# Patient Record
Sex: Male | Born: 1945
Health system: Southern US, Academic
[De-identification: ages and names within clinical notes are randomized; demographics above are authoritative.]

## PROBLEM LIST (undated history)

## (undated) DIAGNOSIS — R634 Abnormal weight loss: Secondary | ICD-10-CM

## (undated) DIAGNOSIS — R197 Diarrhea, unspecified: Secondary | ICD-10-CM

## (undated) DIAGNOSIS — I10 Essential (primary) hypertension: Secondary | ICD-10-CM

## (undated) DIAGNOSIS — E7800 Pure hypercholesterolemia, unspecified: Secondary | ICD-10-CM

## (undated) DIAGNOSIS — Z9889 Other specified postprocedural states: Secondary | ICD-10-CM

## (undated) DIAGNOSIS — K589 Irritable bowel syndrome without diarrhea: Secondary | ICD-10-CM

## (undated) DIAGNOSIS — R0989 Other specified symptoms and signs involving the circulatory and respiratory systems: Secondary | ICD-10-CM

## (undated) DIAGNOSIS — M25551 Pain in right hip: Secondary | ICD-10-CM

## (undated) DIAGNOSIS — M1990 Unspecified osteoarthritis, unspecified site: Secondary | ICD-10-CM

## (undated) DIAGNOSIS — M79672 Pain in left foot: Secondary | ICD-10-CM

## (undated) DIAGNOSIS — M542 Cervicalgia: Secondary | ICD-10-CM

## (undated) DIAGNOSIS — C801 Malignant (primary) neoplasm, unspecified: Secondary | ICD-10-CM

## (undated) DIAGNOSIS — I2510 Atherosclerotic heart disease of native coronary artery without angina pectoris: Secondary | ICD-10-CM

## (undated) DIAGNOSIS — R899 Unspecified abnormal finding in specimens from other organs, systems and tissues: Secondary | ICD-10-CM

## (undated) DIAGNOSIS — F419 Anxiety disorder, unspecified: Secondary | ICD-10-CM

## (undated) DIAGNOSIS — N400 Enlarged prostate without lower urinary tract symptoms: Secondary | ICD-10-CM

## (undated) DIAGNOSIS — R0789 Other chest pain: Secondary | ICD-10-CM

## (undated) DIAGNOSIS — R5383 Other fatigue: Secondary | ICD-10-CM

## (undated) DIAGNOSIS — F32A Depression, unspecified: Secondary | ICD-10-CM

## (undated) DIAGNOSIS — D72829 Elevated white blood cell count, unspecified: Secondary | ICD-10-CM

## (undated) DIAGNOSIS — I7090 Unspecified atherosclerosis: Secondary | ICD-10-CM

## (undated) DIAGNOSIS — K219 Gastro-esophageal reflux disease without esophagitis: Secondary | ICD-10-CM

## (undated) DIAGNOSIS — R202 Paresthesia of skin: Secondary | ICD-10-CM

## (undated) DIAGNOSIS — E041 Nontoxic single thyroid nodule: Secondary | ICD-10-CM

## (undated) DIAGNOSIS — G4733 Obstructive sleep apnea (adult) (pediatric): Secondary | ICD-10-CM

## (undated) DIAGNOSIS — M79659 Pain in unspecified thigh: Secondary | ICD-10-CM

## (undated) DIAGNOSIS — M5116 Intervertebral disc disorders with radiculopathy, lumbar region: Secondary | ICD-10-CM

## (undated) DIAGNOSIS — G47419 Narcolepsy without cataplexy: Secondary | ICD-10-CM

## (undated) DIAGNOSIS — R972 Elevated prostate specific antigen [PSA]: Secondary | ICD-10-CM

## (undated) HISTORY — DX: Benign prostatic hyperplasia without lower urinary tract symptoms: N40.0

## (undated) HISTORY — DX: Other chest pain: R07.89

## (undated) HISTORY — PX: HX COLONOSCOPY: 2100001147

## (undated) HISTORY — DX: Elevated white blood cell count, unspecified: D72.829

## (undated) HISTORY — DX: Gastro-esophageal reflux disease without esophagitis: K21.9

## (undated) HISTORY — PX: HX LAP CHOLECYSTECTOMY: SHX56

## (undated) HISTORY — DX: Other fatigue: R53.83

## (undated) HISTORY — DX: Pure hypercholesterolemia, unspecified: E78.00

## (undated) HISTORY — DX: Intervertebral disc disorders with radiculopathy, lumbar region: M51.16

## (undated) HISTORY — DX: Anxiety disorder, unspecified: F41.9

## (undated) HISTORY — DX: Pain in right hip: M25.551

## (undated) HISTORY — DX: Unspecified abnormal finding in specimens from other organs, systems and tissues: R89.9

## (undated) HISTORY — DX: Cervicalgia: M54.2

## (undated) HISTORY — PX: DENTAL SURGERY: SHX609

## (undated) HISTORY — DX: Obstructive sleep apnea (adult) (pediatric): G47.33

## (undated) HISTORY — DX: Paresthesia of skin: R20.2

## (undated) HISTORY — DX: Pain in unspecified thigh: M79.659

## (undated) HISTORY — PX: ORTHOPEDIC SURGERY: SHX850

## (undated) HISTORY — DX: Unspecified atherosclerosis: I70.90

## (undated) HISTORY — DX: Other specified symptoms and signs involving the circulatory and respiratory systems: R09.89

## (undated) HISTORY — DX: Pain in left foot: M79.672

## (undated) HISTORY — PX: HX HERNIA REPAIR: SHX51

## (undated) HISTORY — DX: Abnormal weight loss: R63.4

## (undated) HISTORY — DX: Elevated prostate specific antigen (PSA): R97.20

## (undated) HISTORY — PX: HX CORONARY ARTERY BYPASS GRAFT: SHX141

## (undated) HISTORY — DX: Unspecified osteoarthritis, unspecified site: M19.90

## (undated) HISTORY — DX: Atherosclerotic heart disease of native coronary artery without angina pectoris: I25.10

## (undated) HISTORY — DX: Depression, unspecified: F32.A

## (undated) HISTORY — DX: Diarrhea, unspecified: R19.7

## (undated) HISTORY — PX: HX BACK SURGERY: SHX140

## (undated) HISTORY — DX: Essential (primary) hypertension: I10

## (undated) HISTORY — DX: Narcolepsy without cataplexy: G47.419

## (undated) HISTORY — DX: Nontoxic single thyroid nodule: E04.1

## (undated) HISTORY — DX: Other specified postprocedural states: Z98.890

## (undated) HISTORY — DX: Irritable bowel syndrome, unspecified: K58.9

---

## 1993-01-21 ENCOUNTER — Inpatient Hospital Stay (HOSPITAL_COMMUNITY): Payer: Self-pay

## 2009-10-26 ENCOUNTER — Ambulatory Visit
Admission: RE | Admit: 2009-10-26 | Discharge: 2009-10-26 | Disposition: A | Discharge status: Home / Self Care | Payer: Self-pay | Attending: Dermatology | Admitting: Dermatology

## 2009-10-27 NOTE — Progress Notes (Signed)
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  He is being referred by Dr. Illene Bolus of [place], [place], for Mohs micrographic surgery.    NAME OF PROCEDURE:  Mohs micrographic surgery in order to excise a basal cell carcinoma from the left ear with possible reconstruction of the defect.    POSTOPERATIVE DIAGNOSIS:  Three layers of Mohs micrographic surgery in order to excise a basal cell carcinoma from the left ear and an incidental squamous cell carcinoma in situ followed by reconstruction of the defect with a full-thickness skin graft harvested from the left supraclavicular area.     OPERATIVE PROCEDURE:  Prior to surgery, the patient was advised of the risks, benefits, alternative methods of treatment and consequences of lack of treatment.  He said he understood the above and was willing to undergo the following procedure.  On Oct 26, 2009, at 7:56 a.m., a timeout was taken.  The patient identified himself.  I, with the patient's assistance and staff being present, identified the surgical site.  It was a 10 x 9-mm sclerotic area that had previously been biopsied and was diagnosed as a basal cell carcinoma.  He indicated he wished to undergo the Mohs procedure.  Therefore, following sterile prep and a total of 10 mL of 1% Xylocaine with 1:100,000 epinephrine, the following procedure was performed.  There was also preparation of anesthetic, which was included to the left supraclavicular area, which will be the future donor site for a full-thickness skin graft.  The area of gross tumor involvement was excised with a 1-3 mm margin of tissue as indicated on the map in the chart.  After the first layer of Mohs micrographic surgery, there was evidence of some scar tissue and basal cell carcinoma as indicated on the map in the chart.  These 2 areas were reexcised in 1 specimen in the second layer with a 1-3 mm margin of tissue.  After the second layer of Mohs micrographic surgery the basal cell carcinoma had resolved, but there was a focus of squamous cell carcinoma in situ as indicated on the map in the chart.  This area was reexcised in layer #3 with less than a 1-3 mm margin of tissue.  After the third layer of Mohs micrographic surgery, there were some mild actinic keratotic changes.  There was no evidence of residual squamous cell carcinoma or in situ or basal cell carcinoma.  I explained the significance of subclinical identification of actinic keratotic changes to the patient.  He agreed to be followed.  Therefore, hemostasis was obtained and the 22 x 18-mm defect was reconstructed with a  full-thickness skin graft.  A template of Telfa was made of the defect.  The template was placed over the left supraclavicular area.  This outlined area was incised and defatted and pie  crusted in 3 areas.  The graft was then trimmed to fit into place and sutured to the wound bed over the left ear defect with three 6-0 fast-absorbing gut basting sutures.  The periphery of the graft was sutured into place with running 6-0 nylon.  Following this, bacitracin ointment was placed over the graft followed by a bolster of Xeroform gauze, which was secured with 5-0 nylon in a crisscross pattern.  The donor site was approximated by wide undermining and approximation with multiple 4-0 buried Vicryl sutures.  The surface was closed with running 6-0 fast-absorbing gut sutures.  The procedure was performed without incident.  The patient is to restrain from any strenuous activity.  He will see Dr. Rebecca Eaton in approximately 5-7 days for his bolster removal.    I performed the procedure with the assistance of Dr. Laveda Norman.  I was present during the entire procedure and I dictated the note and I agree with the above.    SURGEON:  Evalee Mutton, MD.    ASSISTANTS:  Laveda Norman MD, Victoriano Lain RN and Amanda Cockayne HT (ASCP).      Evalee Mutton, MD  Professor  [facility] Department of Medicine    [id number]; D: 10/26/2009 12:15:03 T: 10/27/2009 08:16:07

## 2016-07-05 IMAGING — CR XRAY KNEE 3 VIEWS LT
1 series · 3 of 3 positions shown · non-contrast
Comparison: None.

Exam:   

Left knee 3V
INDICATION: Pain.

[Series 4: view not recorded · 0.17mm/px · 3 of 3 slices shown]
[im 1/3]
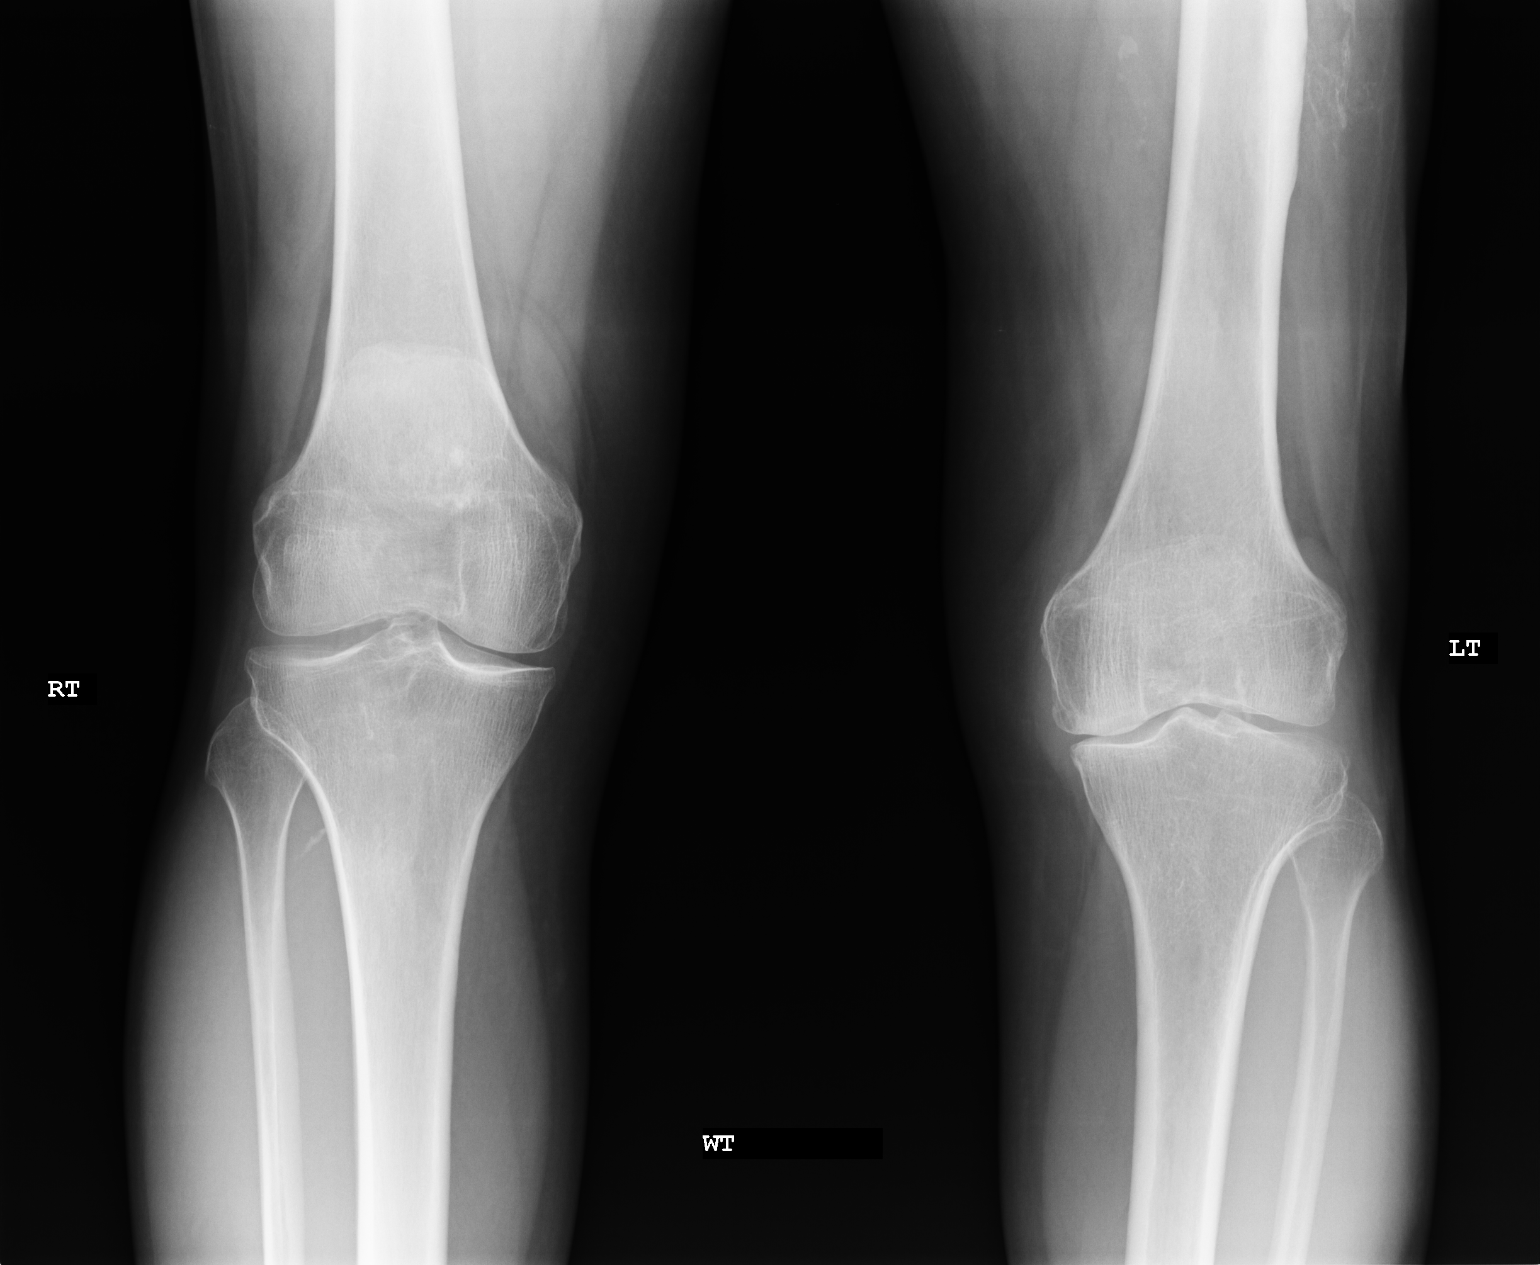
[im 2/3]
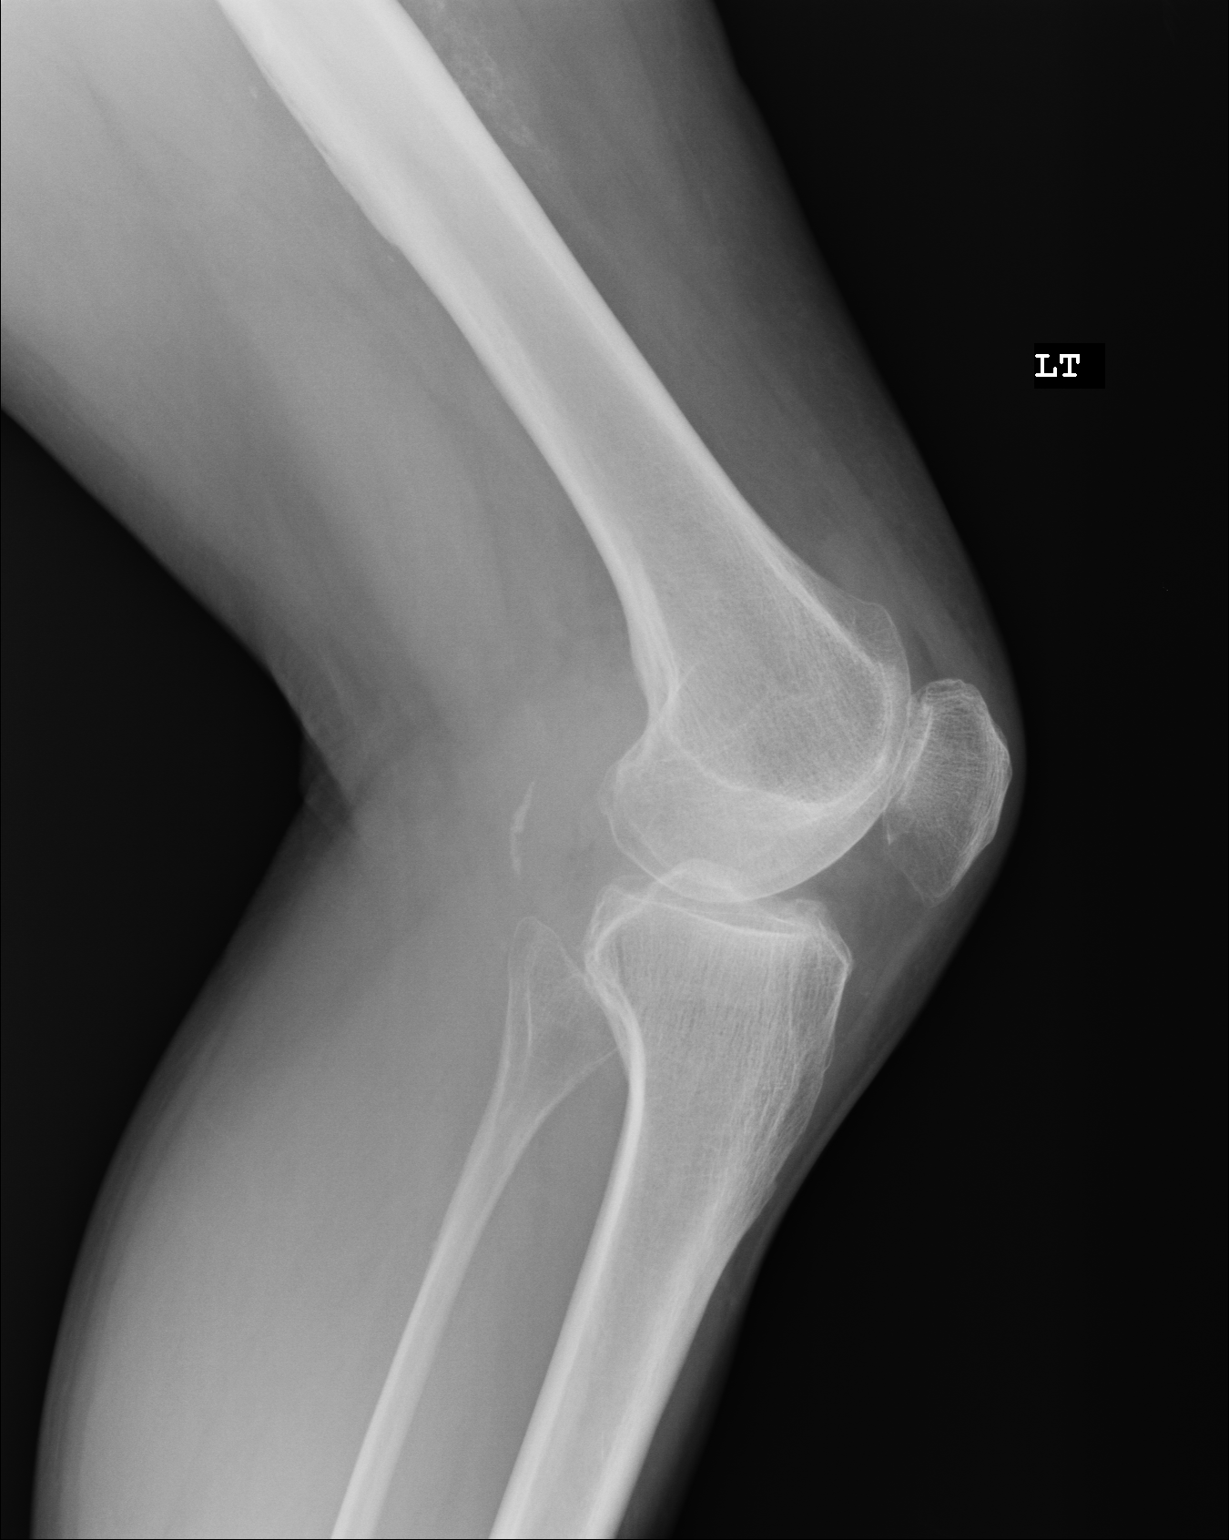
[im 3/3]
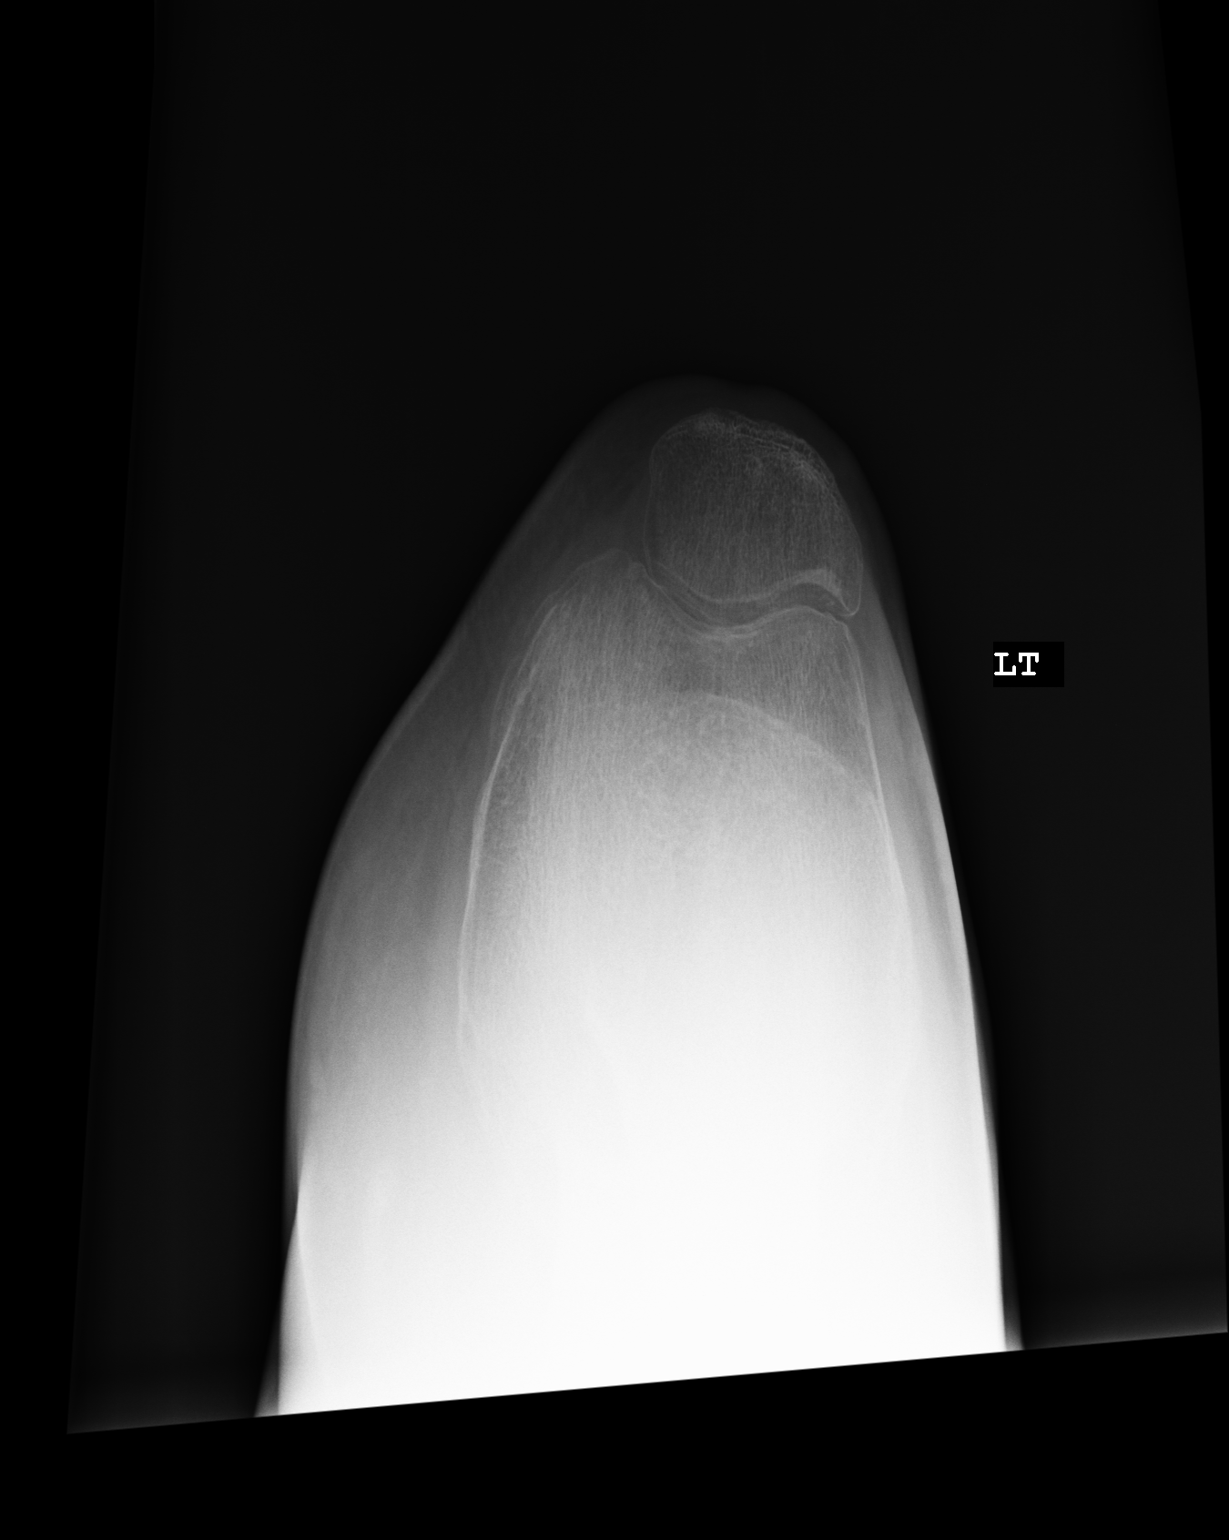

[3 of 3 positions shown; findings below may reference images not displayed]

FINDINGS: There is no acute fracture or subluxation. There is moderate narrowing of the medial tibiofemoral articulation and mild narrowing of the patellofemoral joint. There is no suprapatellar effusion. There is no soft tissue abnormality.
IMPRESSION: Degenerative changes as detailed above.

## 2016-07-05 IMAGING — CR XRAY KNEE 3 VIEWS RT
1 series · 3 of 3 positions shown · non-contrast
Comparison: 

Exam:   

Right knee 3V
INDICATION: Pain.

[Series 1: view not recorded · 0.17mm/px · 3 of 3 slices shown]
[im 1/3]
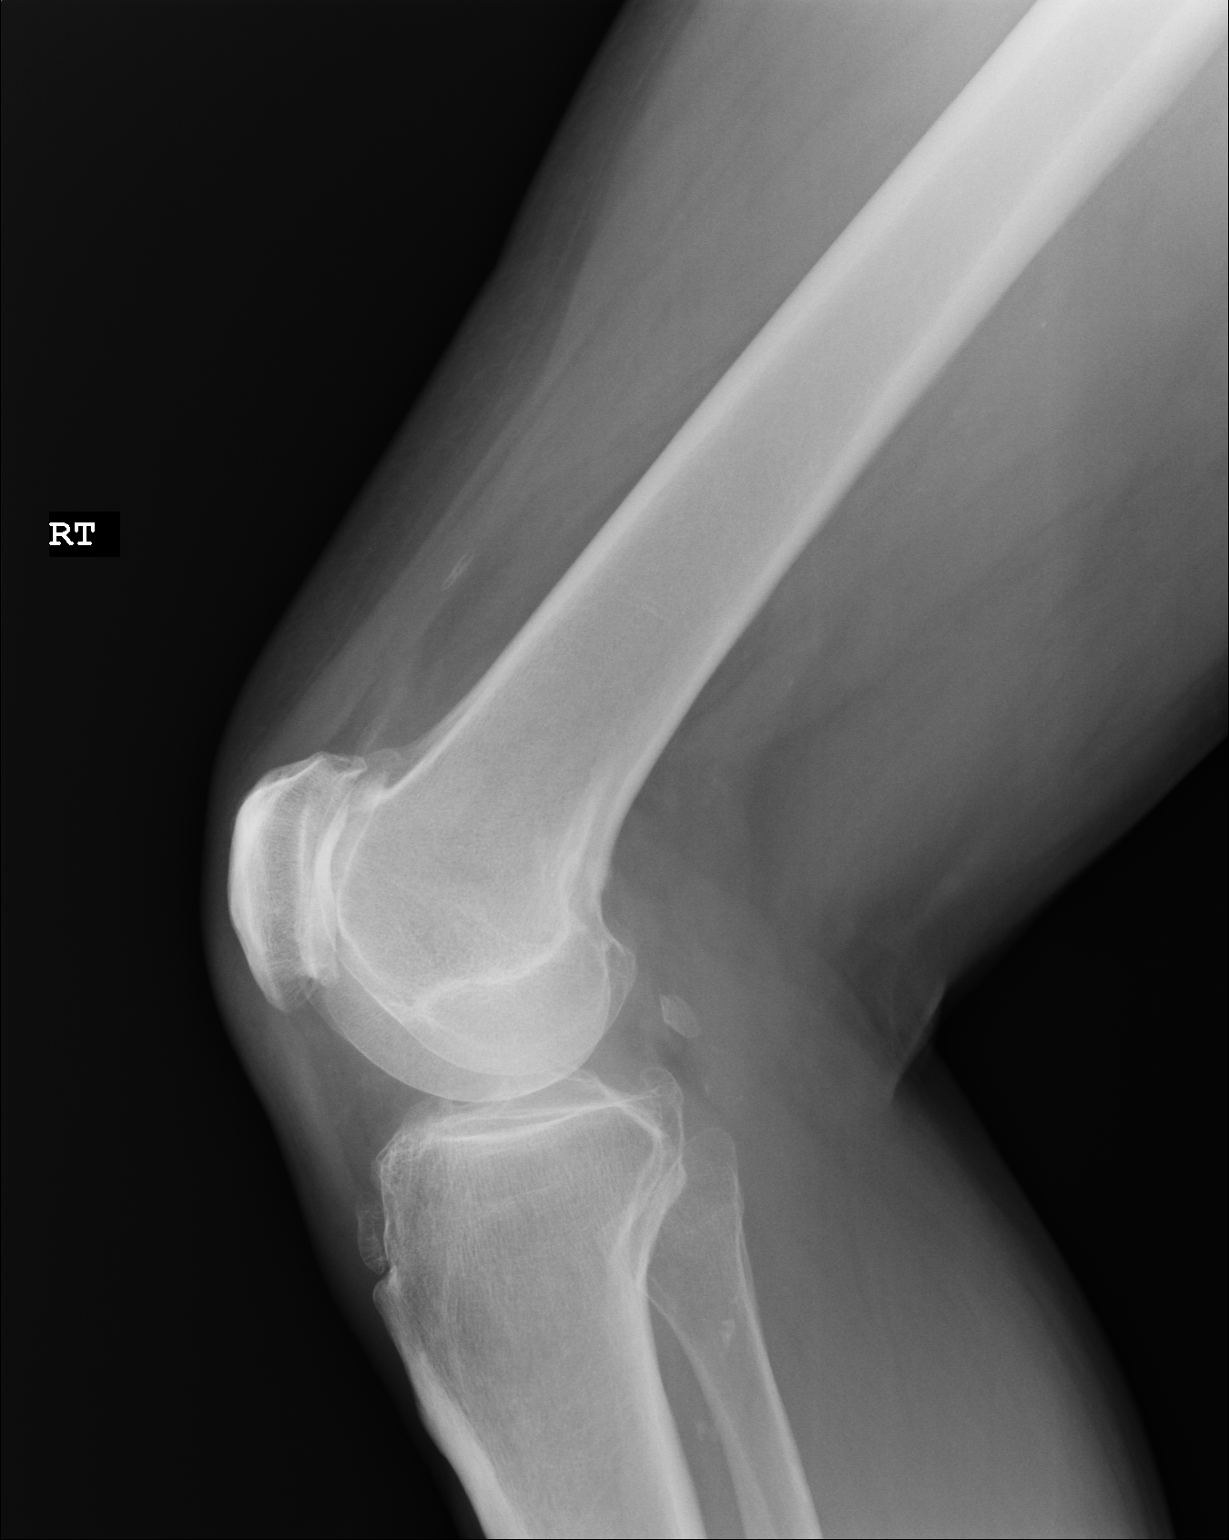
[im 2/3]
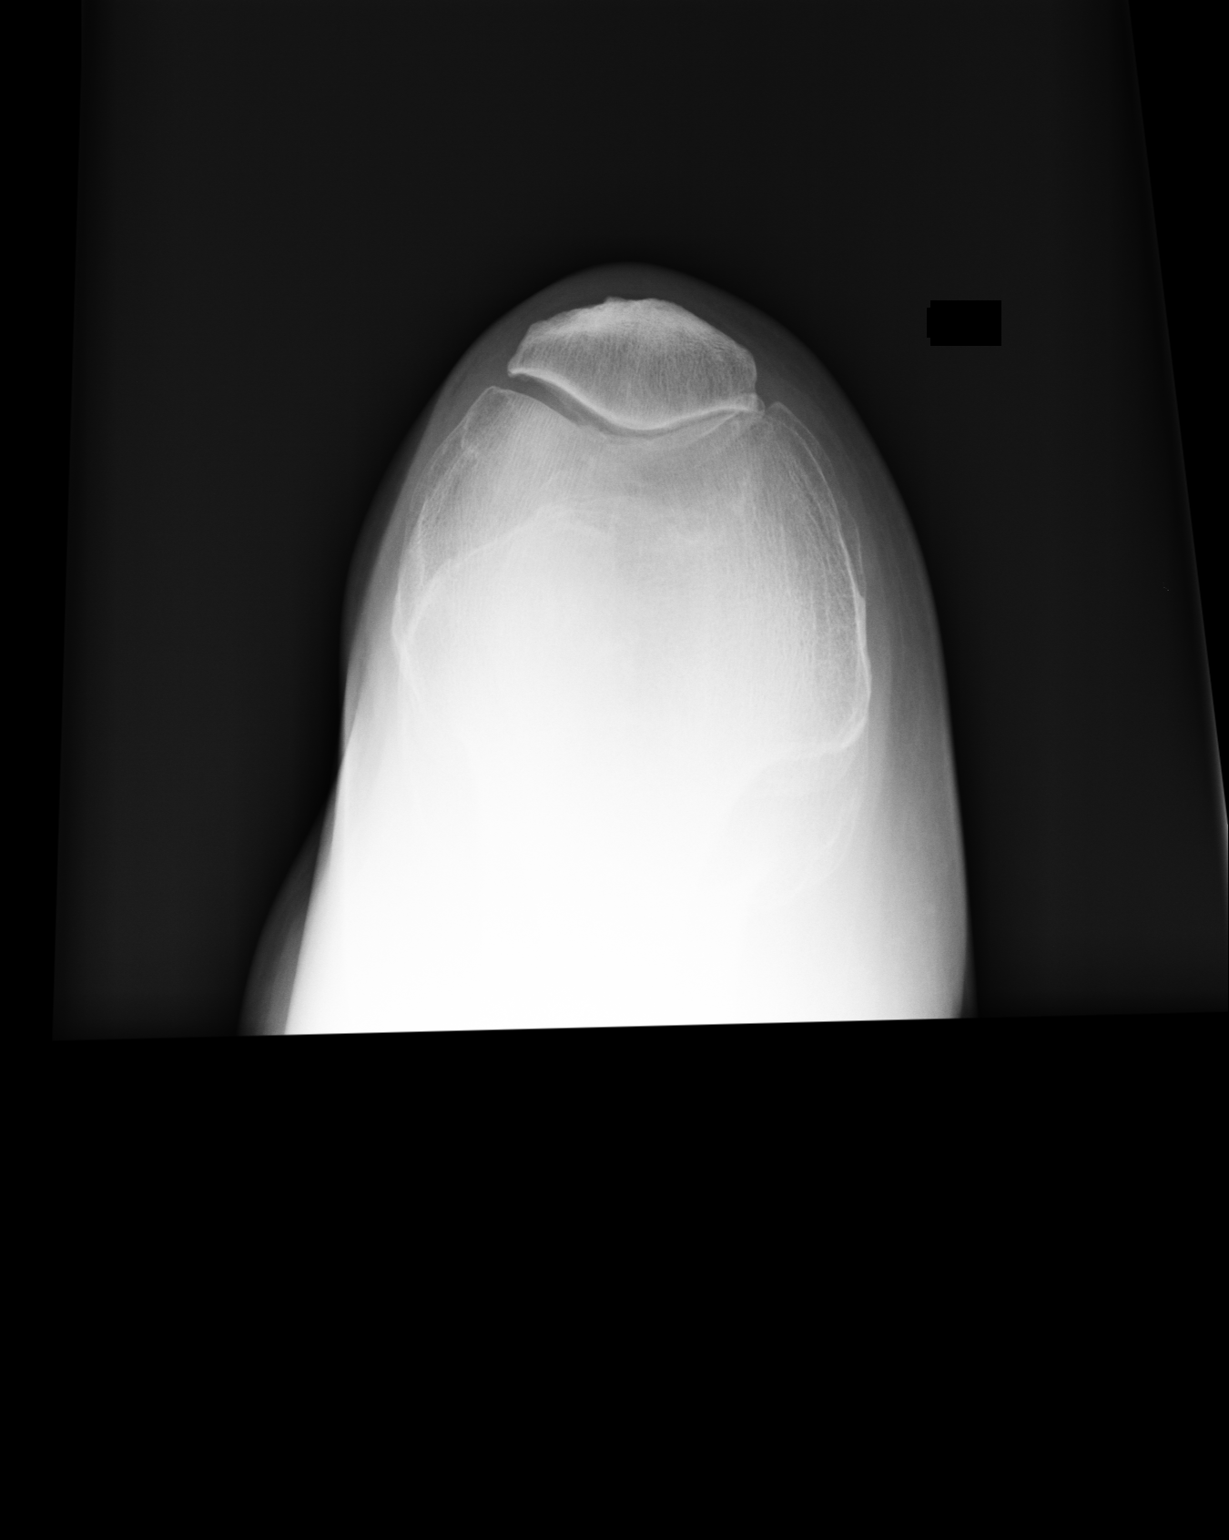
[im 3/3]
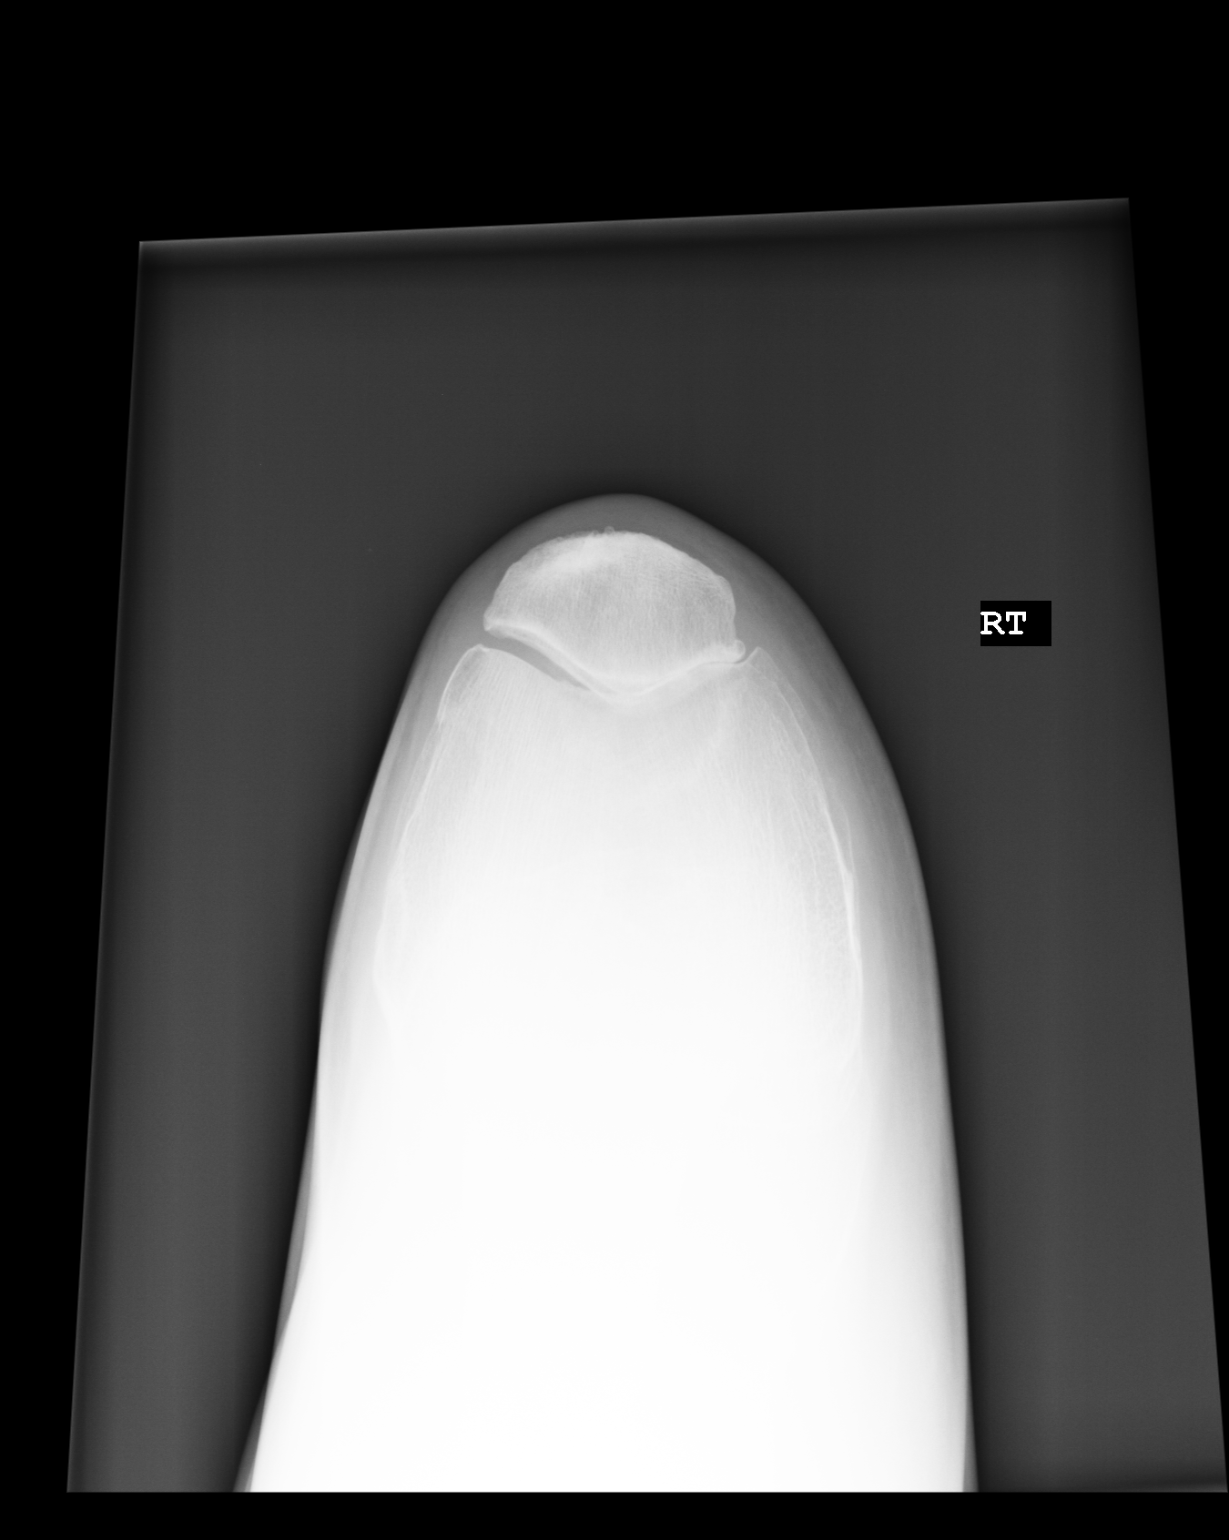

[3 of 3 positions shown; findings below may reference images not displayed]

FINDINGS: There is no acute fracture or subluxation. There is moderate to severe narrowing of the patellofemoral articulation. There is no suprapatellar effusion. There is no soft tissue abnormality.
IMPRESSION: Moderate to severe narrowing of the patellofemoral articulation.

## 2017-01-31 IMAGING — CT CT THORAX W/O CONTRAST
2 of 4 series · 15 of 36 positions shown, 18 images · non-contrast
Comparison: none

Exam:  CT THORAX W/O CONTRAST
INDICATION: Chest pain.  

Radiation DLP 547 mGy cm.

[cor · coronal · 0.80mm/px · 3 of 46 slices shown]
[im 10/46  lung]
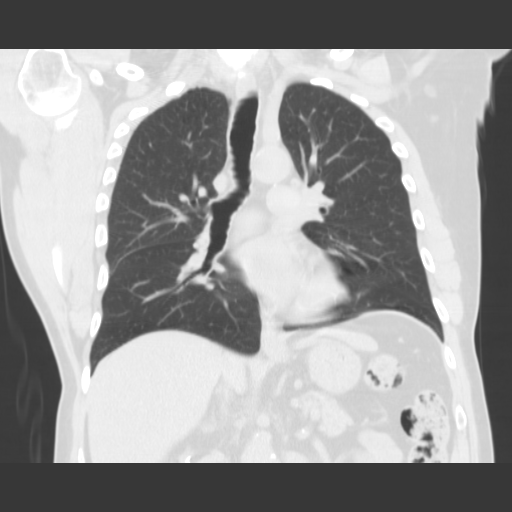
[im 19/46  lung]
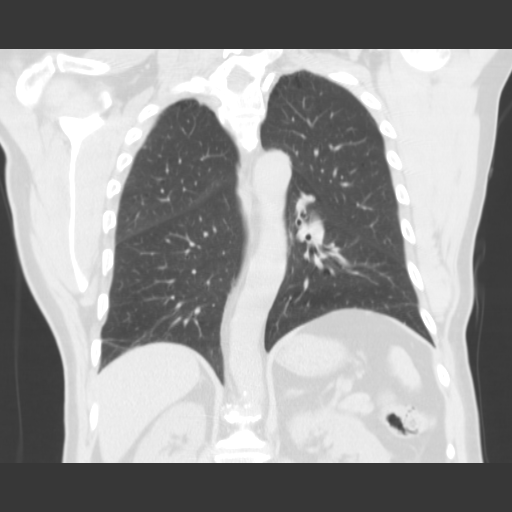
[im 28/46  lung]
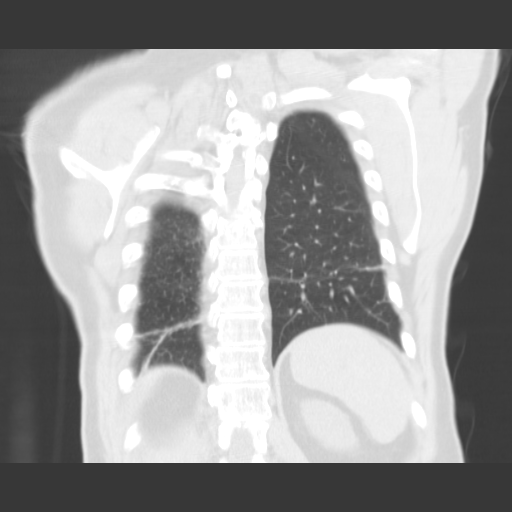

[lung · axial · 0.80mm/px · z∈[-94,+188]mm · 12 of 110 slices shown, 15 images]
[im 8/110  mediastinal]
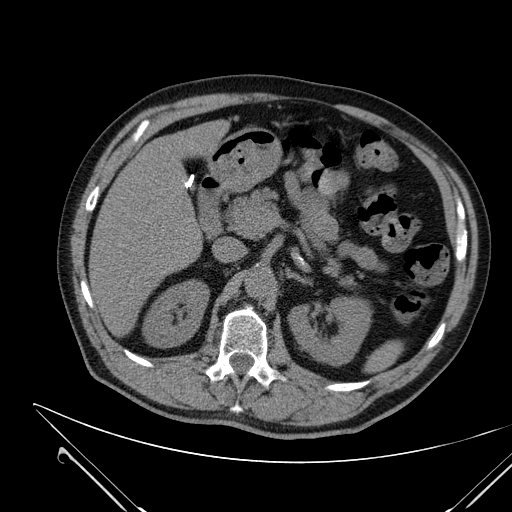
[im 8/110  lung]
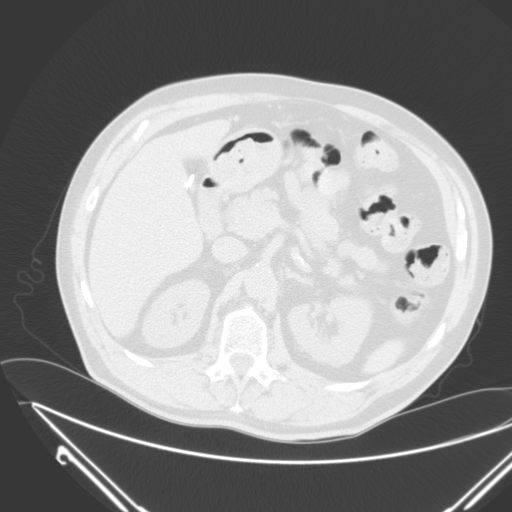
[im 16/110  lung]
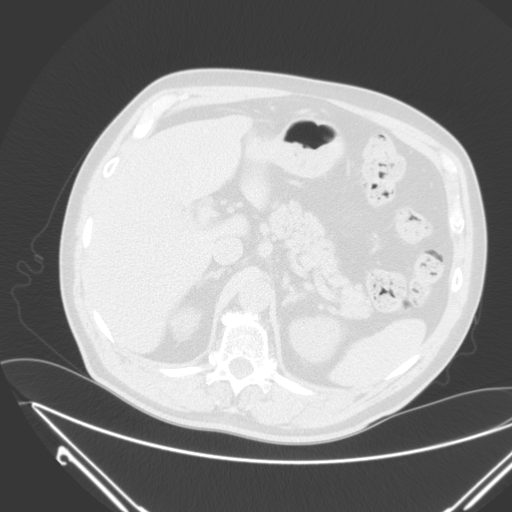
[im 24/110  lung]
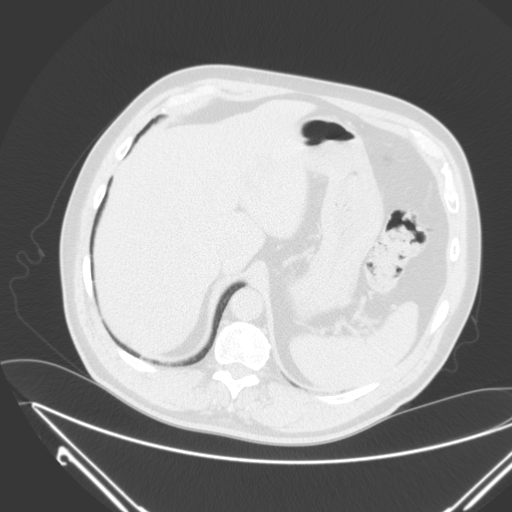
[im 32/110  lung]
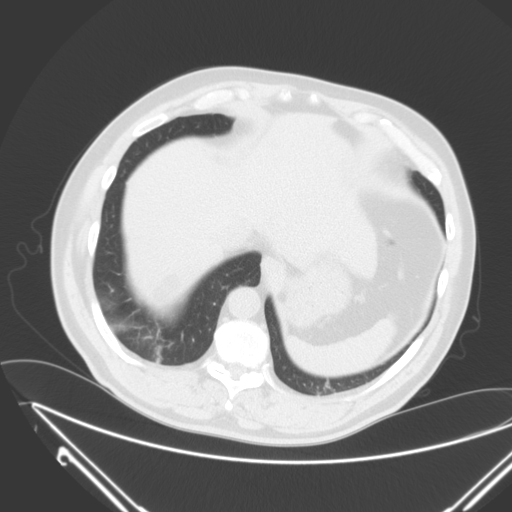
[im 39/110  mediastinal]
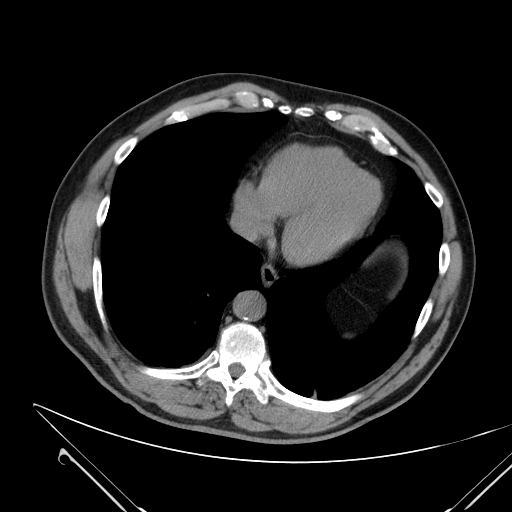
[im 39/110  lung]
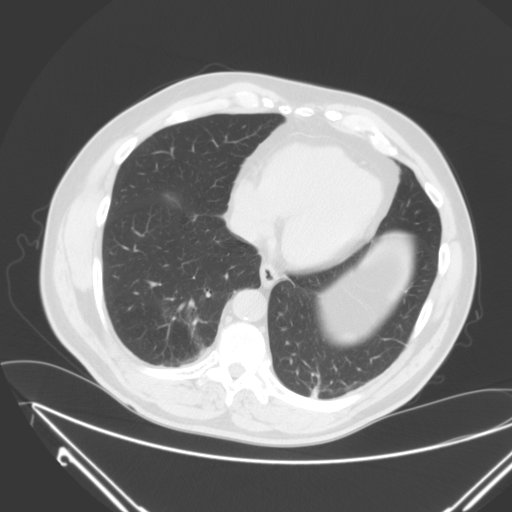
[im 47/110  lung]
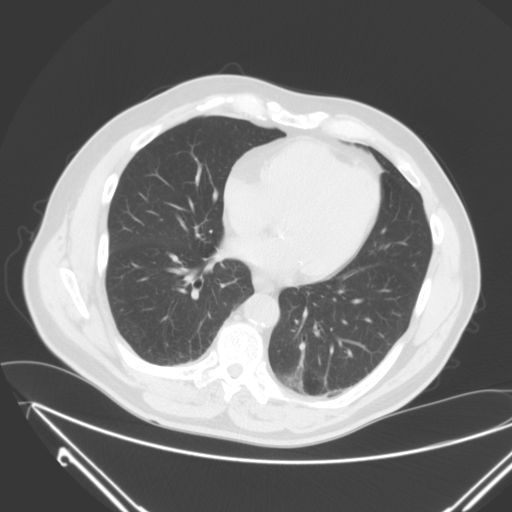
[im 63/110  lung]
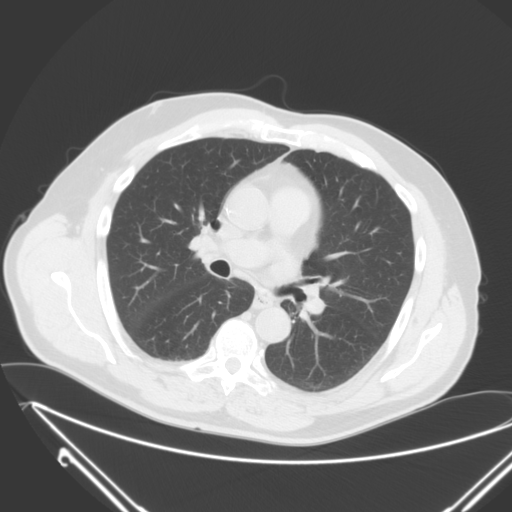
[im 71/110  lung]
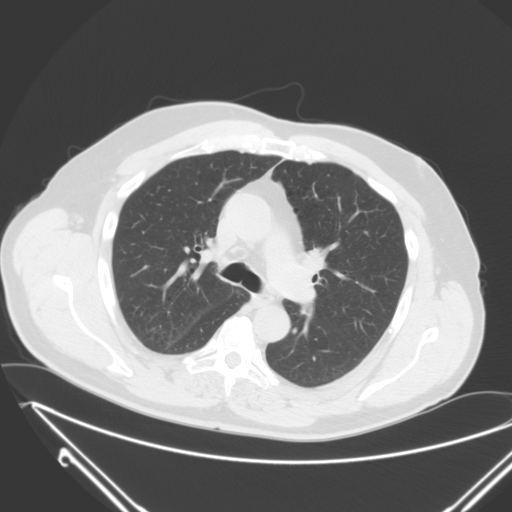
[im 78/110  mediastinal]
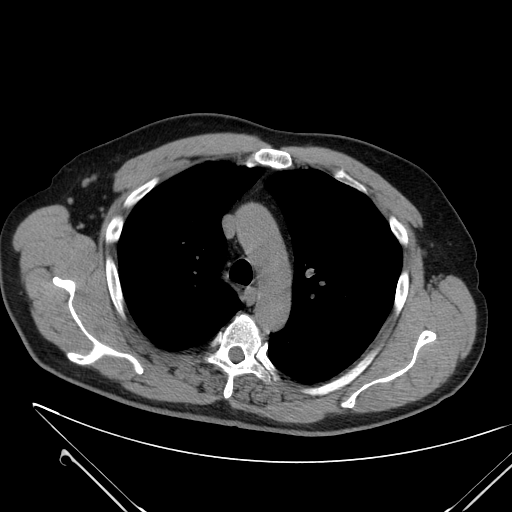
[im 78/110  lung]
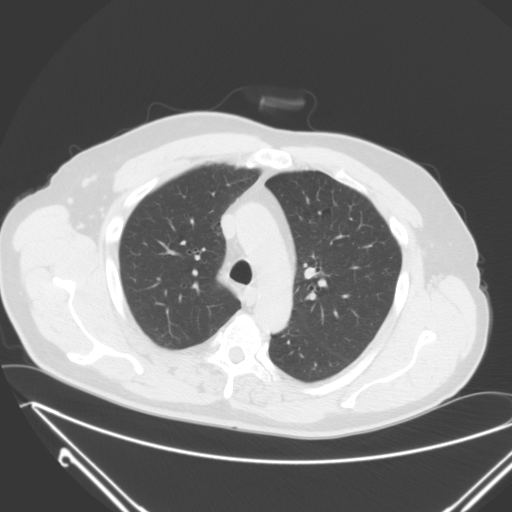
[im 86/110  lung]
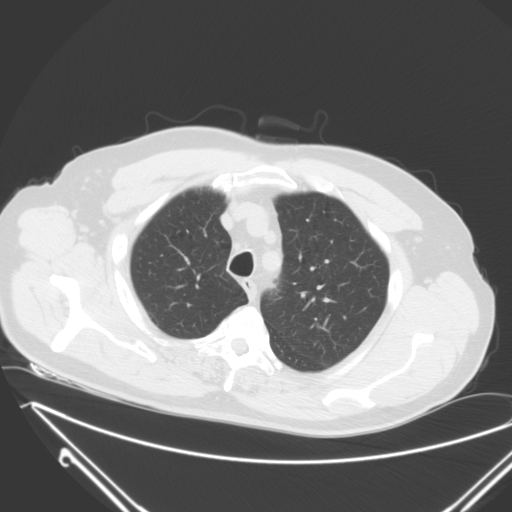
[im 94/110  lung]
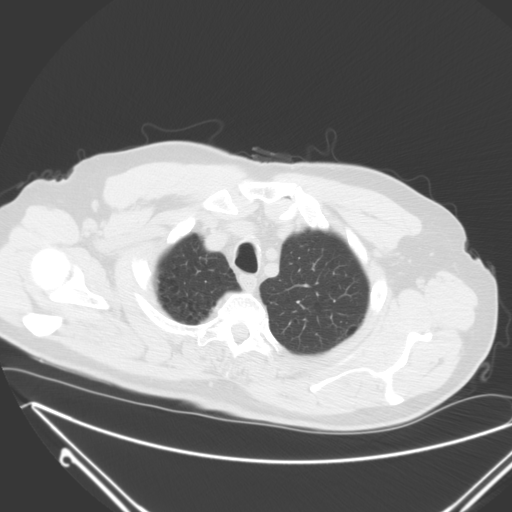
[im 102/110  lung]
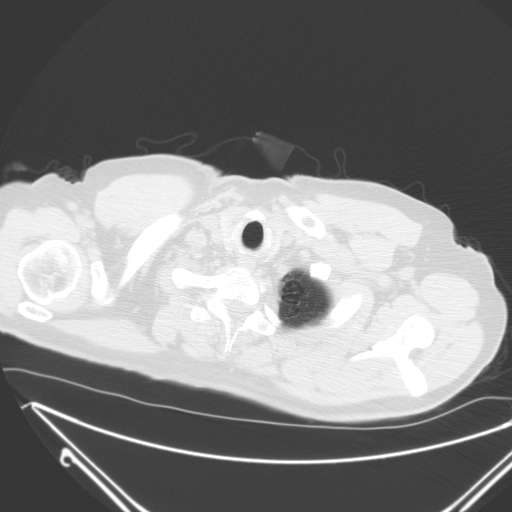

[15 of 36 positions shown; findings below may reference images not displayed]

FINDINGS: A CT scan of the patient’s chest is performed.  Non-contrast images shows no evidence for mediastinal adenopathy.  The hilar areas cannot be adequately evaluated.  No evidence for axillary adenopathy is seen.  The caliber of the thoracic aorta is within normal limits.  Coronary artery calcifications are noted.  No evidence for pericardial effusion is seen.  The visualized portions of the upper abdominal organs shows in the right lobe of the liver, a 2.6 cm septated low attenuation lesion.  In addition, in the left lobe of the liver, there is a small round focal area of diminished attenuation measuring 1.5 cm.  These may represent cysts or hemangiomas.  An ultrasound can confirm this finding.  The gallbladder is surgically absent.  Lung window images shows bilateral interstitial scarring in the posterior costophrenic angles.  There are emphysematous changes bilaterally.  No evidence for a pulmonary nodule, infiltrate, pleural effusion or pneumothorax.
IMPRESSION: No evidence for mediastinal adenopathy.

Two focal areas of diminished attenuation within the liver, follow up with ultrasound is recommended.  

Coronary artery calcifications.  

No evidence for a pulmonary nodule, infiltrate, pleural effusion or pneumothorax.

## 2017-02-08 IMAGING — US US ABDOMEN COMPLETE
1 series · 14 of 25 positions shown · non-contrast
Comparison: None.

Exam:  US ABDOMEN COMPLETE
INDICATION: Low density liver lesions seen on CT scan.

[Series 3: us abdomen complete · 0.27mm/px · 14 of 54 slices shown]
[im 1/54]
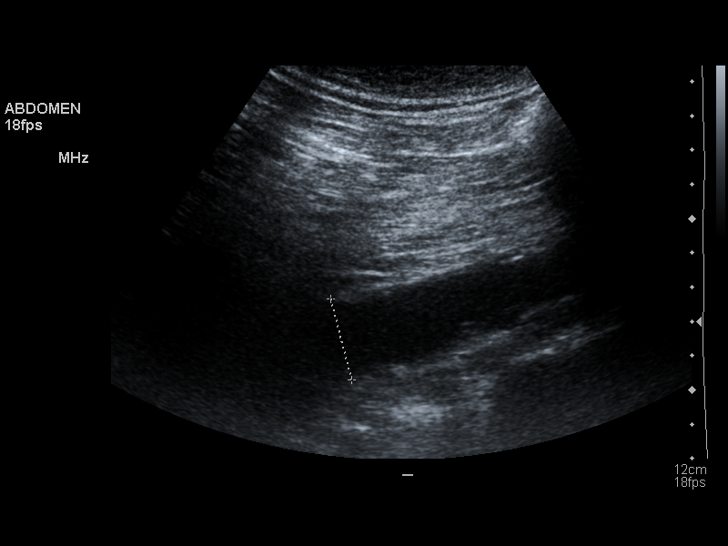
[im 5/54]
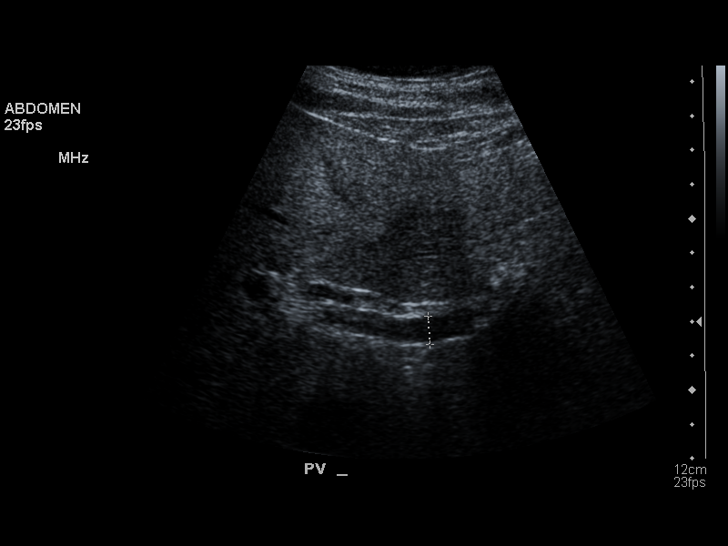
[im 9/54]
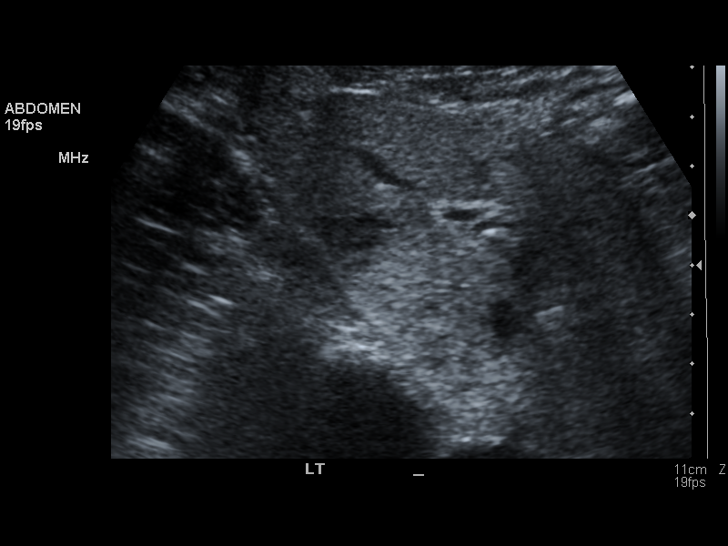
[im 14/54]
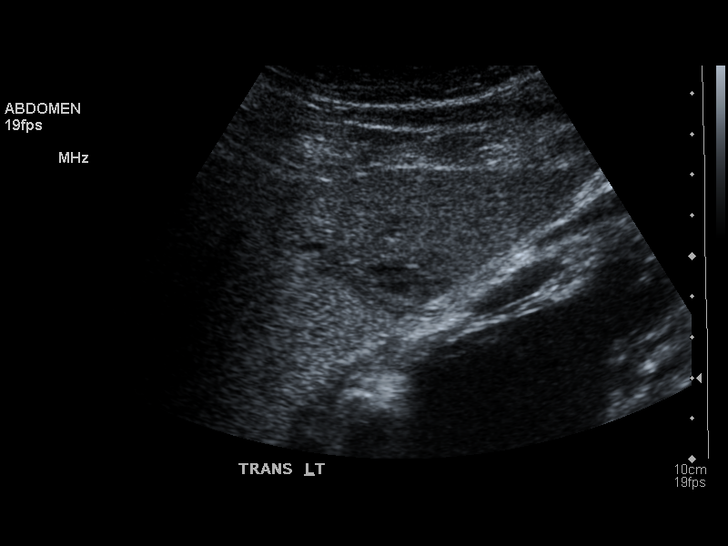
[im 18/54]
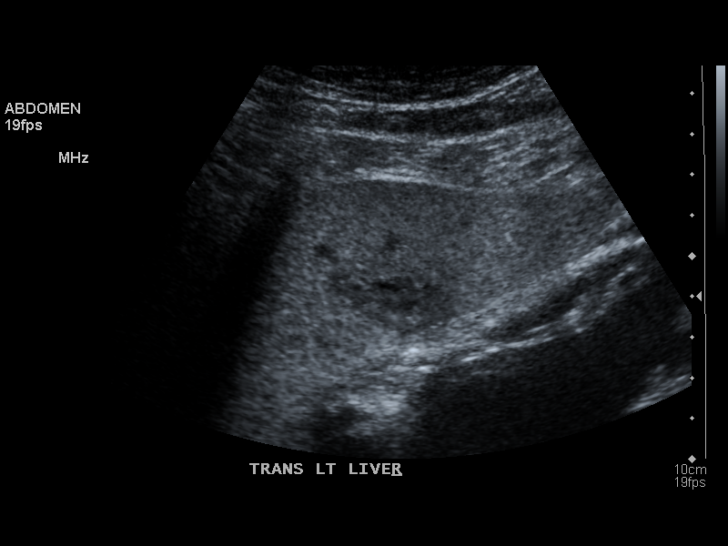
[im 20/54]
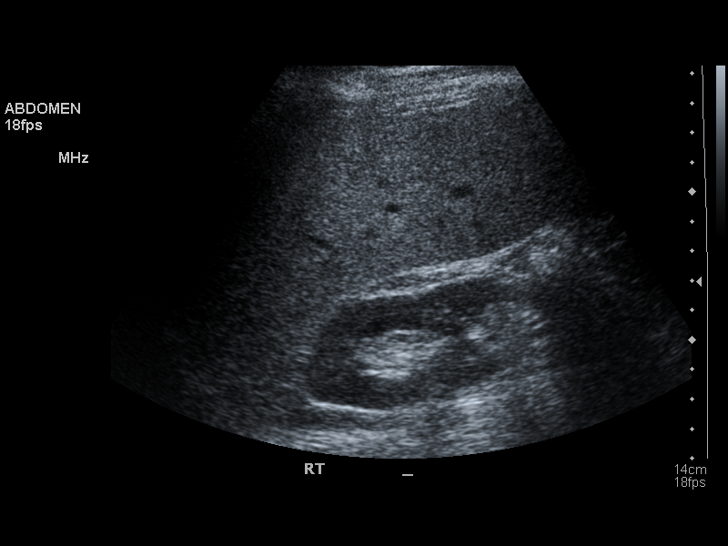
[im 25/54]
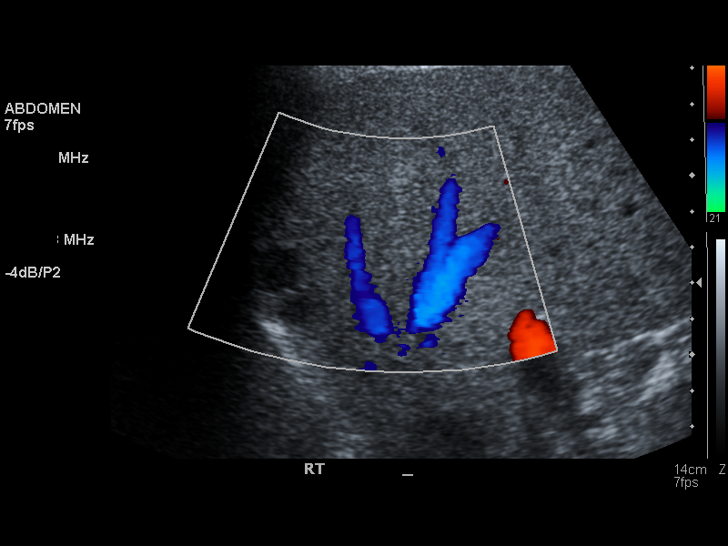
[im 29/54]
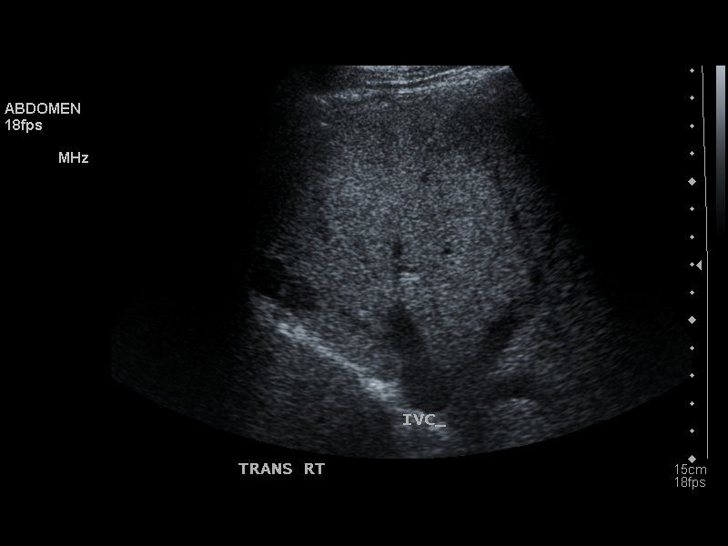
[im 34/54]
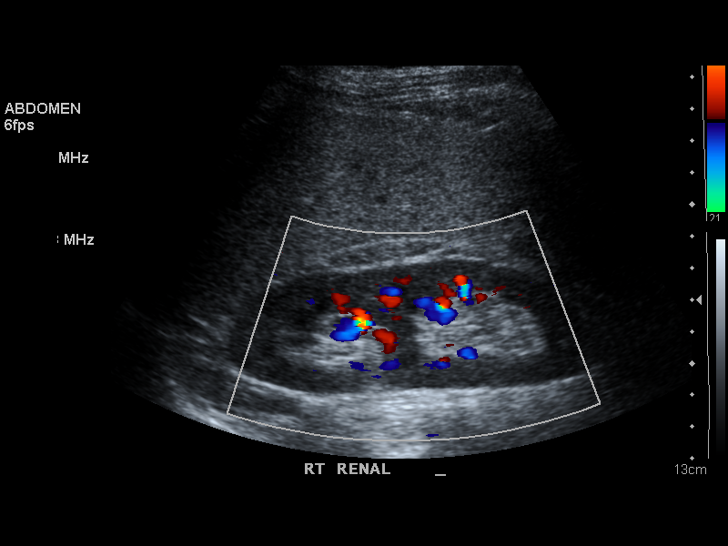
[im 36/54]
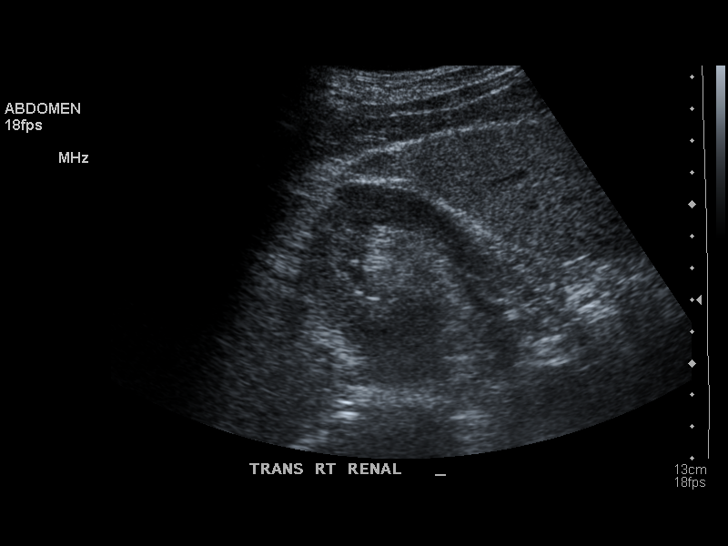
[im 40/54]
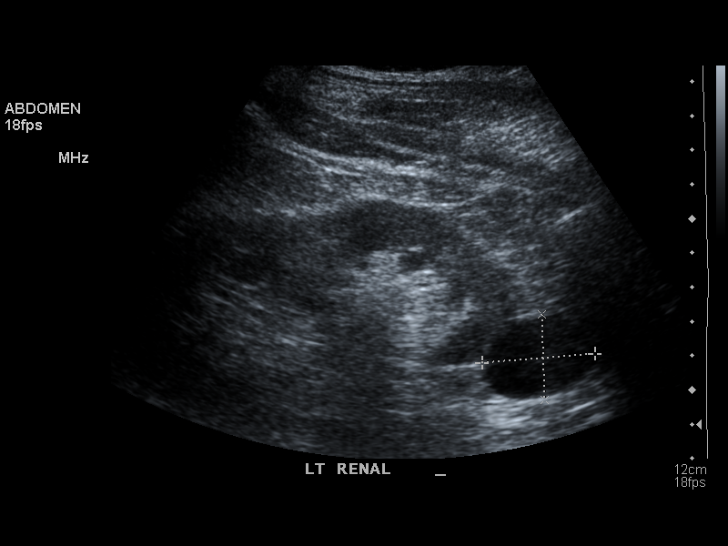
[im 45/54]
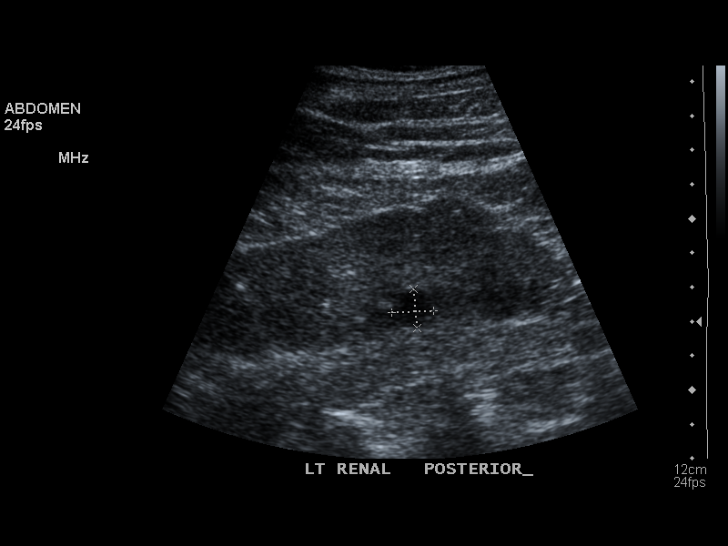
[im 49/54]
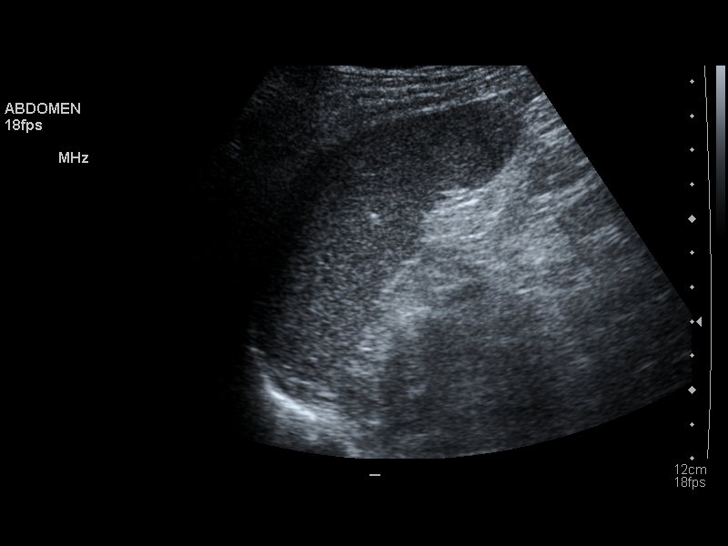
[im 54/54]
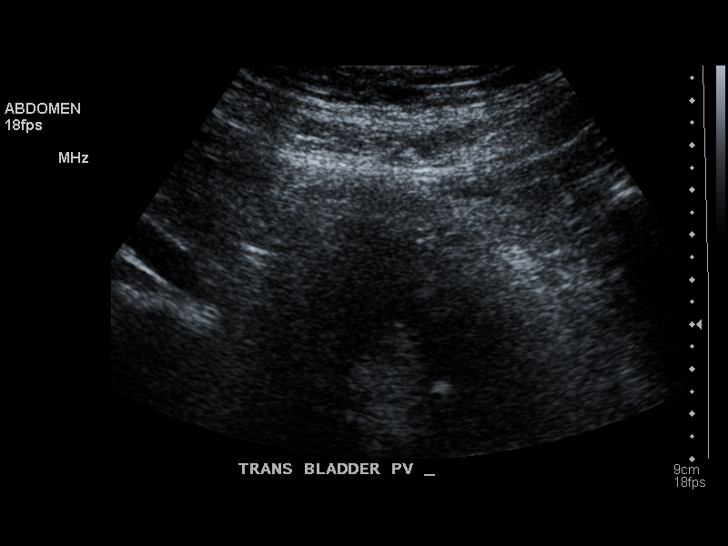

[14 of 25 positions shown; findings below may reference images not displayed]

FINDINGS: Liver is heterogeneous in echogenicity. There is a 16 mm hypoechoic area within the left hepatic lobe and another 27 mm hypoechoic area within the right hepatic lobe. There is no intra or extra hepatic biliary ductal dilatation. Common bile duct measures 6 mm. Gallbladder is surgically absent. Pancreas is not visualized due to artifact from overlying bowel gas. Spleen measures 11 cm and is normal. 

Kidneys re normal in echogenicity. Right kidney measures 12 cm and is unremarkable. Left kidney measures 13 cm and there are three left renal cysts, measuring up to 3.3 cm within the lower pole. 

Visualized abdominal aorta is without aneurysmal dilatation. IVC is normal. There is no ascites.
IMPRESSION: Indeterminate two hepatic lesions as detailed above. Followup contrast enhanced CT scan is recommended in three months for reassessment. 

Prior cholecystectomy. 

Pancreas incompletely visualized due to artifact from overlying bowel gas. 

Three left renal cysts.

## 2017-05-10 IMAGING — CT CT ABDOMEN W/O & W/CONTRAST
4 of 6 series · 12 of 46 positions shown, 19 images · IV contrast (CONTRAST)
Comparison: Abdominal sonogram dated 02/08/2017.

EXAM:  CT ABDOMEN W/O & W/CONTRAST
INDICATION: Follow-up liver lesions.
TECHNIQUE: Axial CT imaging of the abdomen was performed without and with 100 mL of Optiray 300 as per liver mass protocol. Oral contrast was also administered. Exam was performed using 1 or more of the following dose reduction techniques: Automated exposure control, adjustment of the mA and/or kV according to patient size, or the use of iterative reconstruction technique.

[axial · axial · 0.84mm/px · z∈[-1262,-1052]mm · 6 of 148 slices shown, 11 images]
[im 22/148  soft-tissue]
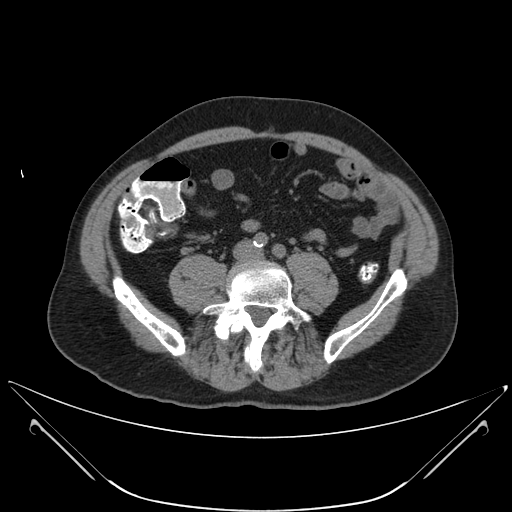
[im 22/148  bone]
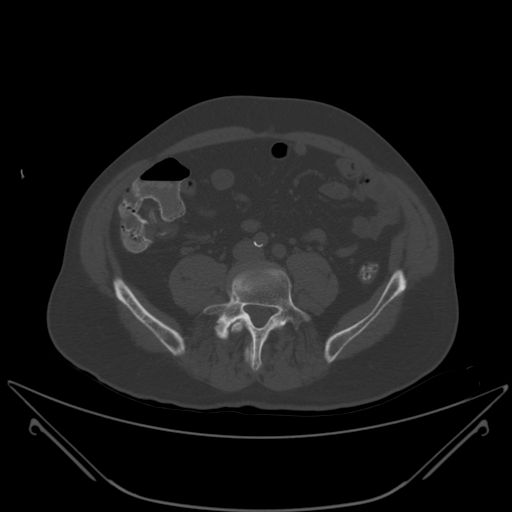
[im 43/148  soft-tissue]
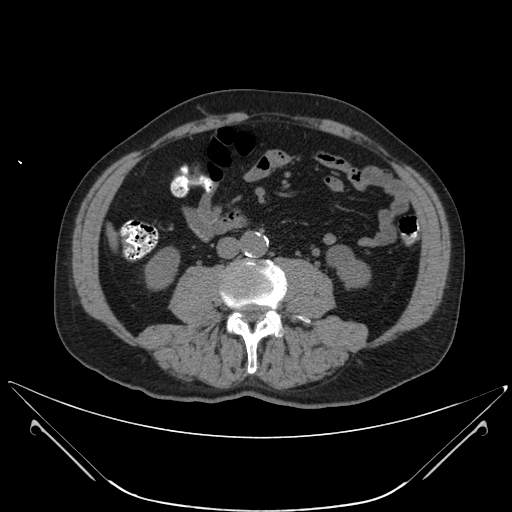
[im 64/148  soft-tissue]
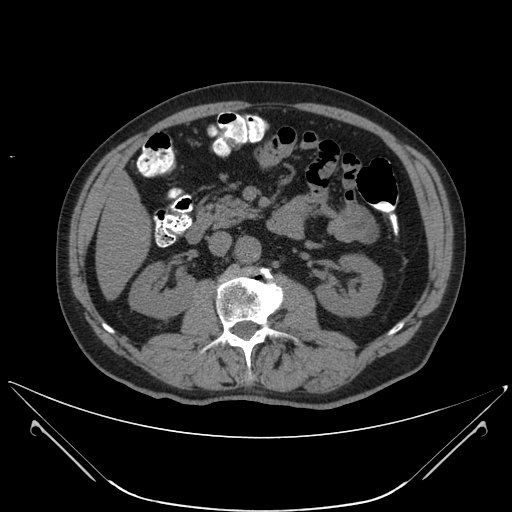
[im 64/148  lung]
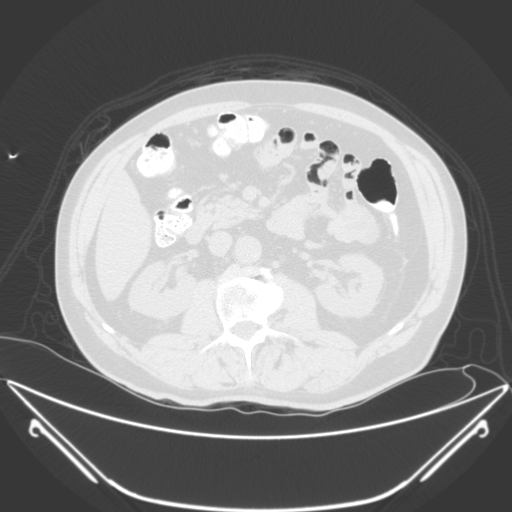
[im 85/148  soft-tissue]
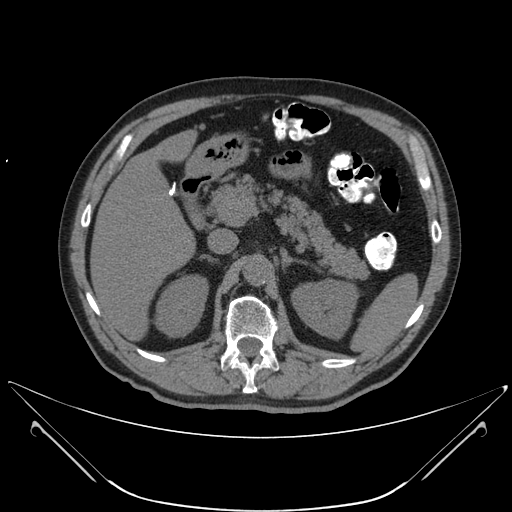
[im 85/148  lung]
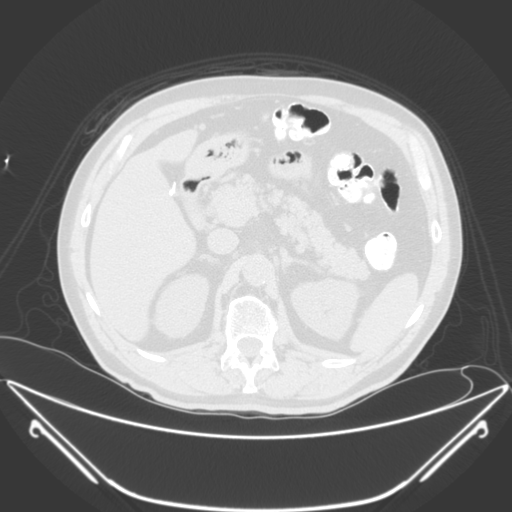
[im 106/148  soft-tissue]
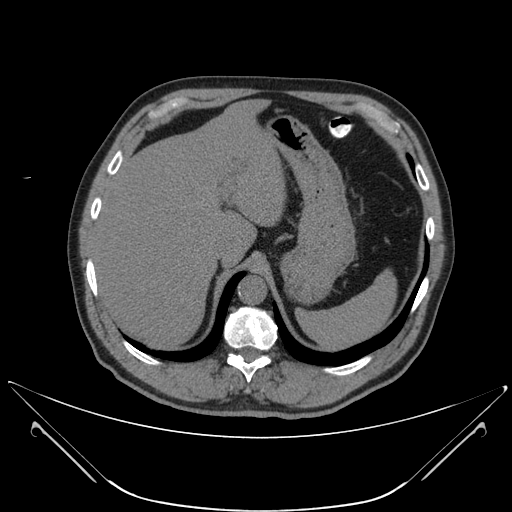
[im 106/148  lung]
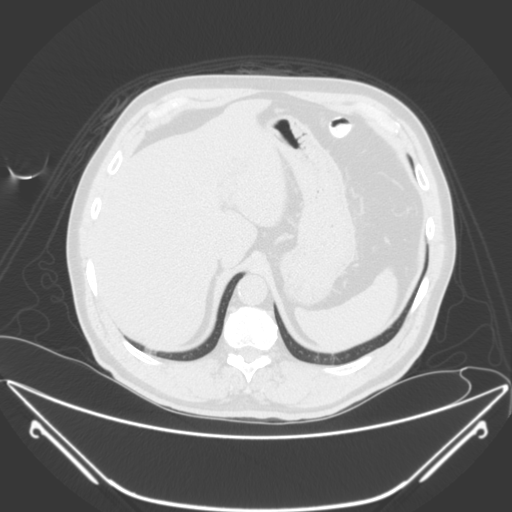
[im 127/148  soft-tissue]
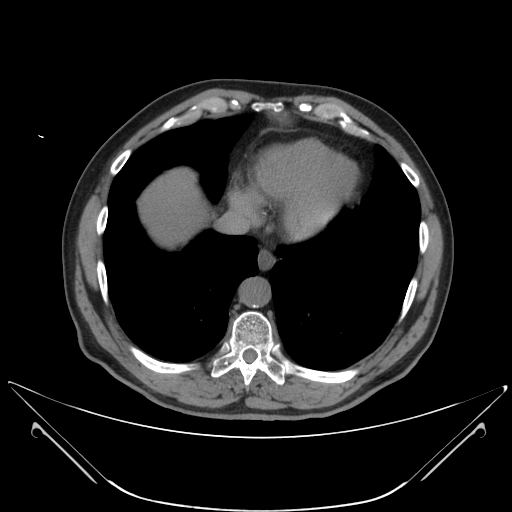
[im 127/148  lung]
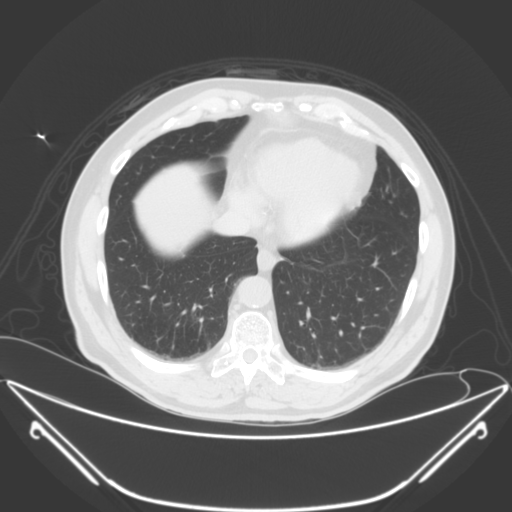

[arterial · axial · arterial · 0.84mm/px · z∈[-1260,-1218]mm · 2 of 149 slices shown]
[im 22/149  soft-tissue]
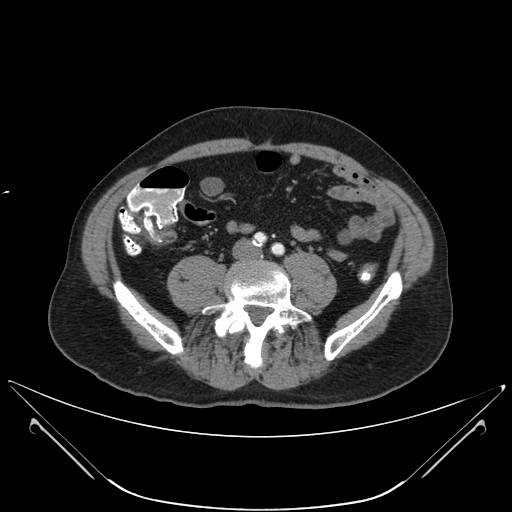
[im 43/149  soft-tissue]
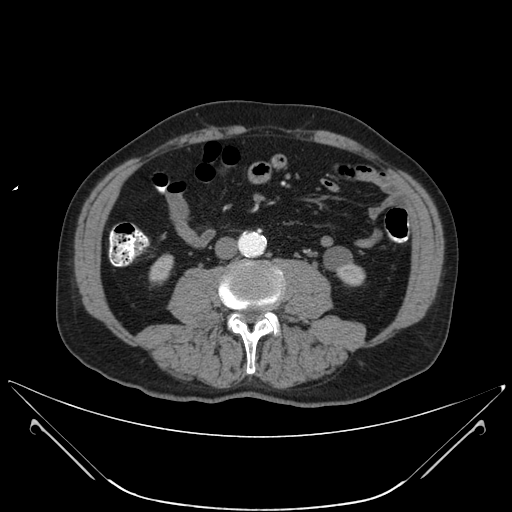

[cor · coronal · 0.85mm/px · 3 of 149 slices shown, 4 images]
[im 50/149  soft-tissue]
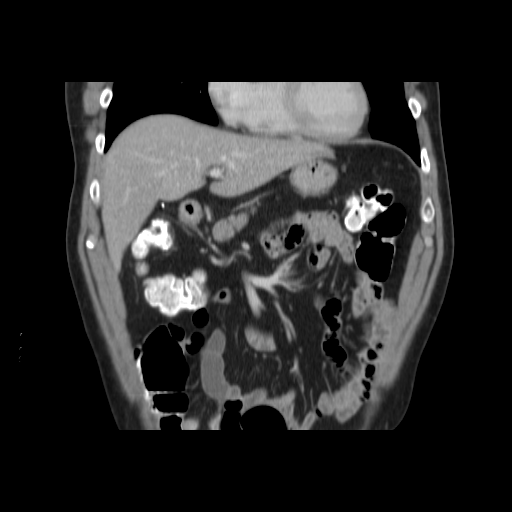
[im 66/149  soft-tissue]
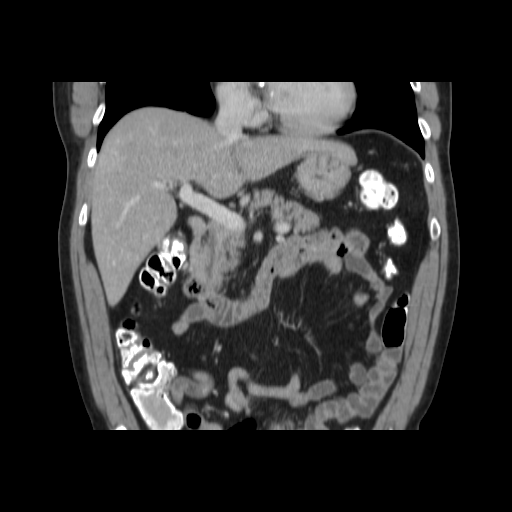
[im 66/149  bone]
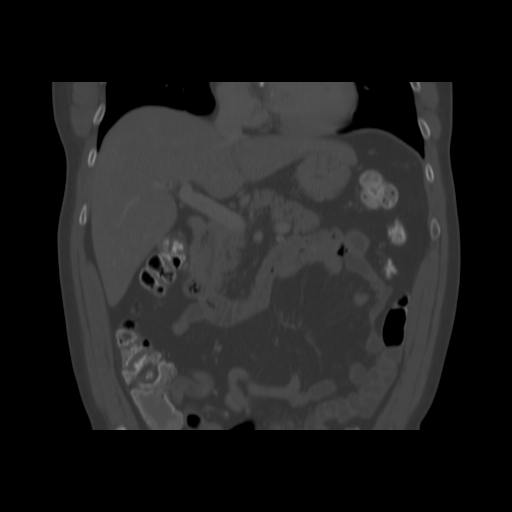
[im 83/149  soft-tissue]
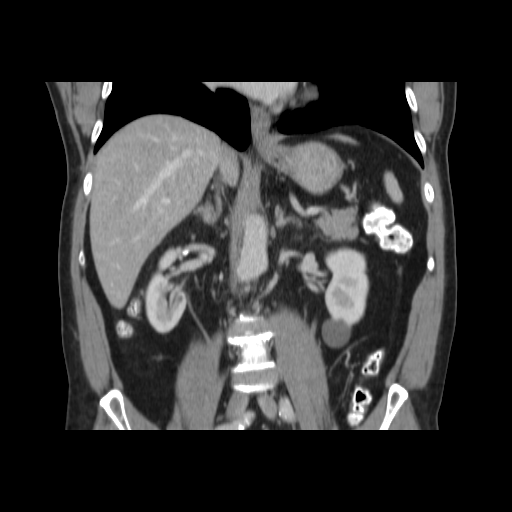

[sag · sagittal · 0.87mm/px · 1 of 182 slices shown, 2 images]
[im 61/182  soft-tissue]
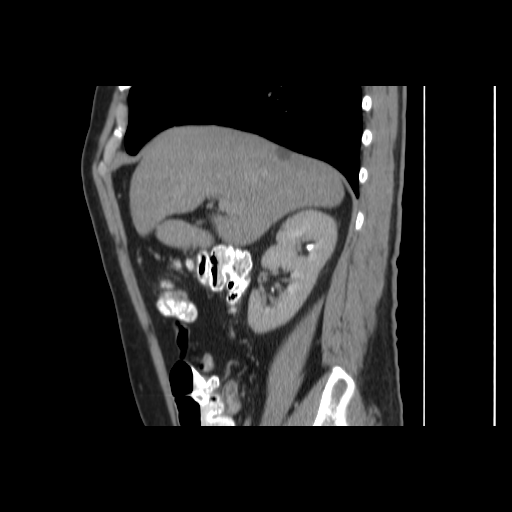
[im 61/182  bone]
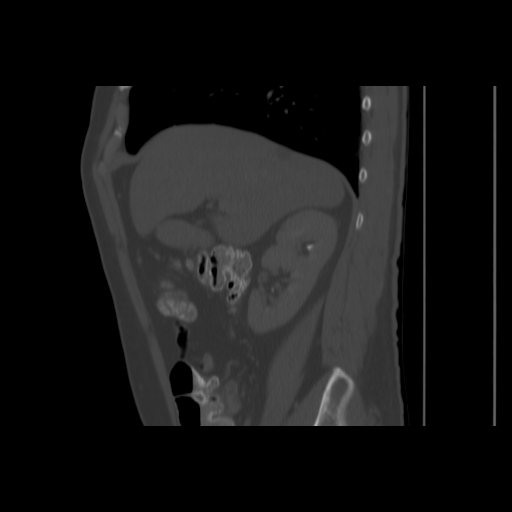

[12 of 46 positions shown; findings below may reference images not displayed]

FINDINGS: Limited visualization of lung bases is unremarkable. There is no pleural or pericardial effusion.

There is a 14 mm hemangioma within the superior left hepatic lobe medial segment and a 7.5 mm hemangioma within the inferior right hepatic lobe. There is also a 2.5 cm cyst within the right hepatic lobe posterior segment superiorly. There is no suspicious hepatic mass. There are multiple left renal cysts, measuring up to 2.5 cm within the lower pole. Gallbladder is surgically absent. There is nodular thickening of the left adrenal gland. Right adrenal gland and right kidney are normal. Visualized bowel loops are unremarkable. There is extensive atherosclerotic disease. There is no adenopathy. There is severe degenerative disc disease at L2-3 level and moderate disc space narrowing at L5-S1 level.
IMPRESSION: 1. Hepatic hemangiomas and cyst as detailed above. No suspicious liver mass. Follow-up contrast enhanced CT scan is recommended in 3 months to document stability. 

2. Multiple left renal cysts. 

3. Extensive atherosclerotic disease.

## 2017-06-12 HISTORY — PX: HX COLONOSCOPY: 2100001147

## 2018-01-11 IMAGING — CR XRAY LUMBAR SPINE COMPLETE
1 series · 7 of 7 positions shown · non-contrast
Comparison: CT abdomen and pelvis dated 05/10/2017.

EXAM:  XRAY LUMBAR SPINE COMPLETE, FOUR VIEWS
INDICATION: Low back pain.

[Series 2: view not recorded · 0.17mm/px · 7 of 7 slices shown]
[im 1/7]
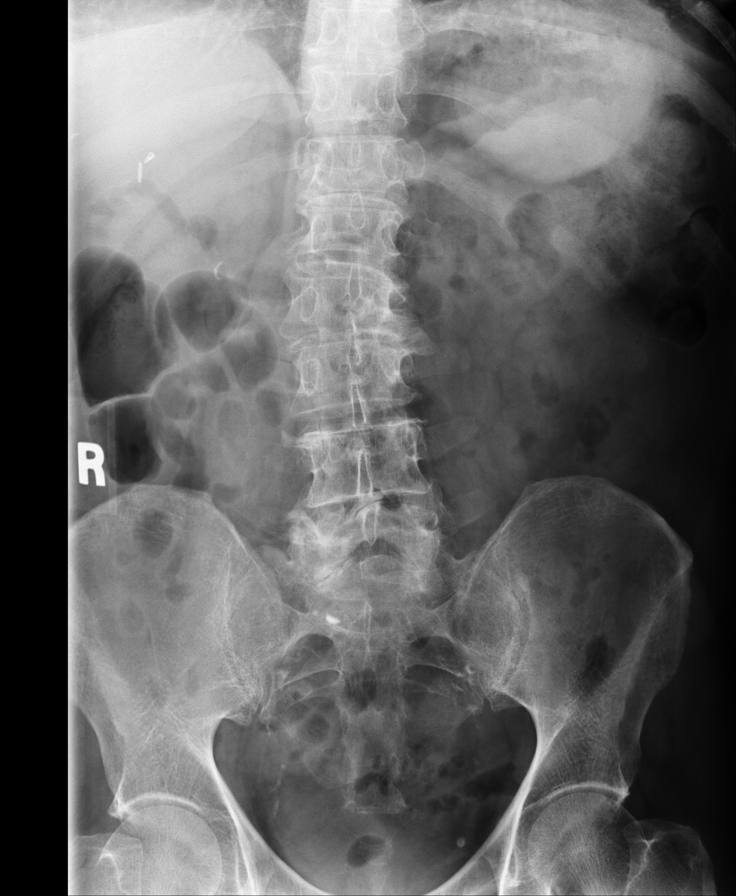
[im 2/7]
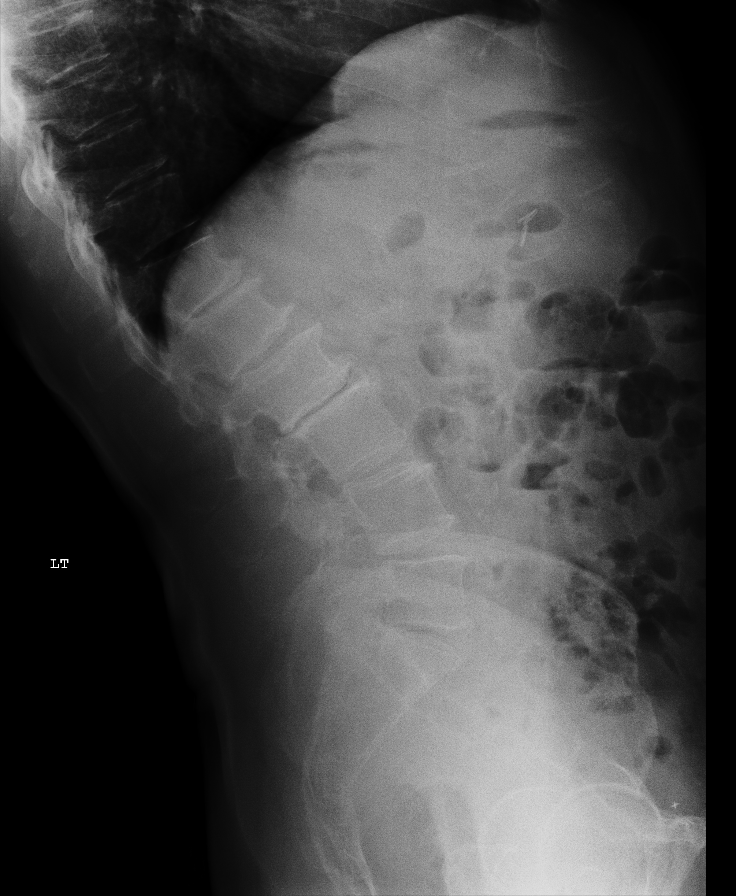
[im 3/7]
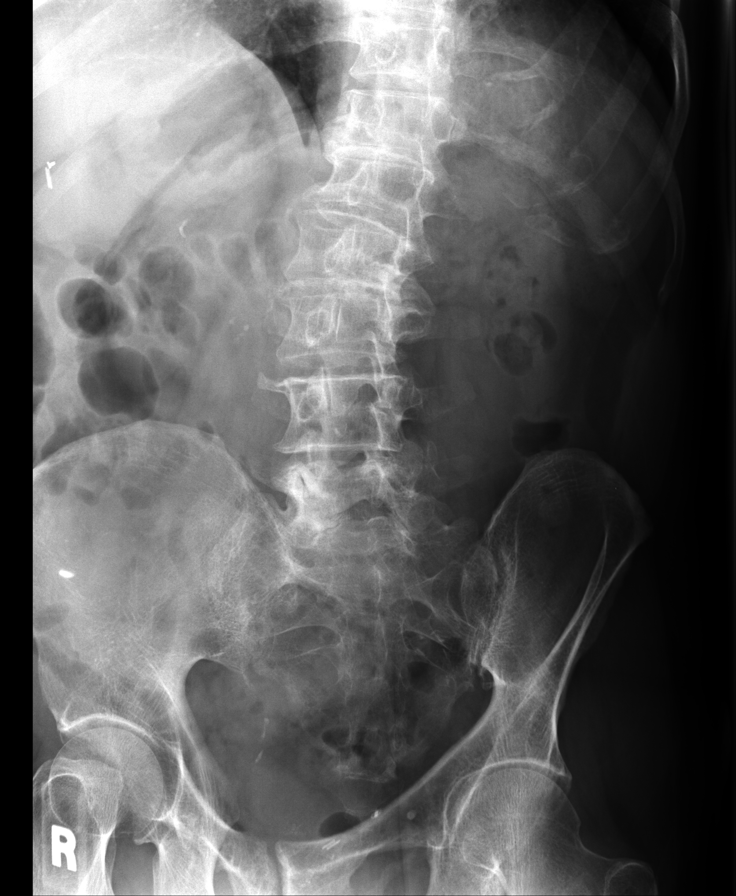
[im 4/7]
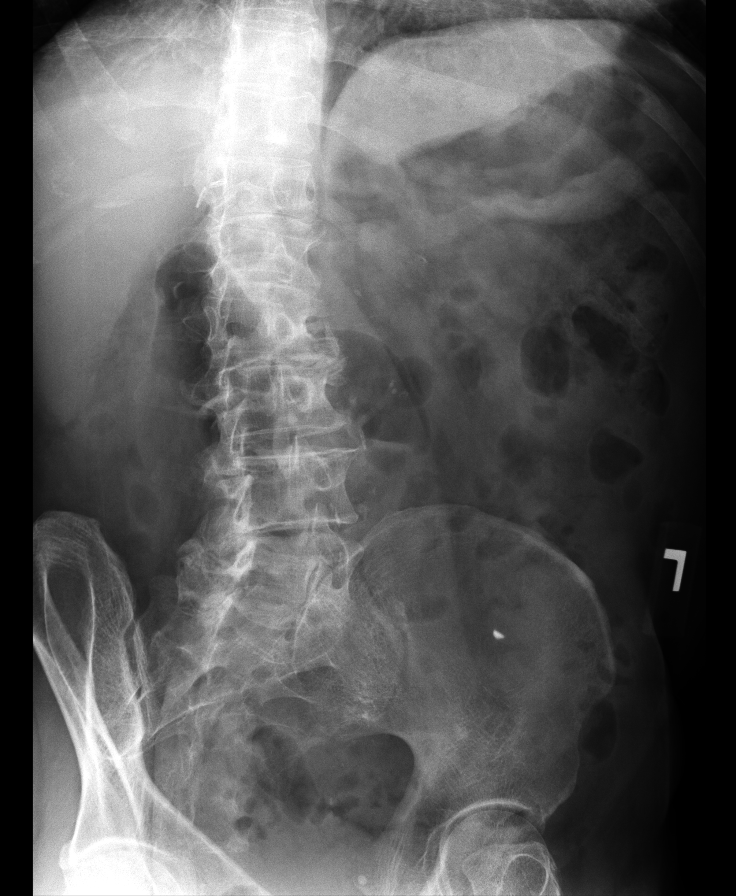
[im 5/7]
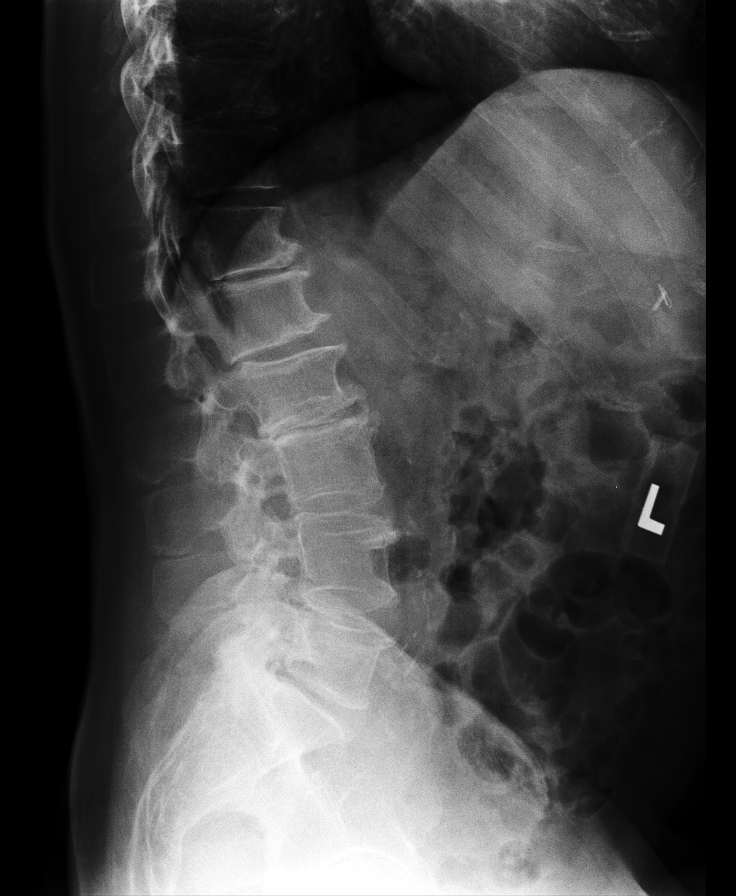
[im 6/7]
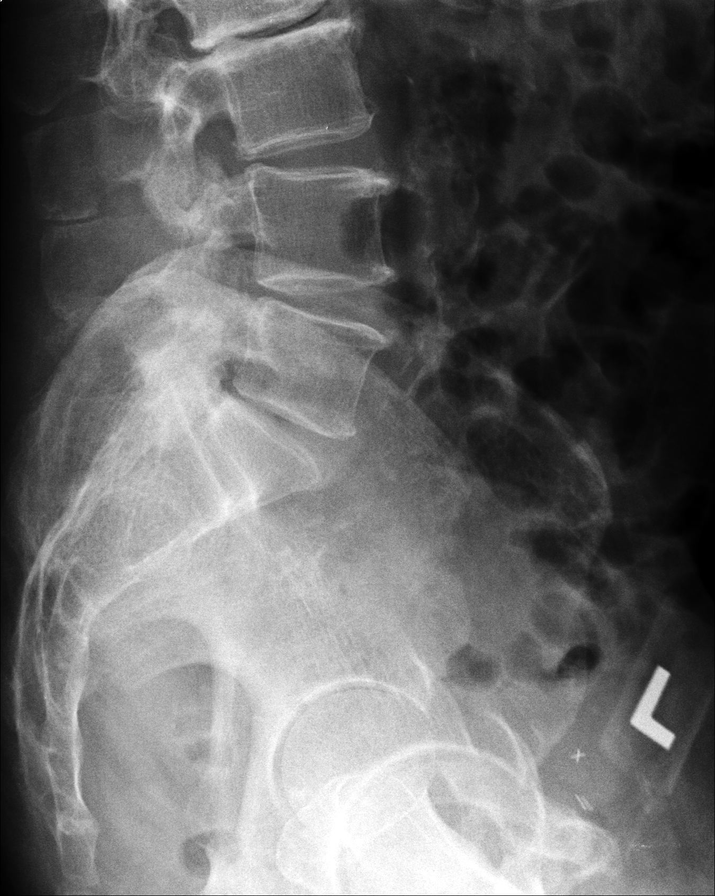
[im 7/7]
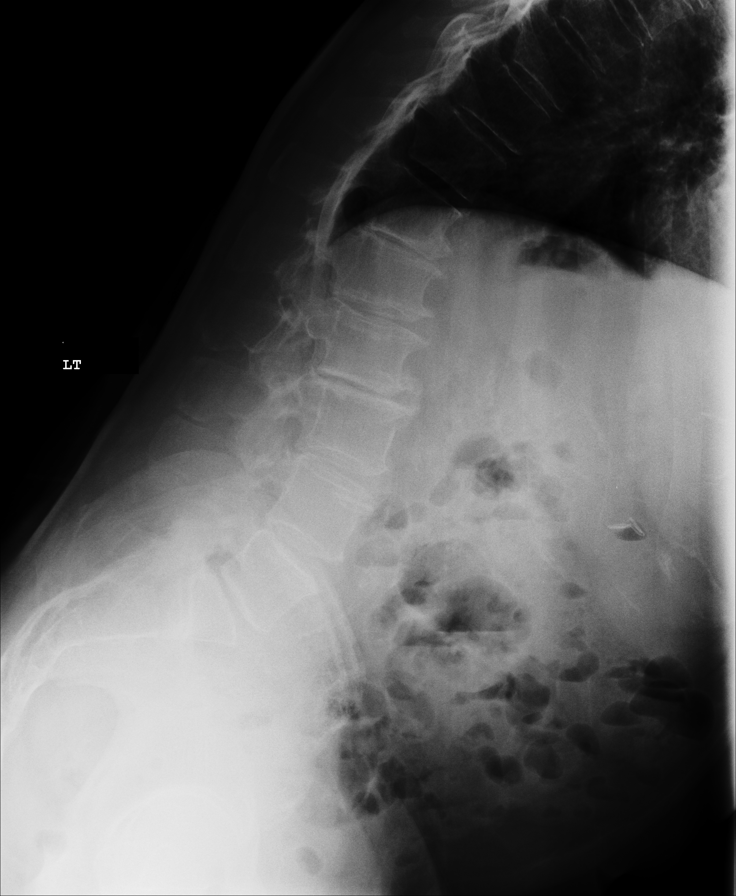

[7 of 7 positions shown; findings below may reference images not displayed]

FINDINGS: There is no acute fracture or subluxation.  There is severe degenerative disc disease at T12-L1, L2-L3 and L5-S1 levels.  There is also extensive facet arthropathy throughout the lumbar spine.  There is no definite abnormality on the flexion and extension views.  Paraspinal soft tissues are unremarkable.  Cholecystectomy clips are also seen.
IMPRESSION: Multilevel degenerative changes as detailed above.

## 2018-03-29 IMAGING — US US RETROPERITONEAL COMPLETE
1 series · 14 of 25 positions shown · non-contrast
Comparison: CT abdomen and pelvis dated 05/10/2017.

EXAM:  US RETROPERITONEAL COMPLETE
INDICATION: Renal mass.

[Series 2: us retroperitoneal complete · 0.29mm/px · 14 of 78 slices shown]
[im 1/78]
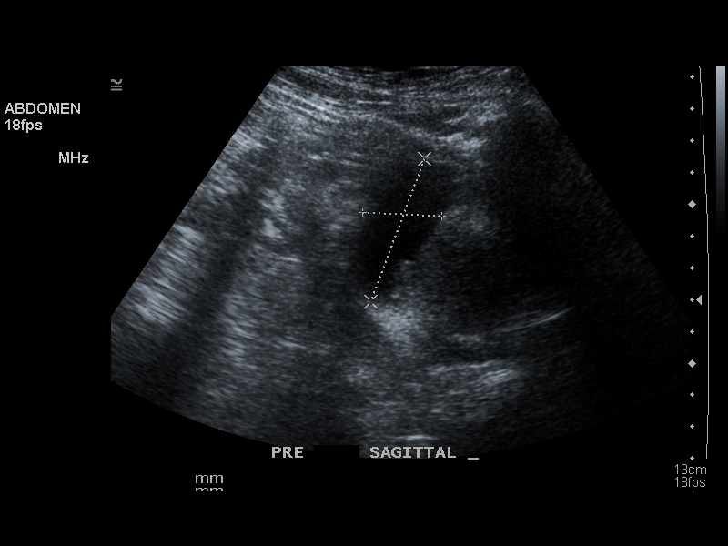
[im 7/78]
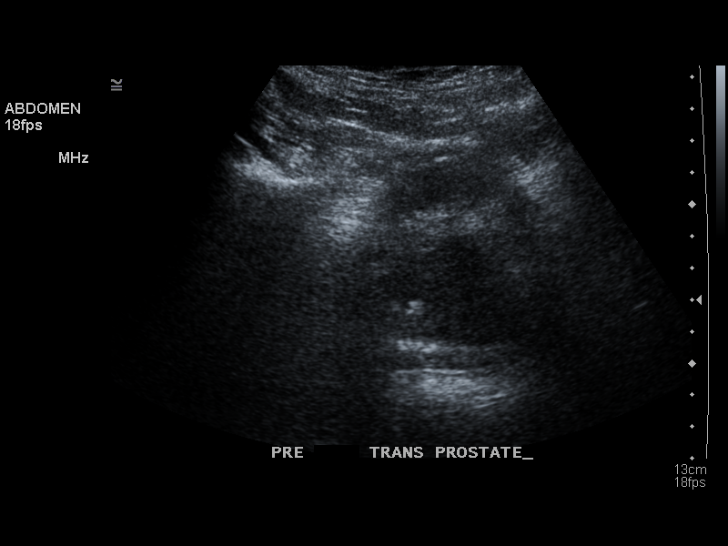
[im 13/78]
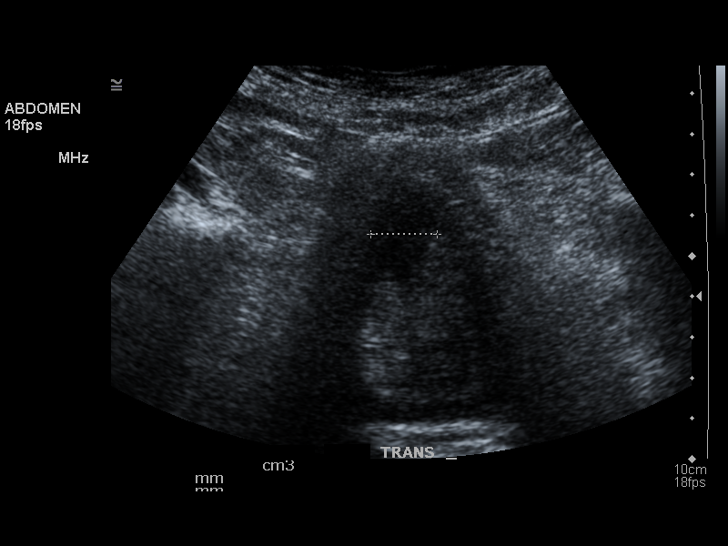
[im 20/78]
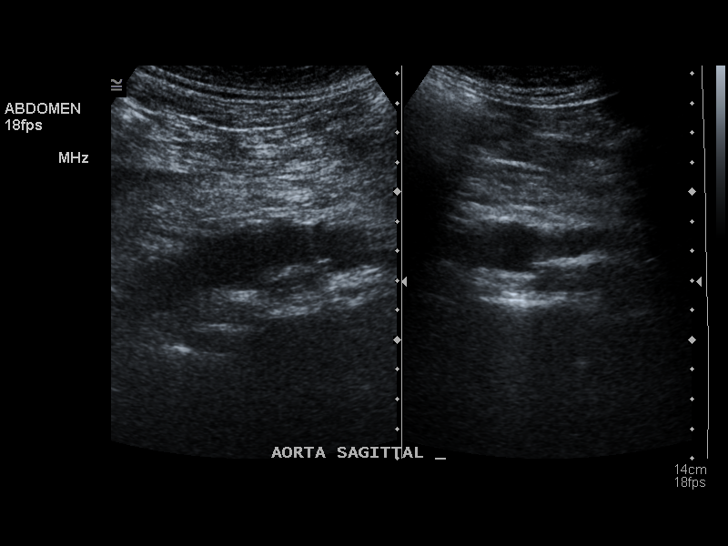
[im 26/78]
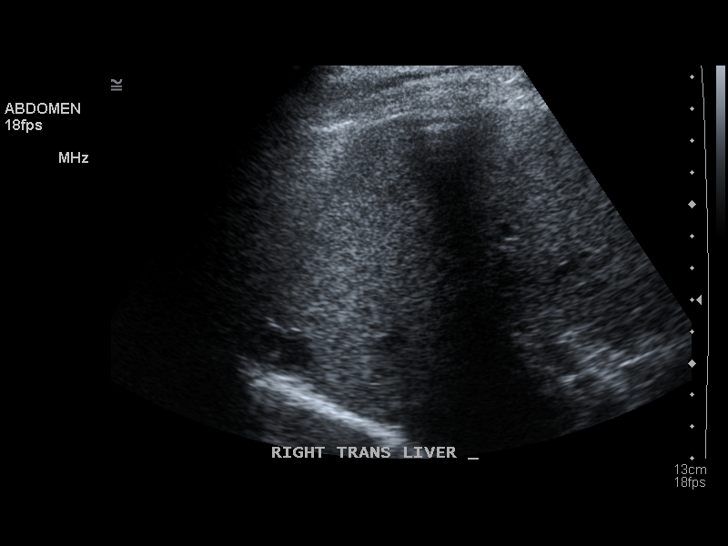
[im 29/78]
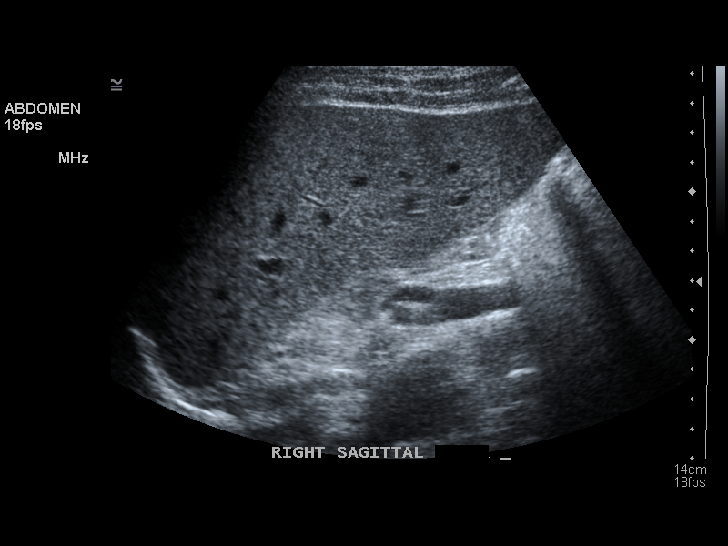
[im 36/78]
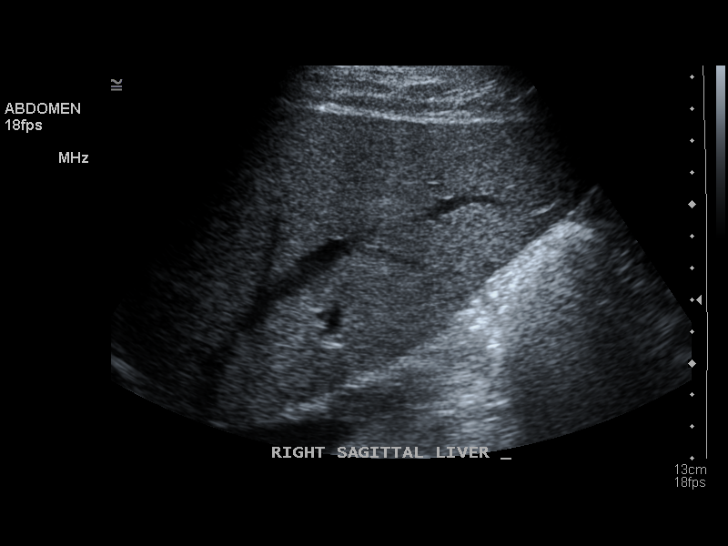
[im 42/78]
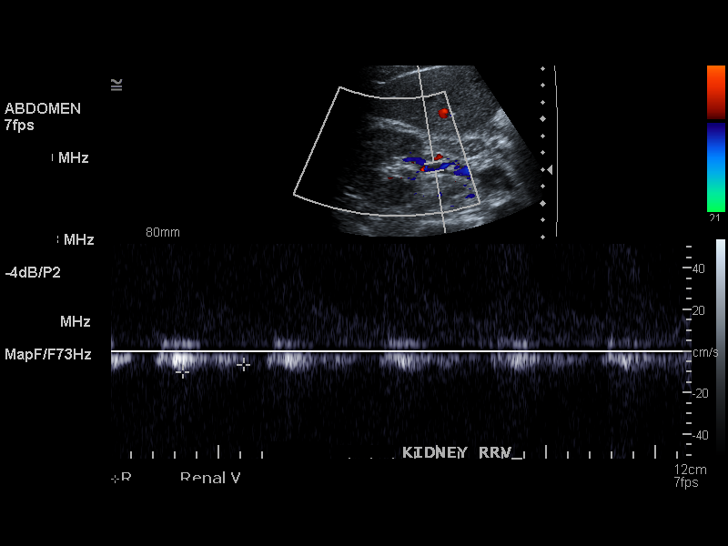
[im 49/78]
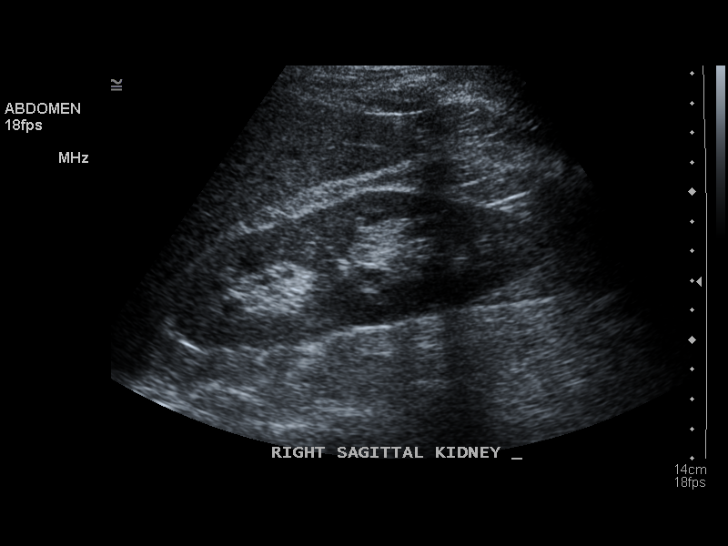
[im 52/78]
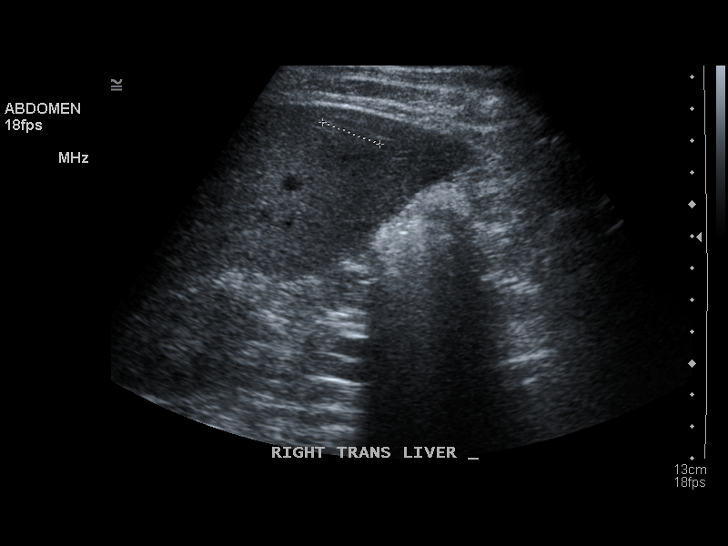
[im 58/78]
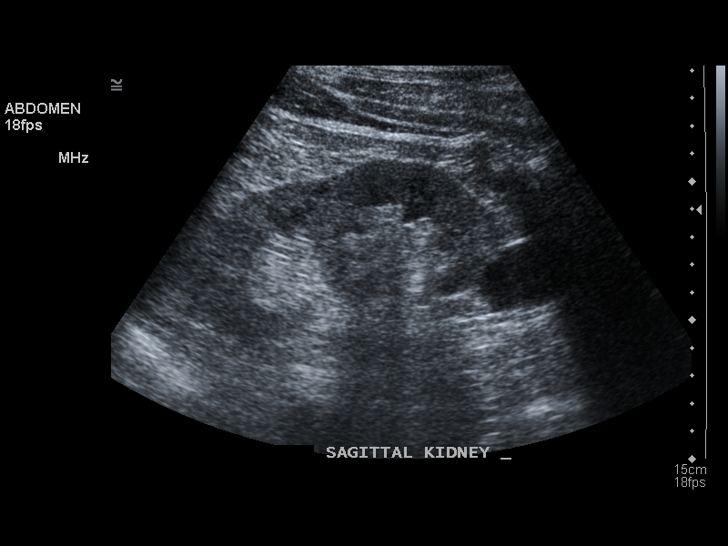
[im 65/78]
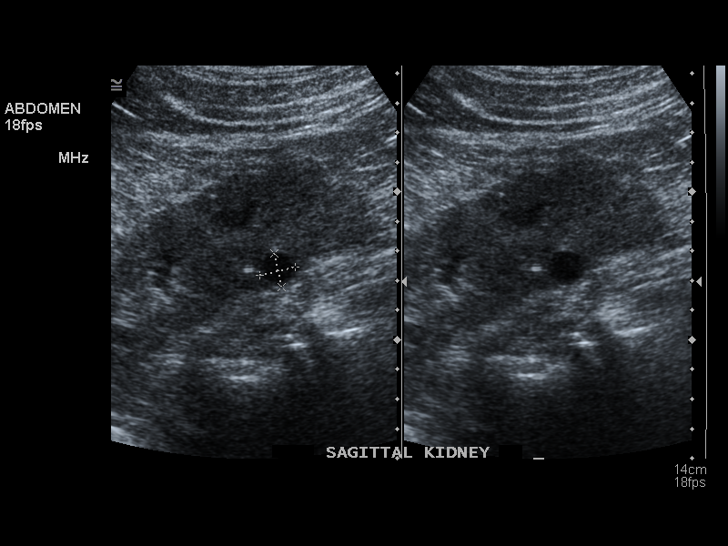
[im 71/78]
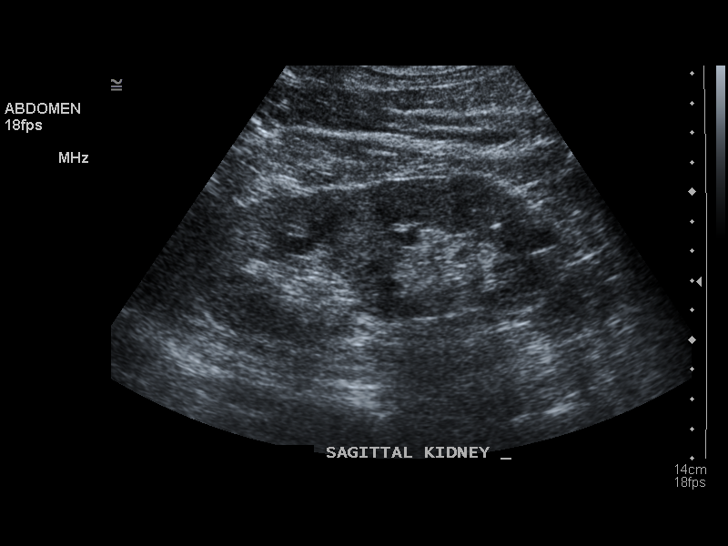
[im 78/78]
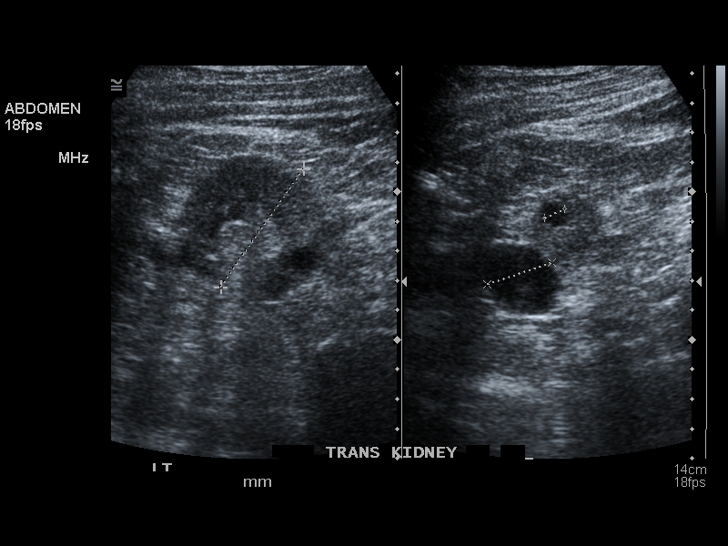

[14 of 25 positions shown; findings below may reference images not displayed]

FINDINGS: Kidneys are normal in echogenicity and measure 12.5 cm bilaterally.  Multiple left renal cysts, measuring up to 3 cm within the lower pole, are again noted. There is no hydronephrosis, mass or shadowing calculus on either side.

Urinary bladder is partially distended and grossly unremarkable.  There is no significant postvoid residual.
IMPRESSION: Stable multiple left renal cysts.  No suspicious mass.

## 2018-03-29 IMAGING — US CAROTID US W/DOPPLER
1 series · 14 of 24 positions shown · non-contrast
Comparison: None.

EXAM:  CAROTID US W/DOPPLER
INDICATION: Near syncope.

[Series 2: carotid us w/doppler · 0.07mm/px · 14 of 75 slices shown]
[im 1/75]
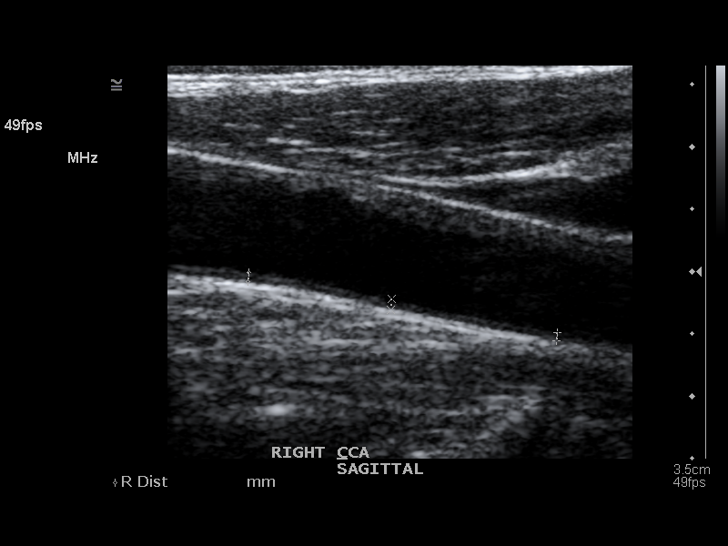
[im 7/75]
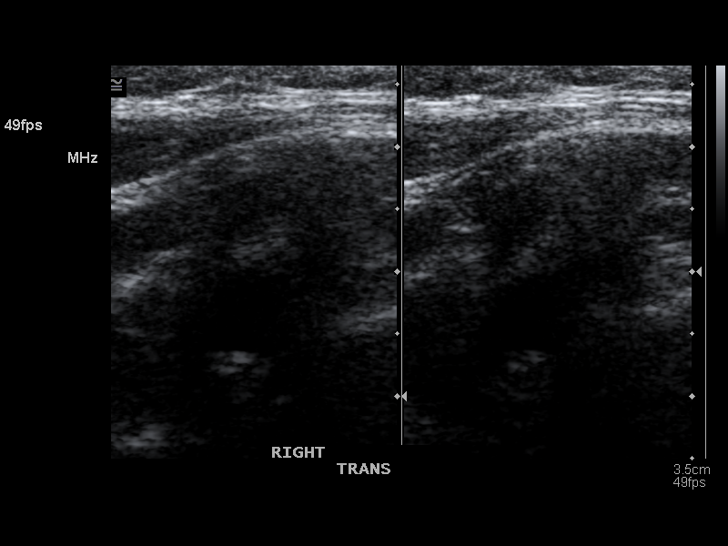
[im 13/75]
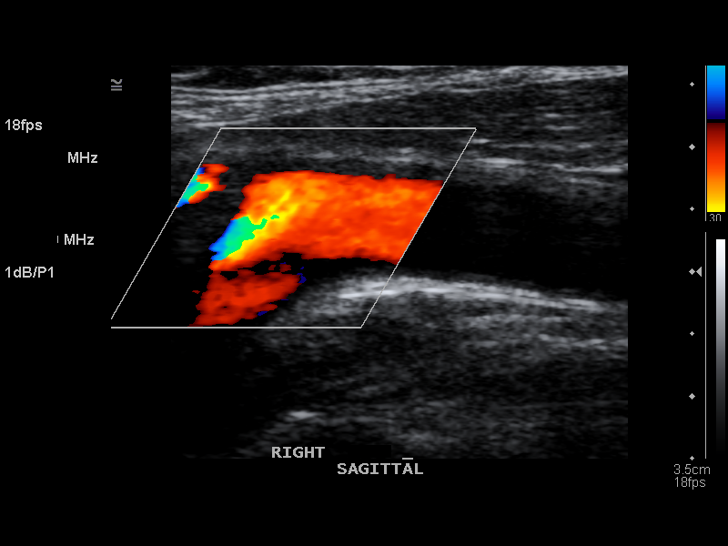
[im 20/75]
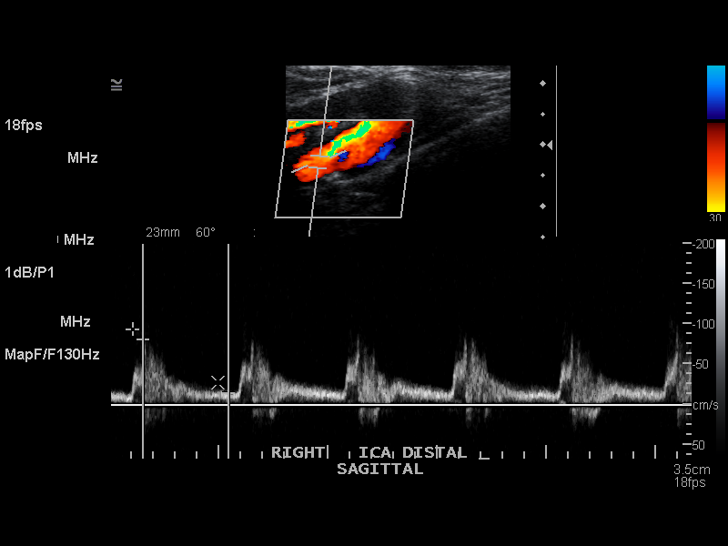
[im 23/75]
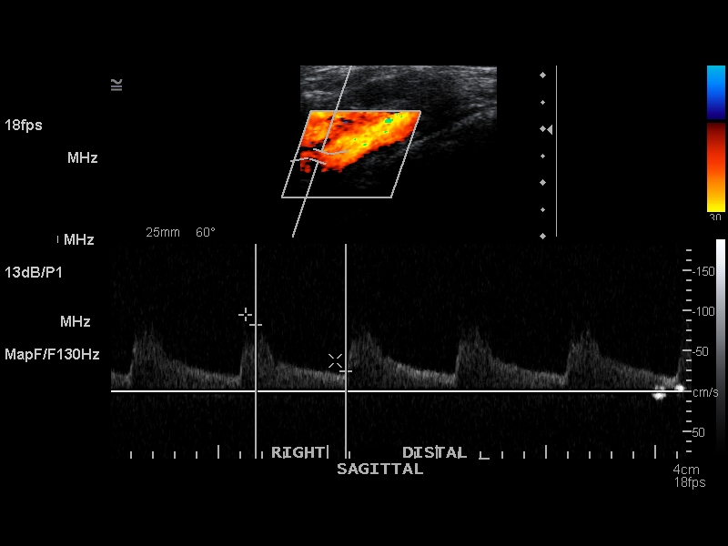
[im 29/75]
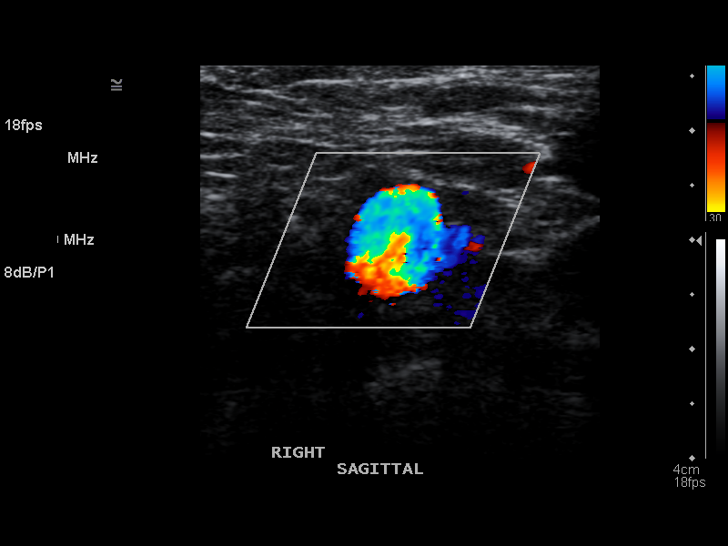
[im 36/75]
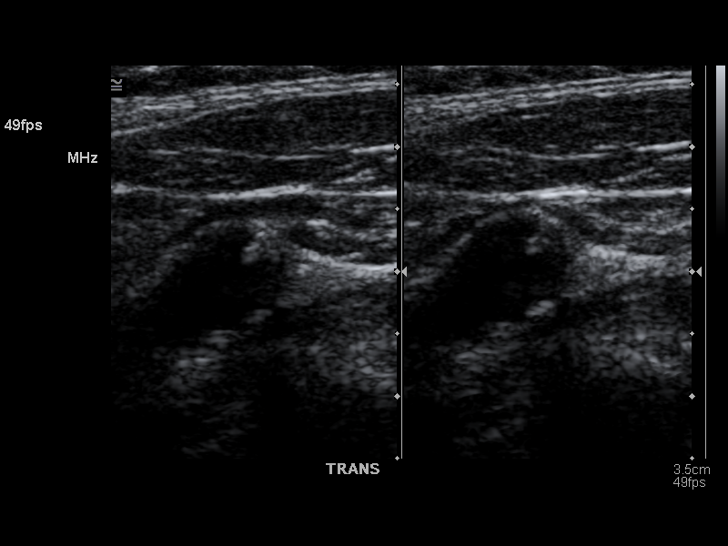
[im 39/75]
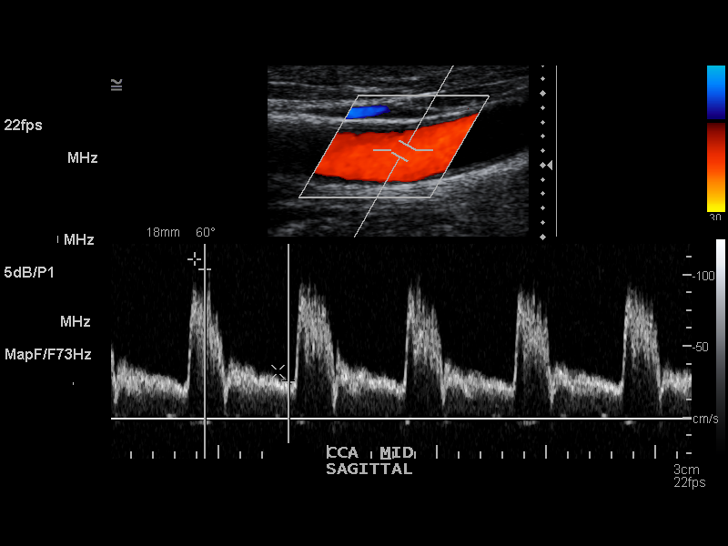
[im 46/75]
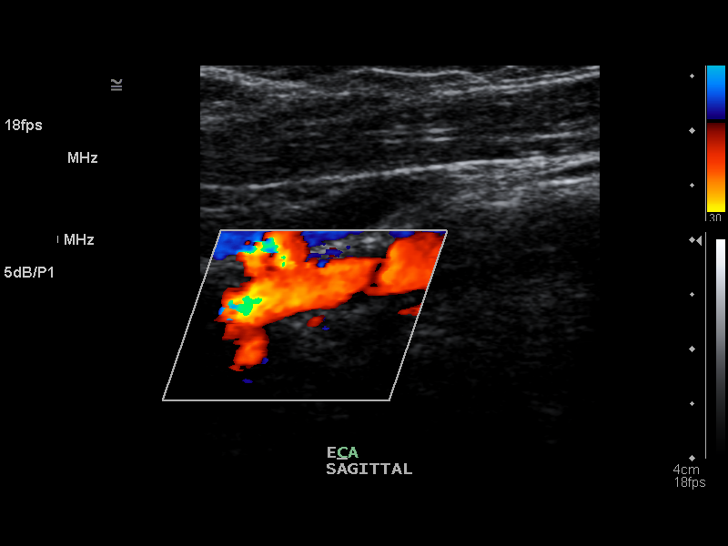
[im 52/75]
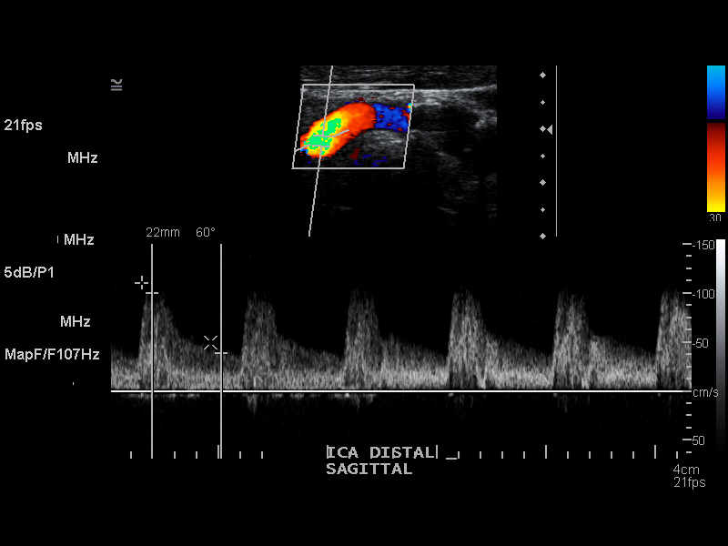
[im 58/75]
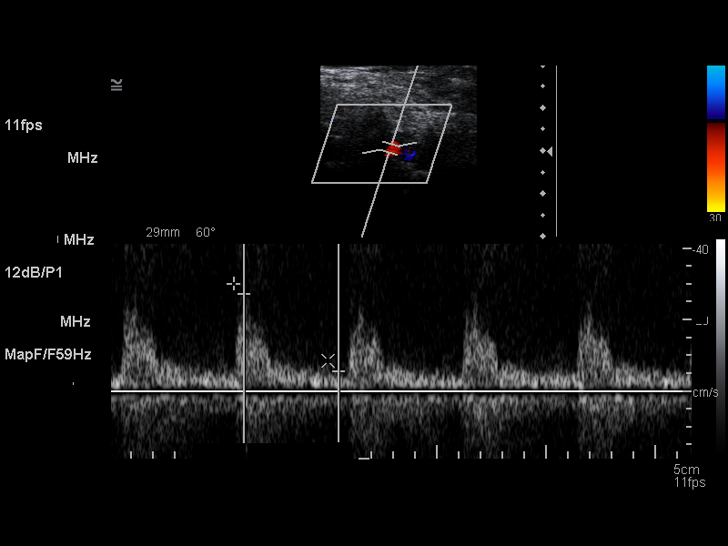
[im 62/75]
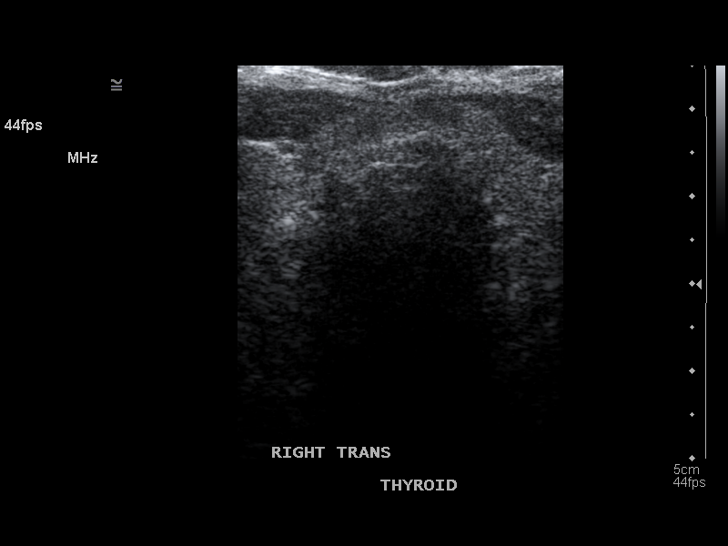
[im 68/75]
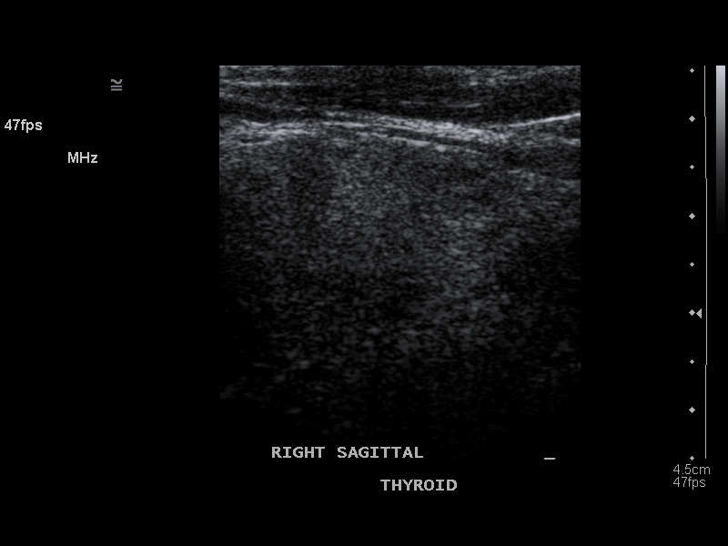
[im 75/75]
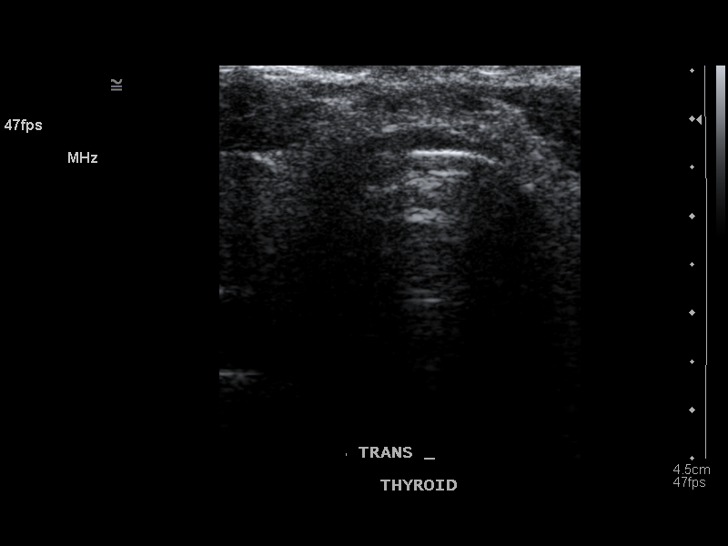

[14 of 24 positions shown; findings below may reference images not displayed]

FINDINGS: There is a small amount of heterogeneous atherosclerotic plaque within the carotid bulbs bilaterally.  Peak systolic velocity within the right ICA is 96 cm/s and within the left ICA is 108 cm/s.  ICA/CCA ratio on the right is 0.85 and on the left is 1.04.  Both vertebral arteries are also patent with 

appropriate antegrade direction of flow. A few subcentimeter hypoechoic thyroid nodules are incidentally noted.
IMPRESSION: No hemodynamically significant stenosis within the internal carotid arteries as per NASCET criteria.

Subcentimeter thyroid nodules.  A dedicated thyroid sonogram is recommended for better assessment.

## 2018-06-17 DIAGNOSIS — M5116 Intervertebral disc disorders with radiculopathy, lumbar region: Secondary | ICD-10-CM

## 2018-06-17 HISTORY — DX: Intervertebral disc disorders with radiculopathy, lumbar region: M51.16

## 2018-08-22 IMAGING — MR MRI LOWER EXTREMITY WITHOUT CONTRAST LT
4 of 7 series · 19 of 40 positions shown · IV contrast (gadolinium)
Comparison: None available.

EXAM:  MRI JOINT OF LOWER EXTREMITY WITHOUT CONTRAST LT,MRI LOWER EXTREMITY WITHOUT CONTRAST LT
INDICATION: Pain since injury 5 weeks ago.
TECHNIQUE: Multiplanar multisequential MRI of the left ankle and foot was performed without gadolinium contrast.

[Series 7: T1 · sagittal · left · 3.0mm · 0.49mm/px · 5 of 22 slices shown (1 of 3)]
[im 1/22]
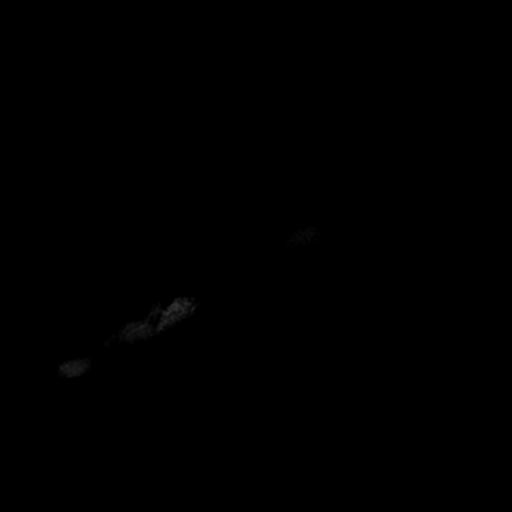
[im 6/22]
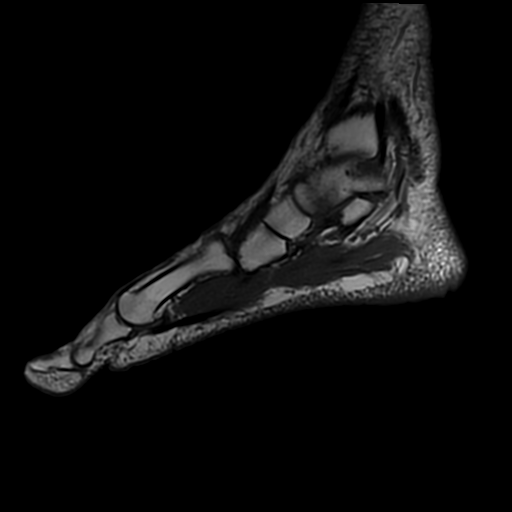
[im 11/22]
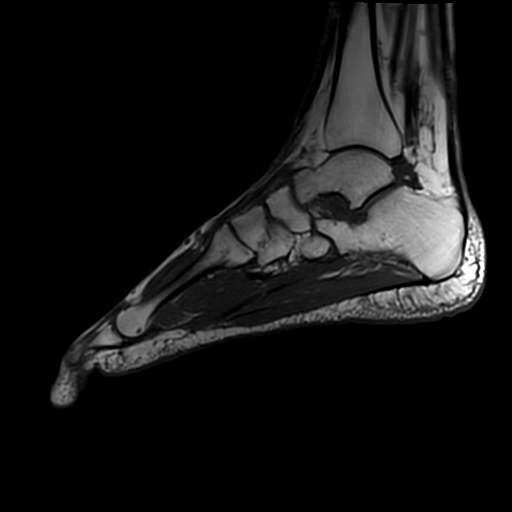
[im 16/22]
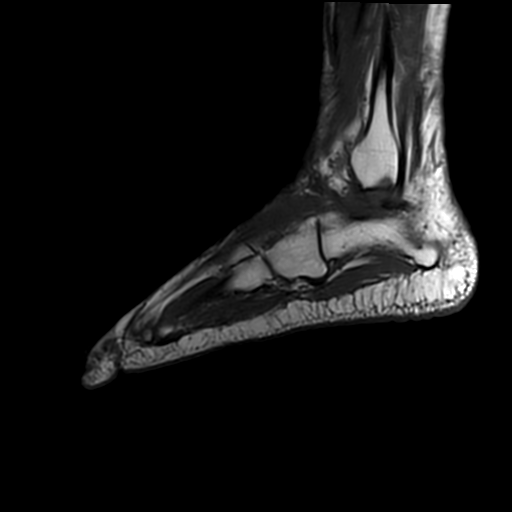
[im 22/22]
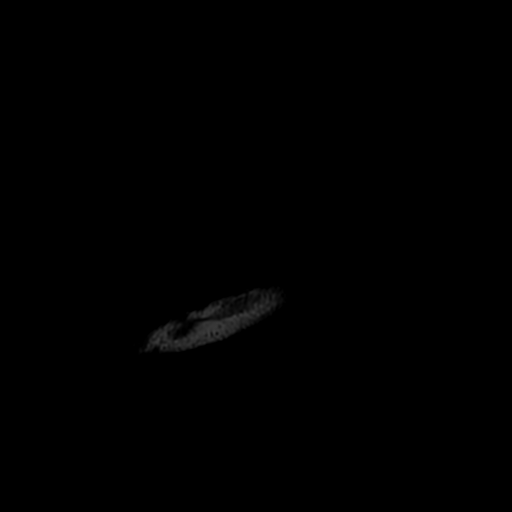

[Series 9: T1 · coronal · left · 5.0mm · 0.29mm/px · 6 of 32 slices shown (2 of 3)]
[im 1/32]
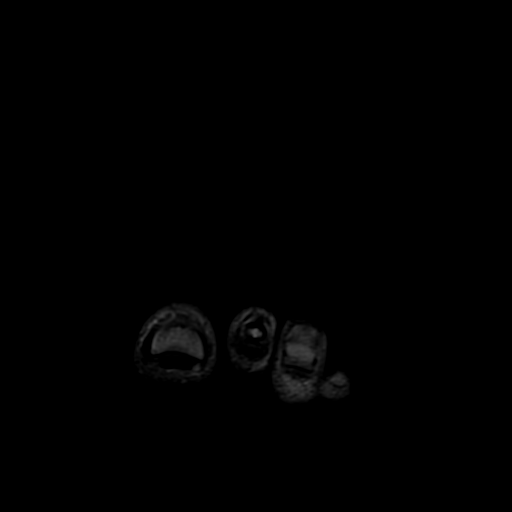
[im 6/32]
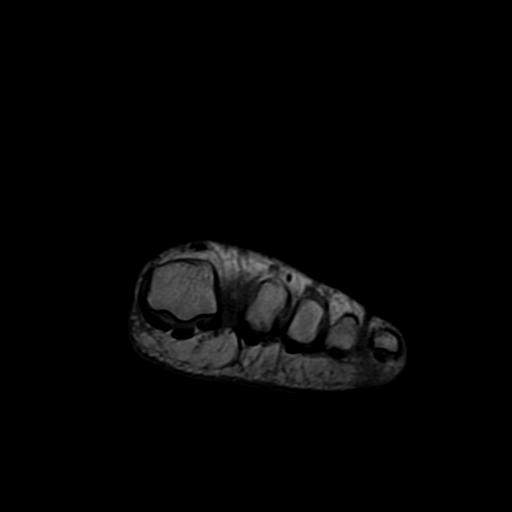
[im 11/32]
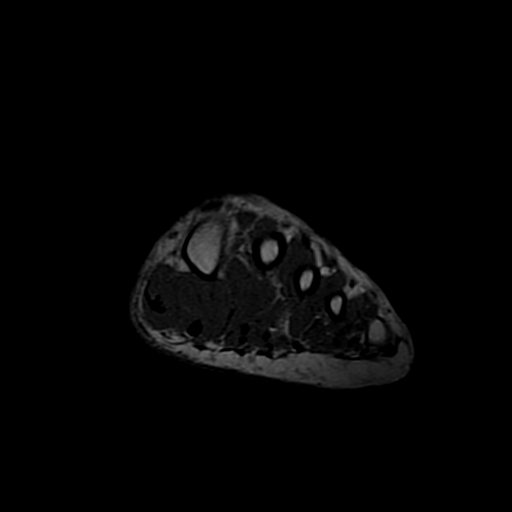
[im 16/32]
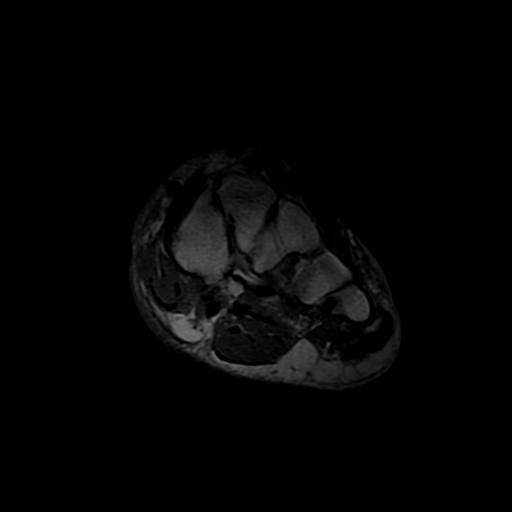
[im 21/32]
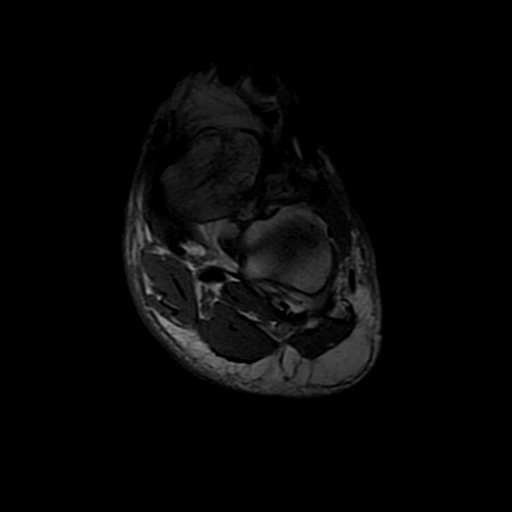
[im 26/32]
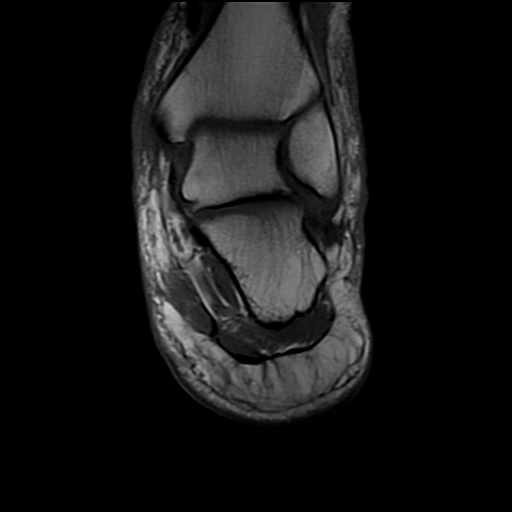

[Series 11: T1 · axial · left · 3.0mm · 0.49mm/px · z∈[-69,+12]mm · 3 of 22 slices shown (3 of 3)]
[im 1/22]
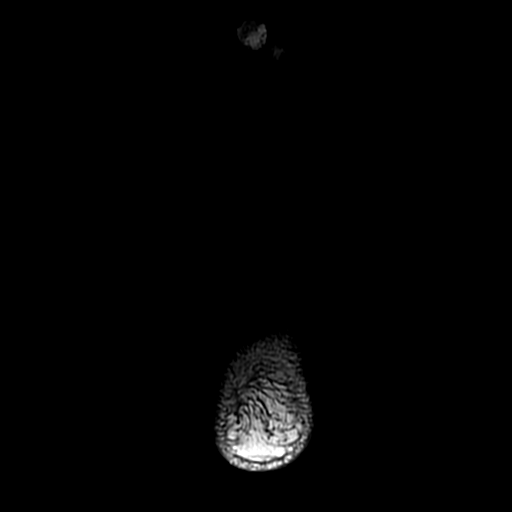
[im 11/22]
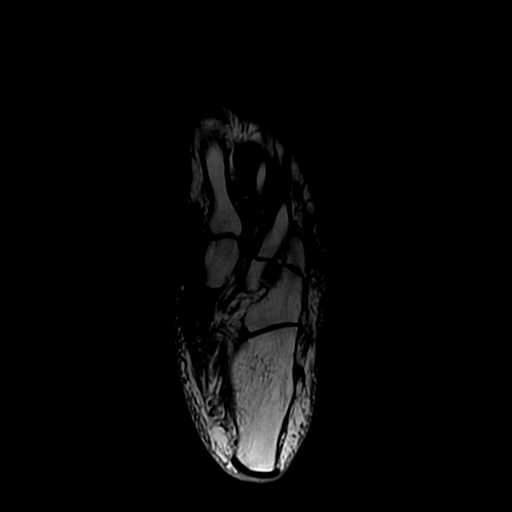
[im 22/22]
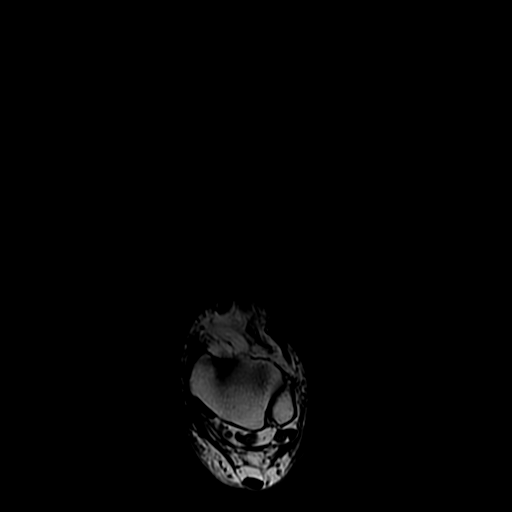

[Series 12: T2 fat-sat · axial · left · 3.0mm · 0.49mm/px · z∈[-69,+12]mm · 5 of 22 slices shown]
[im 1/22]
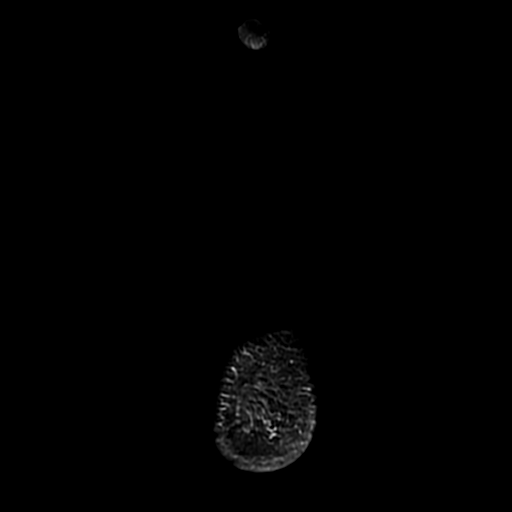
[im 6/22]
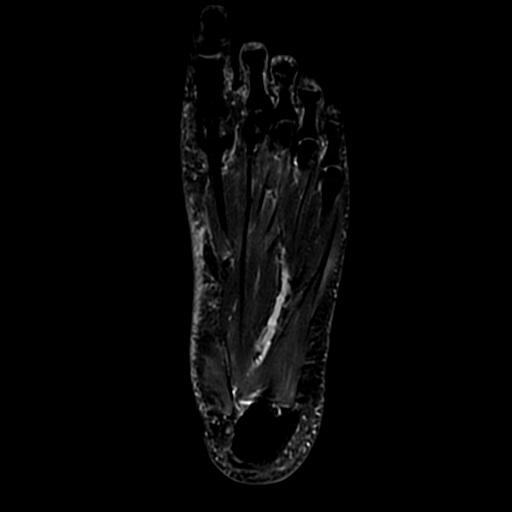
[im 11/22]
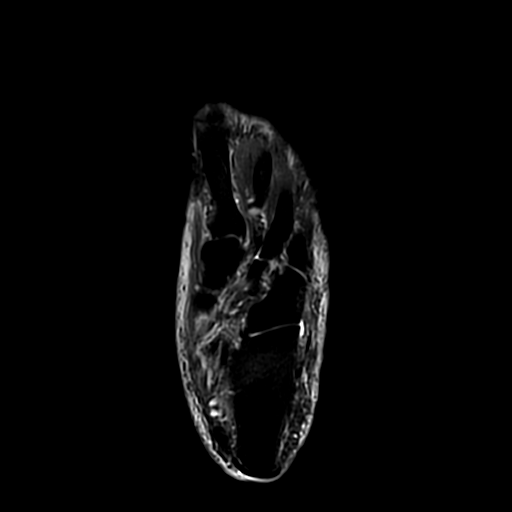
[im 16/22]
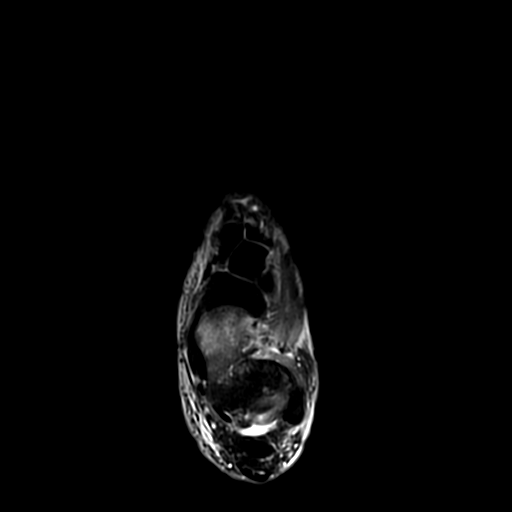
[im 22/22]
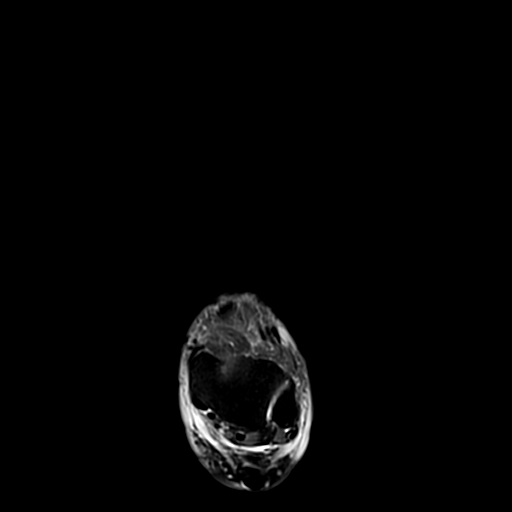

[19 of 40 positions shown; findings below may reference images not displayed]

FINDINGS: Examination is somewhat degraded due to excessive patient motion. There is moderate edema within the anterior talus. Abnormal low T1 signal intensity is seen within the sinus tarsi. No significant arthritic changes are seen within the ankle and foot. Lisfranc ligament is intact. Visualized flexor, extensor and peroneal tendons are also normal without evidence of tenosynovitis, tendon degeneration or tear. Inferior syndesmotic, talofibular, calcaneofibular and deltoid ligaments are also normal. Intrinsic muscles of the forefoot are also unremarkable without evidence of a definite mass or denervation atrophy. Plantar aponeurosis is normal without fasciitis, fibromatosis or tear. There is moderate subcutaneous edema.
IMPRESSION: Moderate edema within the anterior talus. 

Abnormal low T1 signal intensity within the sinus tarsi suggestive of sinus tarsi syndrome. 

Moderate subcutaneous edema.

## 2018-08-22 IMAGING — MR MRI JOINT OF LOWER EXTREMITY WITHOUT CONTRAST LT
5 of 8 series · 22 of 40 positions shown · IV contrast (gadolinium)
Comparison: None available.

EXAM:  MRI JOINT OF LOWER EXTREMITY WITHOUT CONTRAST LT,MRI LOWER EXTREMITY WITHOUT CONTRAST LT
INDICATION: Pain since injury 5 weeks ago.
TECHNIQUE: Multiplanar multisequential MRI of the left ankle and foot was performed without gadolinium contrast.

[Series 8: T1 · sagittal · left · 3.0mm · 0.33mm/px · 5 of 20 slices shown (1 of 4)]
[im 1/20]
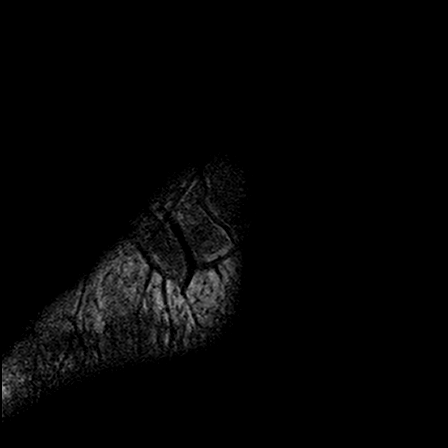
[im 5/20]
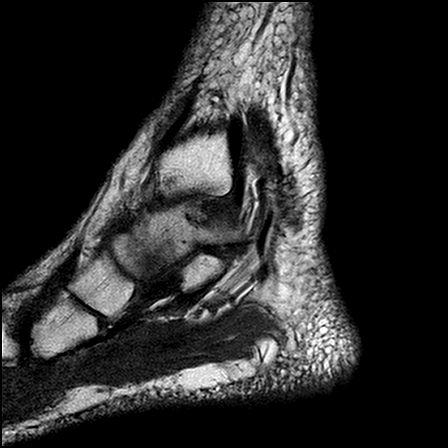
[im 10/20]
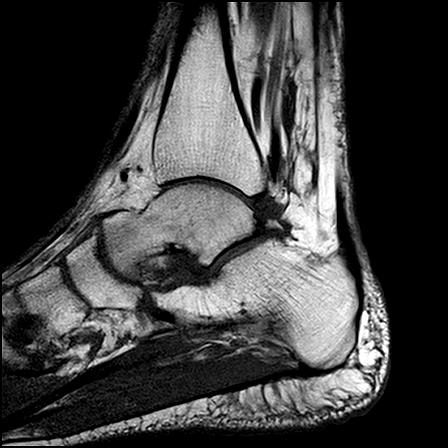
[im 15/20]
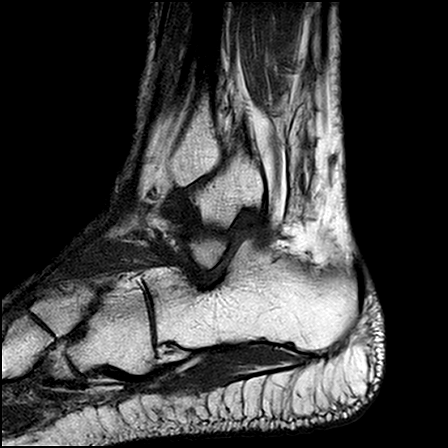
[im 20/20]
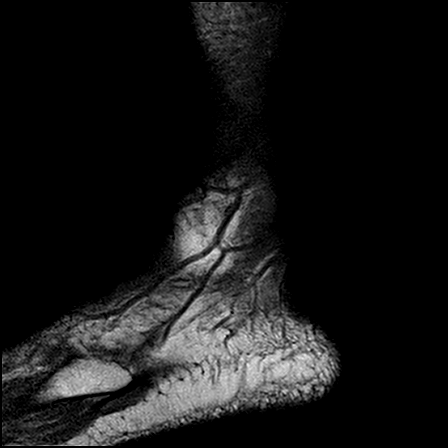

[Series 10: T1 · axial · left · 4.0mm · 0.29mm/px · z∈[-58,+63]mm · 5 of 28 slices shown (2 of 4)]
[im 1/28]
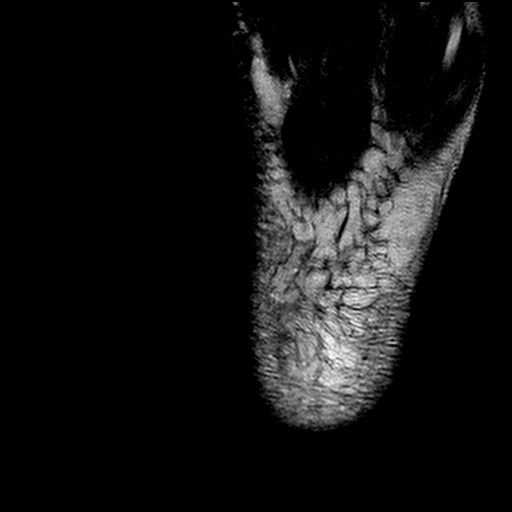
[im 7/28]
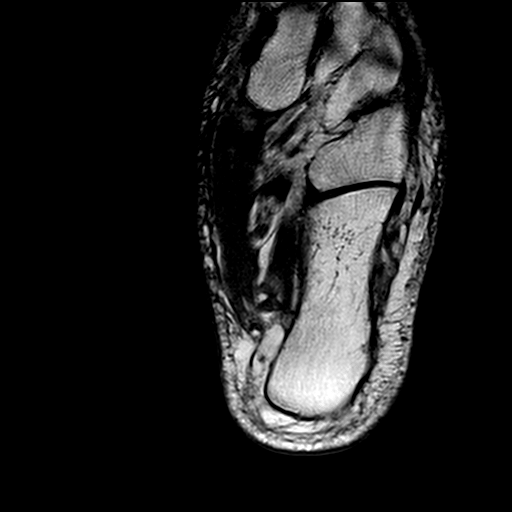
[im 14/28]
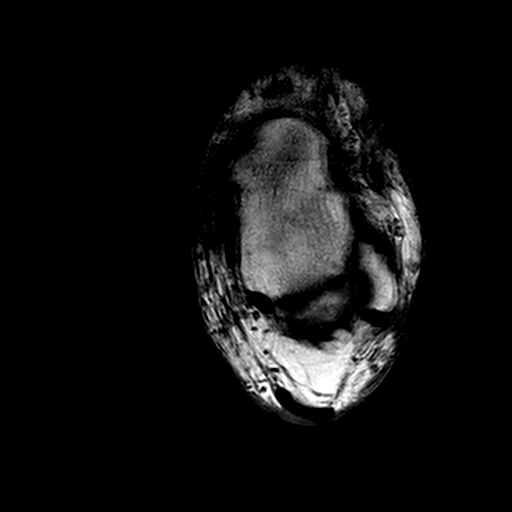
[im 21/28]
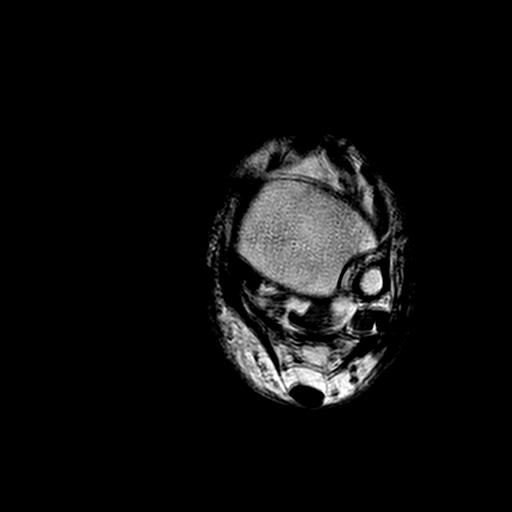
[im 28/28]
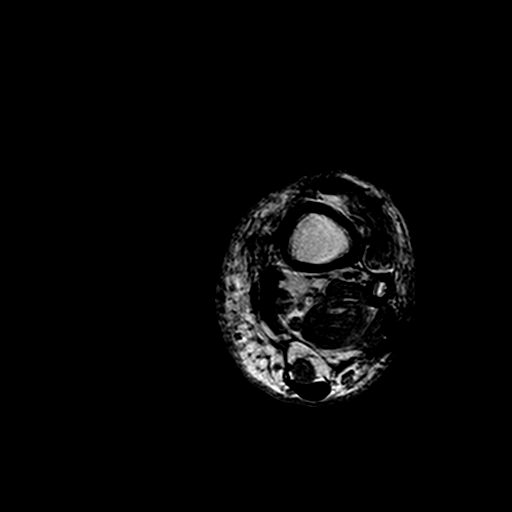

[Series 11: T2 fat-sat · axial · left · 4.0mm · 0.33mm/px · z∈[-58,+63]mm · 5 of 28 slices shown]
[im 1/28]
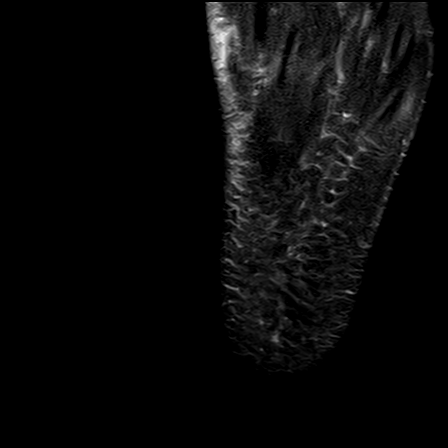
[im 7/28]
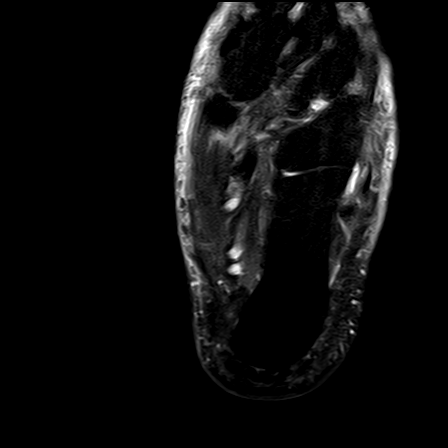
[im 14/28]
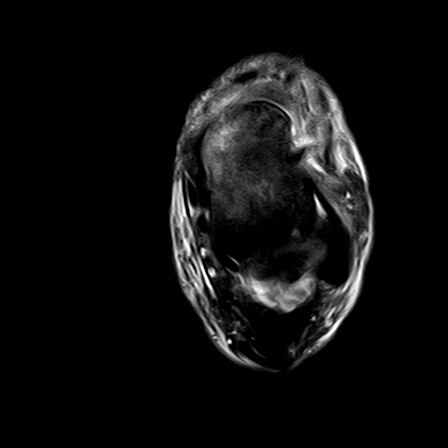
[im 21/28]
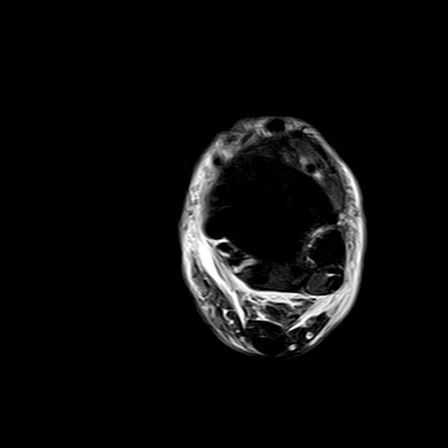
[im 28/28]
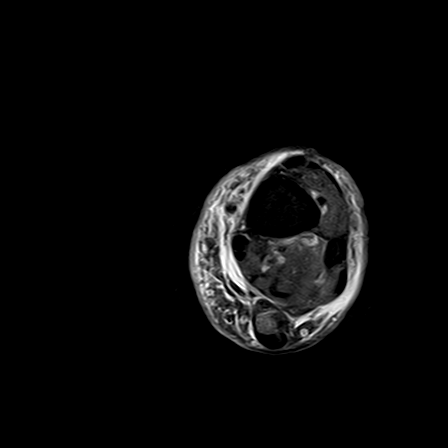

[Series 13: T1 · coronal · left · 4.0mm · 0.29mm/px · 5 of 26 slices shown (3 of 4)]
[im 1/26]
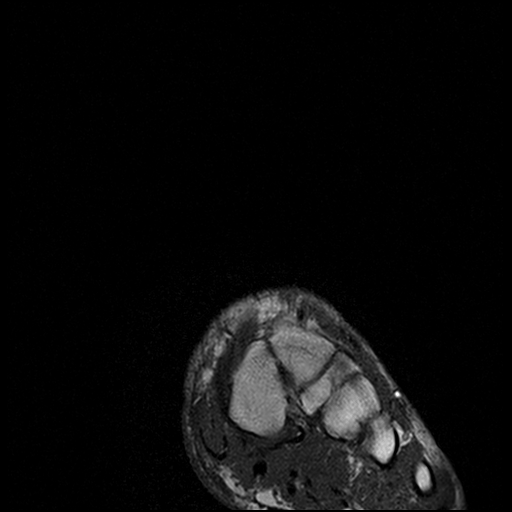
[im 7/26]
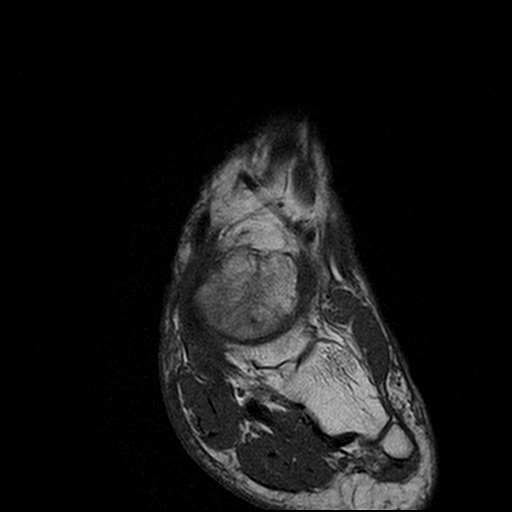
[im 13/26]
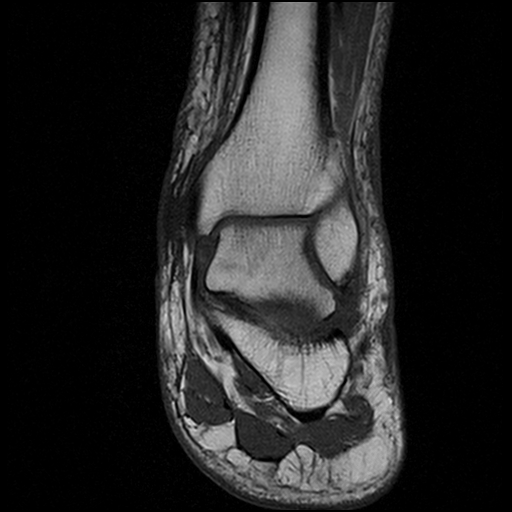
[im 19/26]
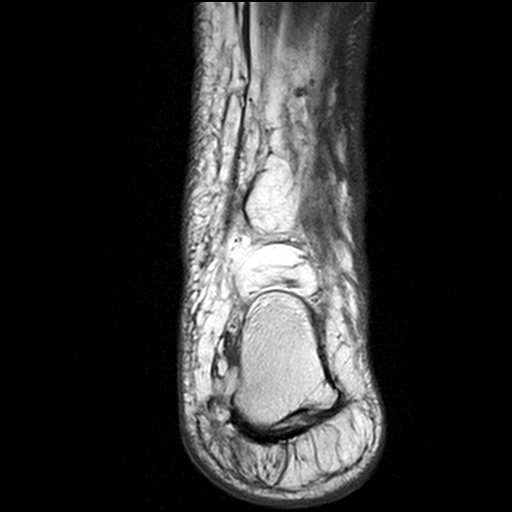
[im 26/26]
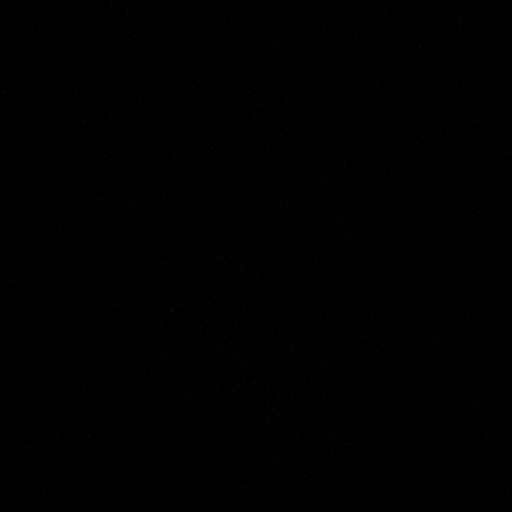

[Series 16: T1 · axial · left · 4.0mm · 0.29mm/px · z∈[-58,-31]mm · 2 of 28 slices shown (4 of 4)]
[im 1/28]
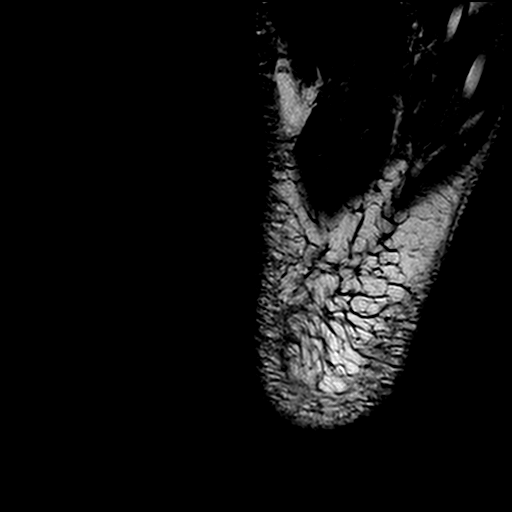
[im 7/28]
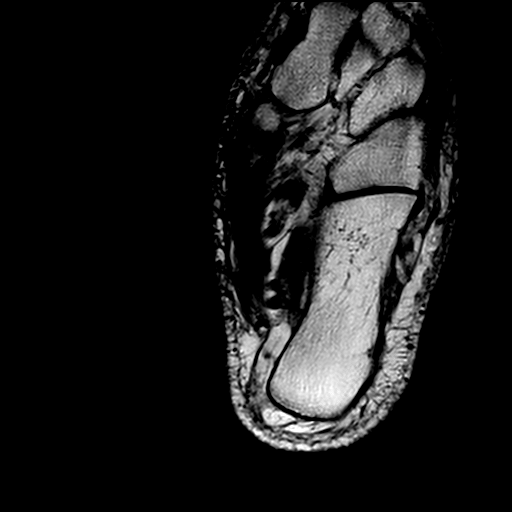

[22 of 40 positions shown; findings below may reference images not displayed]

FINDINGS: Examination is somewhat degraded due to excessive patient motion. There is moderate edema within the anterior talus. Abnormal low T1 signal intensity is seen within the sinus tarsi. No significant arthritic changes are seen within the ankle and foot. Lisfranc ligament is intact. Visualized flexor, extensor and peroneal tendons are also normal without evidence of tenosynovitis, tendon degeneration or tear. Inferior syndesmotic, talofibular, calcaneofibular and deltoid ligaments are also normal. Intrinsic muscles of the forefoot are also unremarkable without evidence of a definite mass or denervation atrophy. Plantar aponeurosis is normal without fasciitis, fibromatosis or tear. There is moderate subcutaneous edema.
IMPRESSION: Moderate edema within the anterior talus. 

Abnormal low T1 signal intensity within the sinus tarsi suggestive of sinus tarsi syndrome. 

Moderate subcutaneous edema.

## 2019-03-25 IMAGING — US CAROTID US W/DOPPLER
1 series · 14 of 24 positions shown · non-contrast
Comparison: 03/29/2018.

EXAM:  CAROTID US W/DOPPLER
INDICATION: Right carotid bruit.

[Series 3: carotid us w/doppler · 0.07mm/px · 14 of 74 slices shown]
[im 1/74]
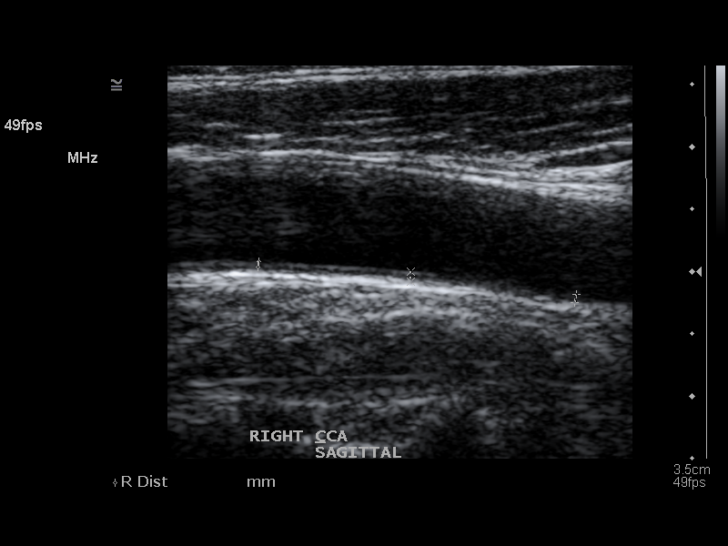
[im 7/74]
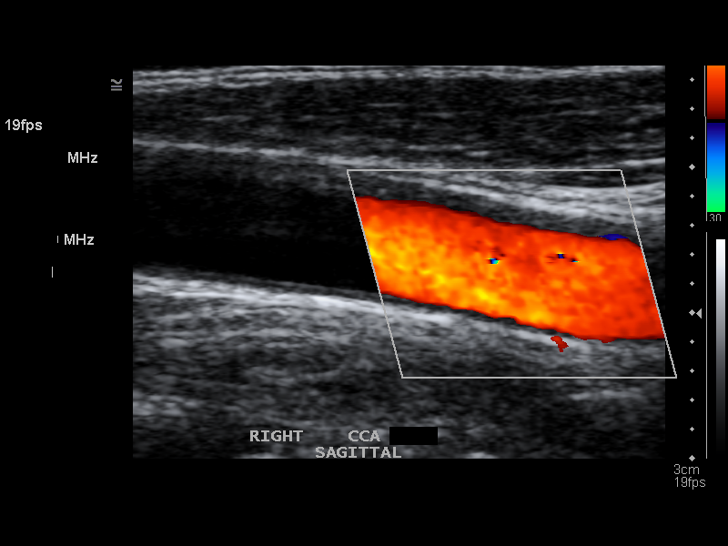
[im 13/74]
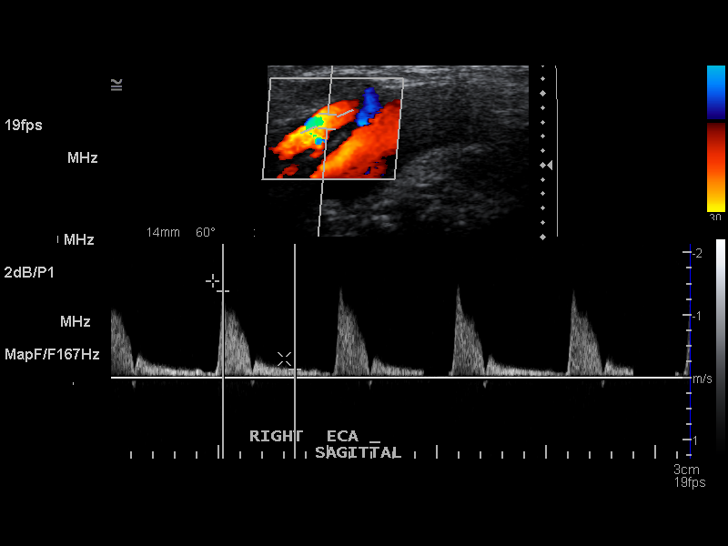
[im 20/74]
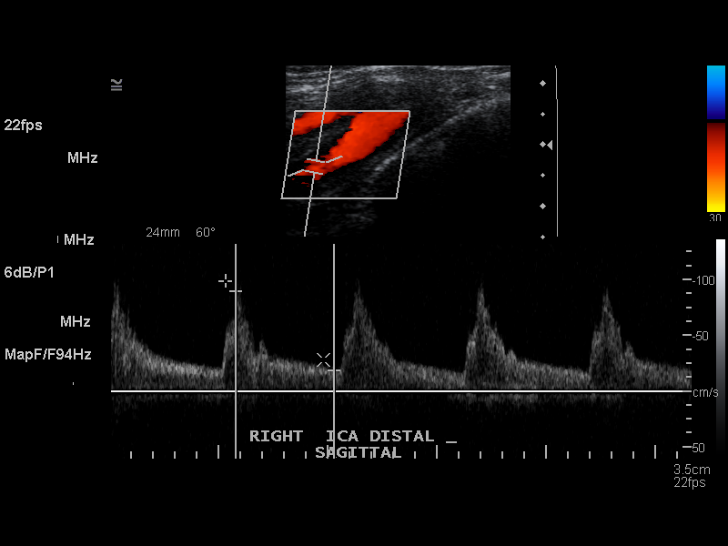
[im 23/74]
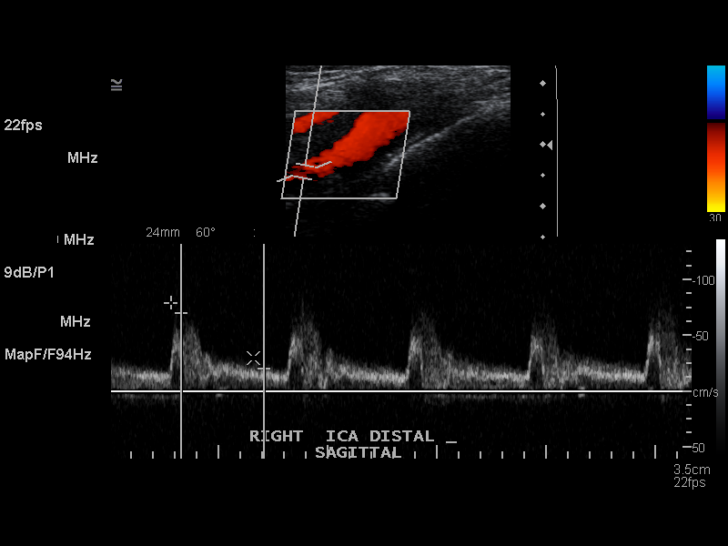
[im 29/74]
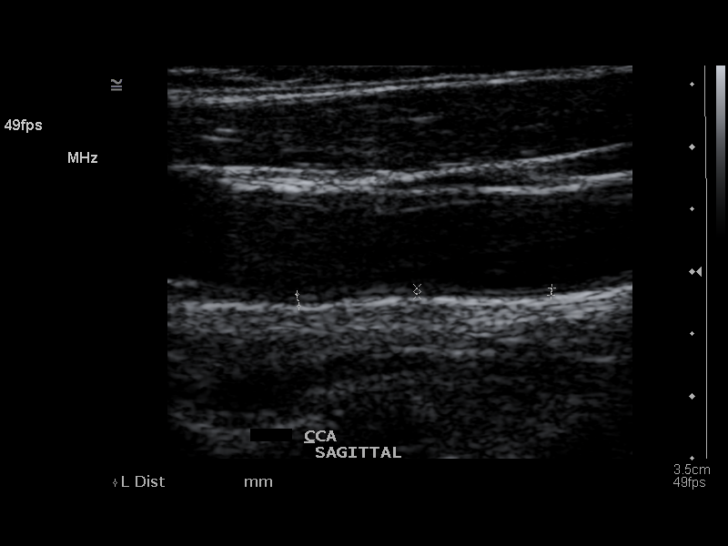
[im 35/74]
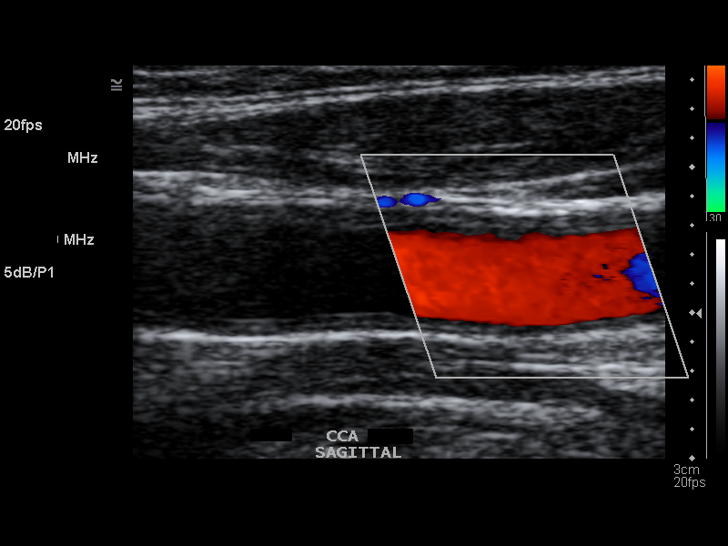
[im 39/74]
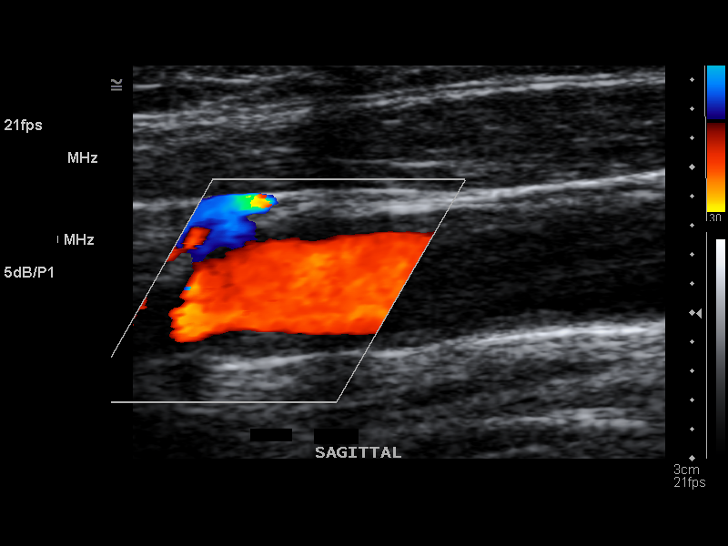
[im 45/74]
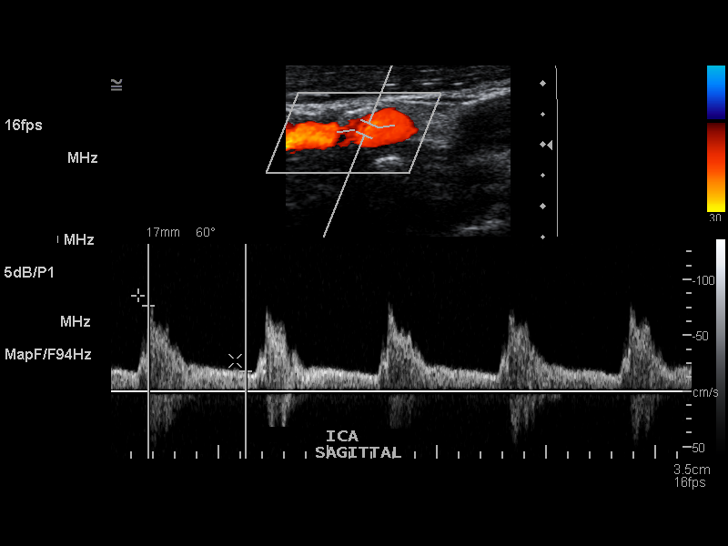
[im 51/74]
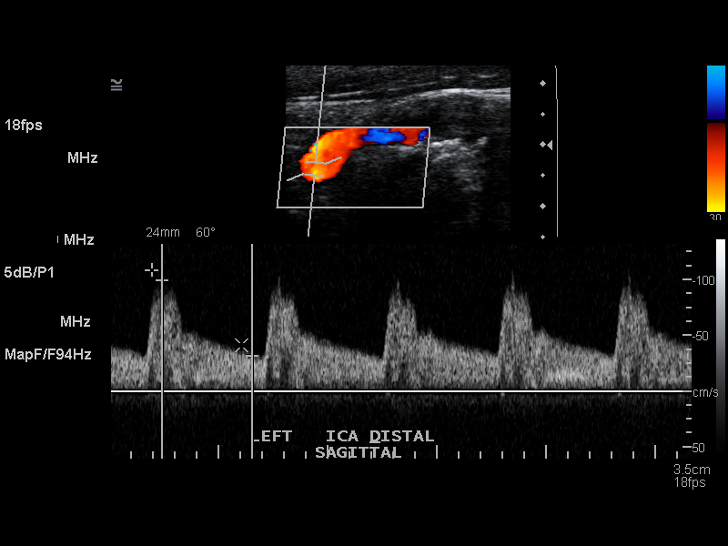
[im 58/74]
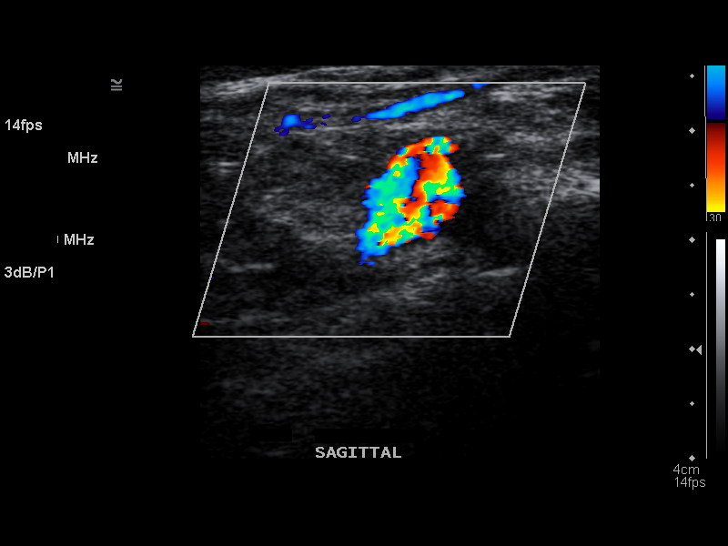
[im 61/74]
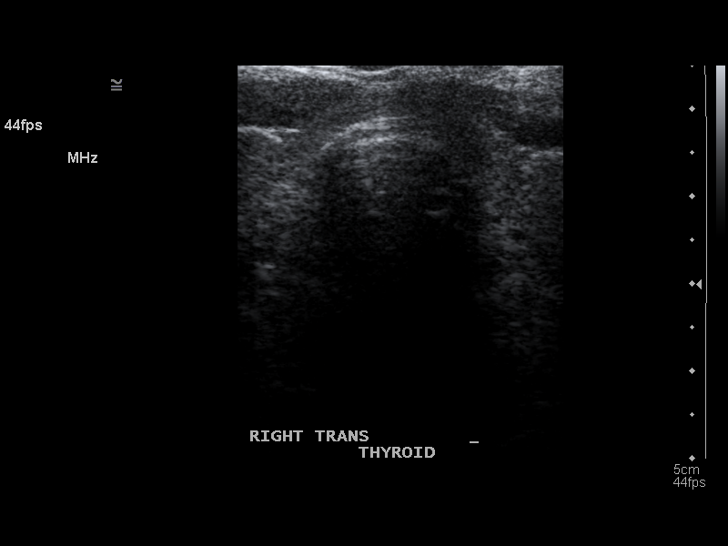
[im 67/74]
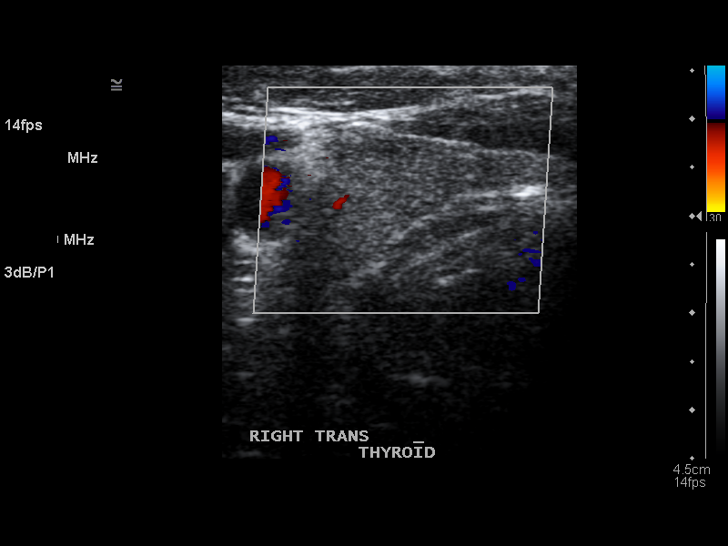
[im 74/74]
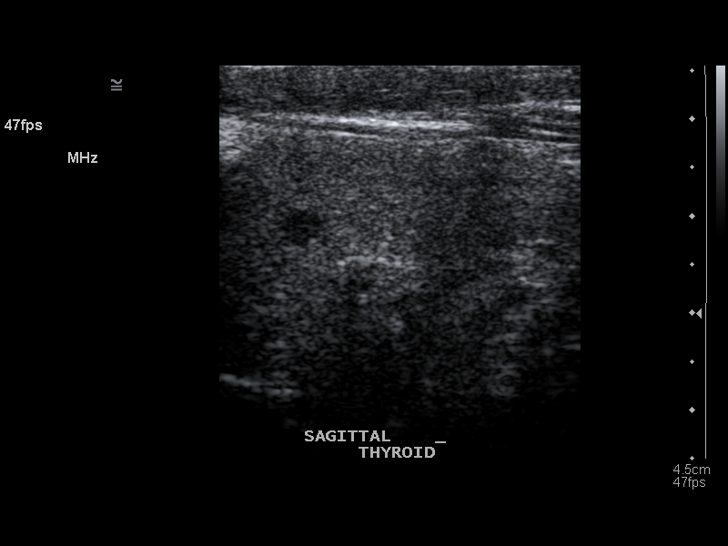

[14 of 24 positions shown; findings below may reference images not displayed]

FINDINGS: Right CCA

79

Left CCA

75

PSV

IC/CC

EDV

Right Internal

101

1.11

17

Left Internal

99

1.31

31

Right Vertebral

Antegrade

Left Vertebral

Antegrade

Small amount of heterogeneous atherosclerotic plaque within the carotid bulbs is again identified. No hemodynamically significant stenosis or abnormal elevation of velocity is seen at any level. Both vertebral arteries are patent with appropriate antegrade direction of flow.
IMPRESSION: No hemodynamically significant stenosis within the internal carotid arteries as per NASCET criteria.

## 2020-01-13 IMAGING — MR MRI LUMBAR SPINE WITHOUT AND WITH CONTRAST
7 series · 48 of 48 positions shown · IV contrast (gadavist)
Comparison: Radiographs dated 01/11/2018.

﻿EXAM:  MRI LUMBAR SPINE WITHOUT AND WITH CONTRAST
INDICATION: Lower back pain with right lower extremity radiculopathy. History of back surgery 1 year ago.
TECHNIQUE: Multiplanar multisequential MRI of the lumbar spine was performed without and with 5 mL of Gadavist.

[Series 6: T2 · sagittal · 4.0mm · 1.01mm/px · 5 of 13 slices shown]
[im 1/13]
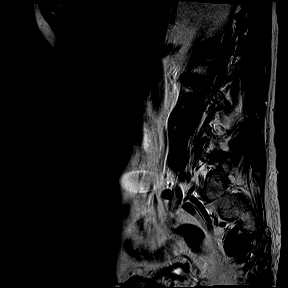
[im 4/13]
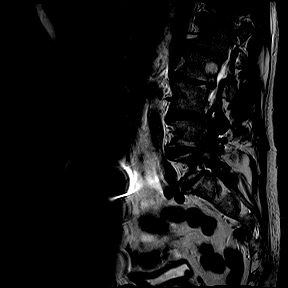
[im 7/13]
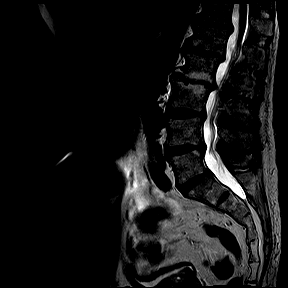
[im 10/13]
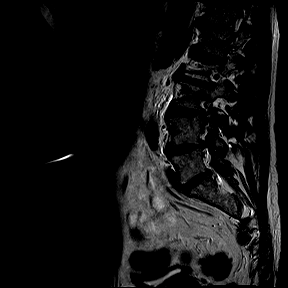
[im 13/13]
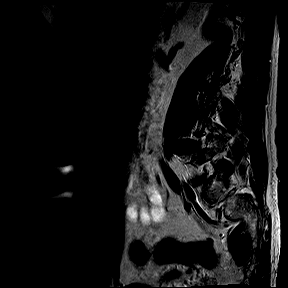

[Series 7: T1 · sagittal · 4.0mm · 1.01mm/px · 5 of 13 slices shown (1 of 4)]
[im 1/13]
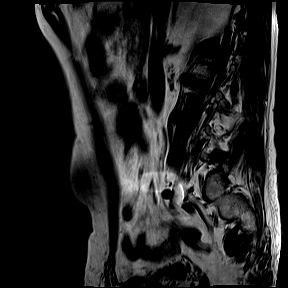
[im 4/13]
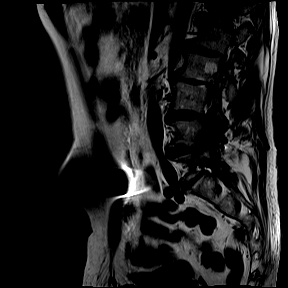
[im 7/13]
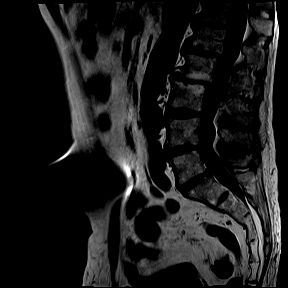
[im 10/13]
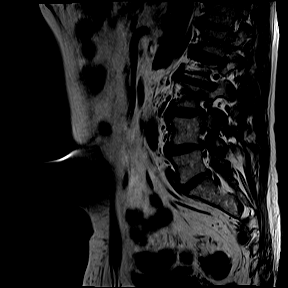
[im 13/13]
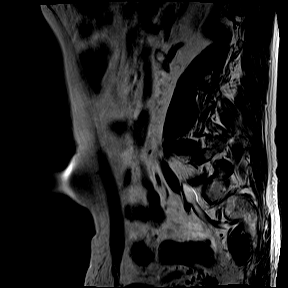

[Series 9: STIR · sagittal · 4.0mm · 1.13mm/px · 6 of 13 slices shown]
[im 1/13]
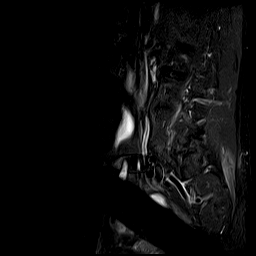
[im 3/13]
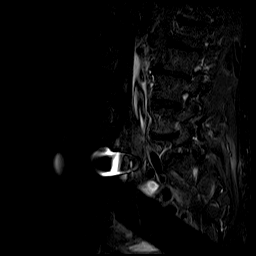
[im 5/13]
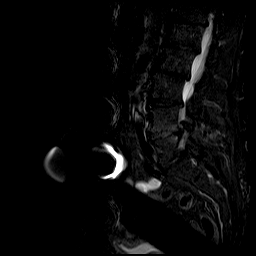
[im 8/13]
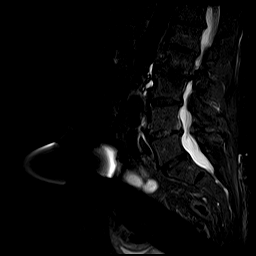
[im 10/13]
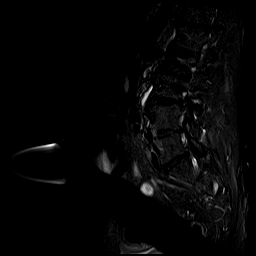
[im 13/13]
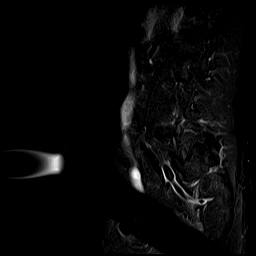

[Series 12: T1 · sagittal · 4.0mm · 1.13mm/px · 6 of 13 slices shown (2 of 4)]
[im 1/13]
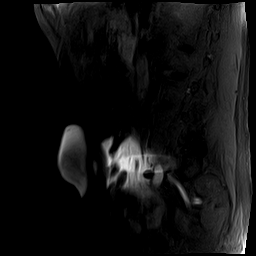
[im 3/13]
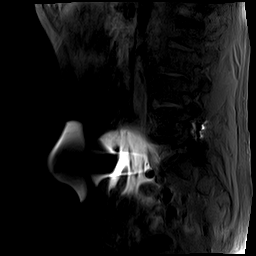
[im 5/13]
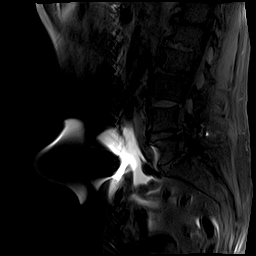
[im 8/13]
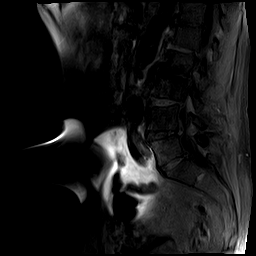
[im 10/13]
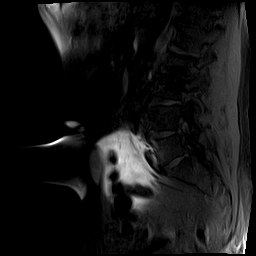
[im 13/13]
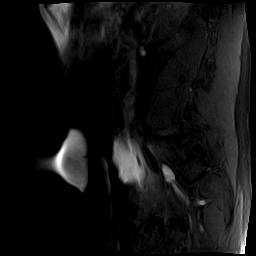

[Series 13: T1 fat-sat · axial · 4.0mm · 0.70mm/px · z∈[-87,+113]mm · 10 of 23 slices shown]
[im 1/23]
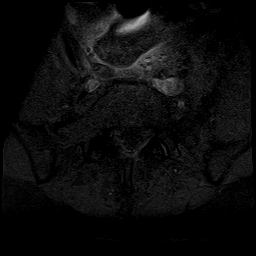
[im 3/23]
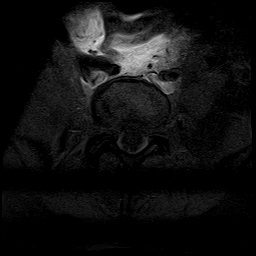
[im 5/23]
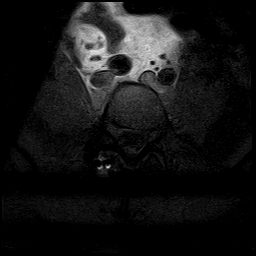
[im 8/23]
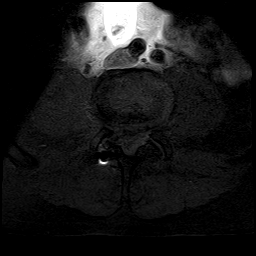
[im 10/23]
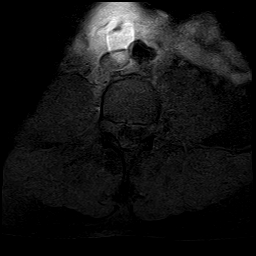
[im 13/23]
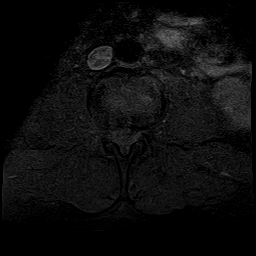
[im 15/23]
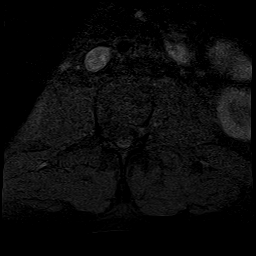
[im 18/23]
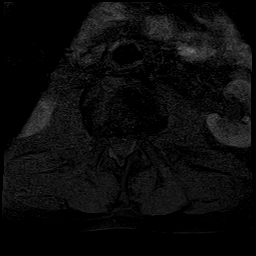
[im 20/23]
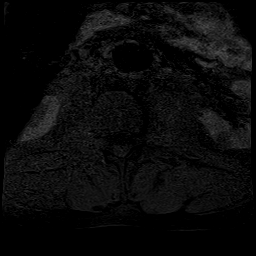
[im 23/23]
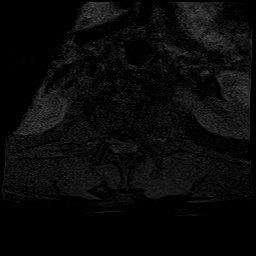

[Series 14: T1 · axial · 4.0mm · 0.70mm/px · z∈[-87,+113]mm · 10 of 23 slices shown (3 of 4)]
[im 1/23]
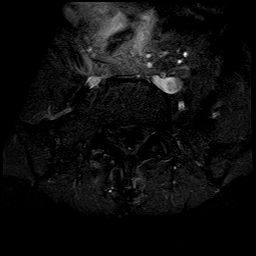
[im 3/23]
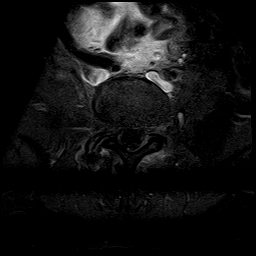
[im 5/23]
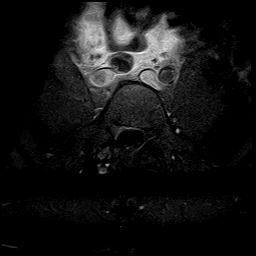
[im 8/23]
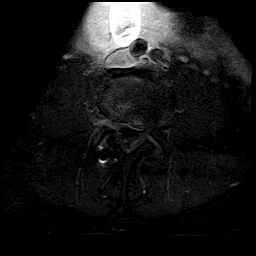
[im 10/23]
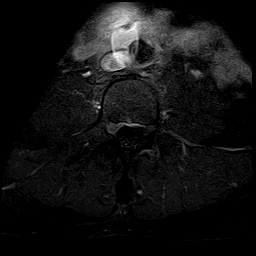
[im 13/23]
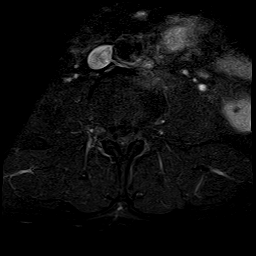
[im 15/23]
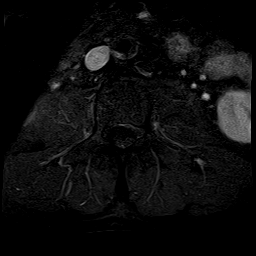
[im 18/23]
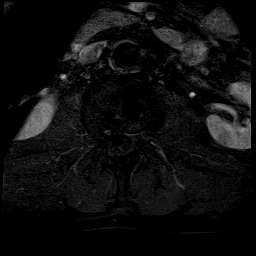
[im 20/23]
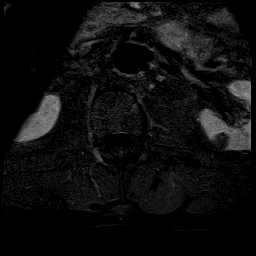
[im 23/23]
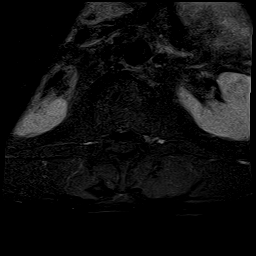

[Series 15: T1 · sagittal · 4.0mm · 1.13mm/px · 6 of 13 slices shown (4 of 4)]
[im 1/13]
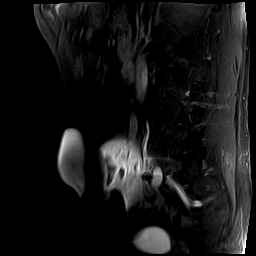
[im 3/13]
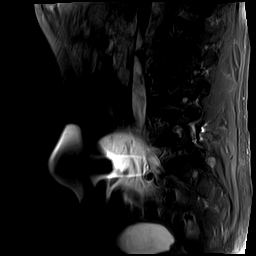
[im 5/13]
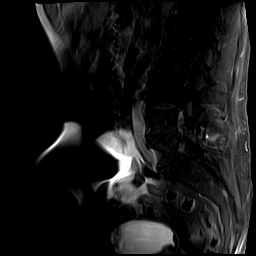
[im 8/13]
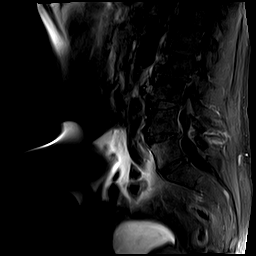
[im 10/13]
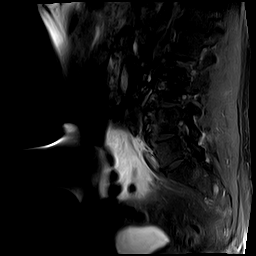
[im 13/13]
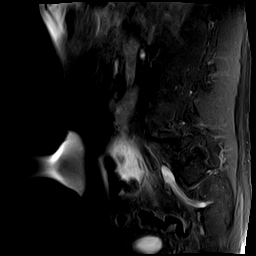

[48 of 48 positions shown; findings below may reference images not displayed]

FINDINGS: There is a mild dextroscoliosis centered at L1-2 disc space level. Bone marrow signal intensity is normal. There is no acute fracture or subluxation. Distal spinal cord is normal in signal intensity and terminates normally at T12-L1 disc space level. Spinal canal is congenitally narrow.

At T12-L1 level, there is marked disc desiccation. There is a small broad-based central disc bulge, mildly effacing the ventral thecal sac. There is no significant neural foraminal stenosis.

At L1-2 level, there is grade 1 retrolisthesis of L1 on L2 vertebral body. There is a small broad-based central disc bulge, mildly effacing the ventral thecal sac. There is moderate left neural foraminal stenosis from bulging annulus and facet arthropathy.

At L2-3 level, there is marked disc desiccation. Modic type 2 changes are also noted within the inferior endplate of L2 and superior endplate of L3 vertebral body. There is also grade 1 retrolisthesis of L2 on L3 vertebral body. There is a small broad-based central disc bulge resulting in moderate spinal stenosis. There is moderate bilateral neural foraminal stenosis from facet arthropathy and bulging annulus.

At L3-4 level, there is a grade 1 retrolisthesis of L3 on L4 vertebral body. There is a small broad-based central disc bulge resulting in moderate to severe spinal stenosis. There is also suggestion of a 13.5 mm right paracentral extruded disc fragment extending inferiorly from the disc and resulting in severe L4 nerve root compression. There is moderate left and mild right neural foraminal stenosis from facet arthropathy and bulging annulus.

At L4-5 level, there is a right hemilaminectomy defect. A moderate broad-based central disc bulge is also noted resulting in moderate spinal stenosis. There is moderate left and severe right neural foraminal stenosis from facet arthropathy and bulging annulus.

At L5-S1 level, there is a small broad-based central disc bulge, mildly effacing the ventral thecal sac. There is moderate bilateral neural foraminal stenosis from facet arthropathy and bulging annulus.

Following intravenous contrast administration, there is no abnormal nerve root or paraspinal soft tissue enhancement.
IMPRESSION: 1. Right hemilaminectomy defect at L4-5 level. 

2. Grade 1 retrolisthesis of L1 on L2, L2 on L3 and L3 on L4 vertebral body. 

3. Moderate spinal stenosis at L2-3 and L4-5 levels and moderate to severe spinal stenosis at L3-4 level from central disc bulges. 

4. A 13.5 mm right paracentral extruded disc fragment extending inferiorly from L3-4 disc space and resulting in severe right L4 nerve root compression. There is also severe right neural foraminal stenosis at L4-5 level from facet arthropathy and bulging annulus.

## 2020-05-03 IMAGING — CT LUNG CANCER SCREENING
2 of 3 series · 15 of 36 positions shown, 18 images · non-contrast
Comparison: Chest radiograph dated 03/25/2019 and CT scan dated 01/31/2017.

﻿EXAM:  LUNG CANCER SCREENING
INDICATION: 50 year smoking history.
TECHNIQUE: Low-dose helical noncontrast CT imaging of the chest was performed. Images were reviewed in multiple windows and projections. Exam was performed using 1 or more of the following dose reduction techniques: Automated exposure control, adjustment of the mA and/or kV according to patient size, or the use of iterative reconstruction technique.

[axial · axial · 0.79mm/px · z∈[-1070,-786]mm · 12 of 85 slices shown, 15 images]
[im 7/85  mediastinal]
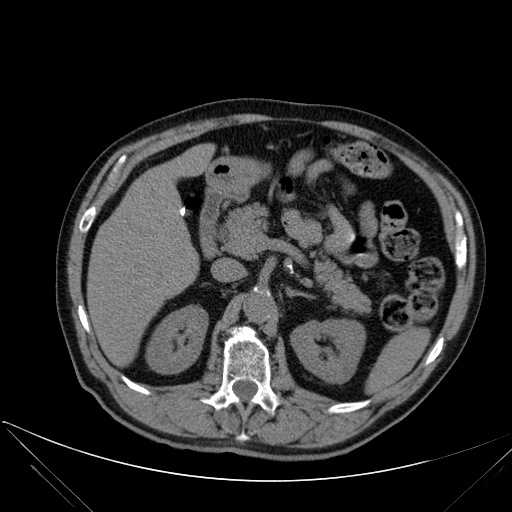
[im 7/85  lung]
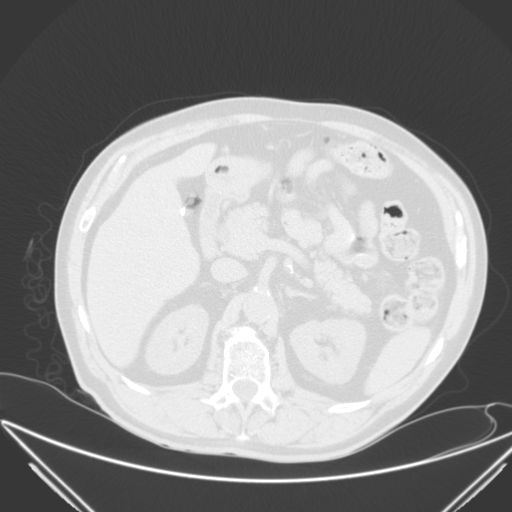
[im 13/85  lung]
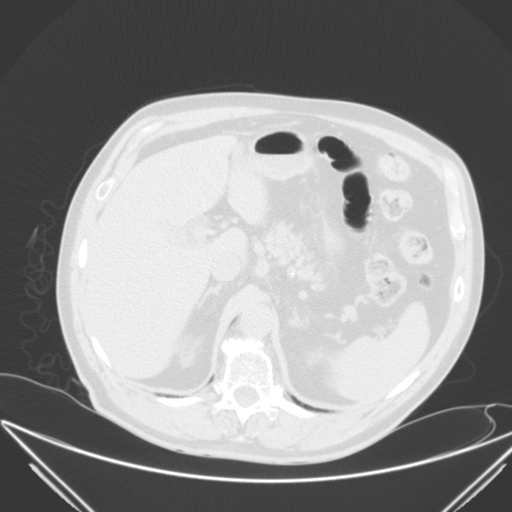
[im 19/85  lung]
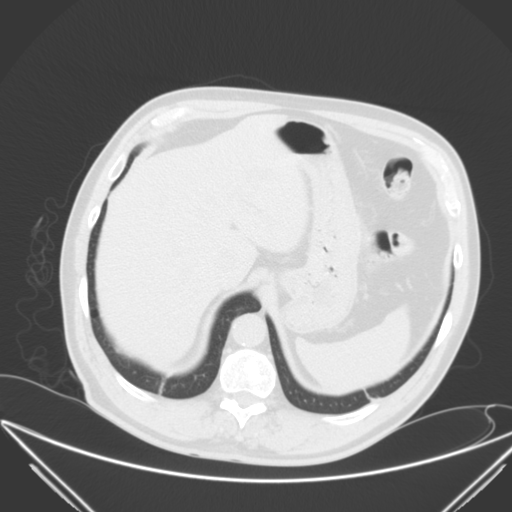
[im 25/85  lung]
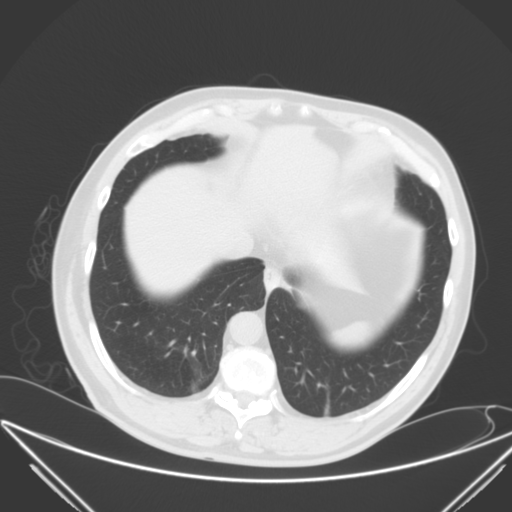
[im 32/85  mediastinal]
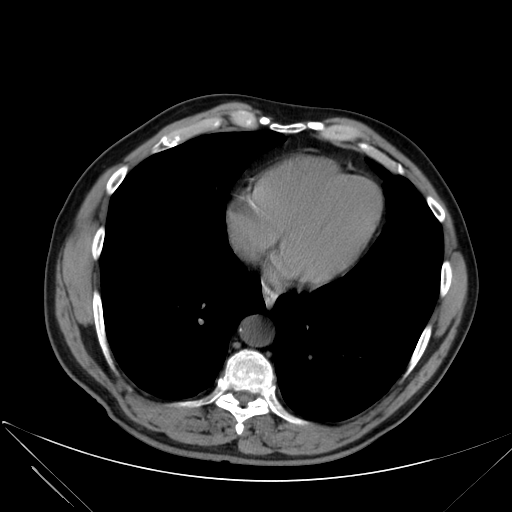
[im 32/85  lung]
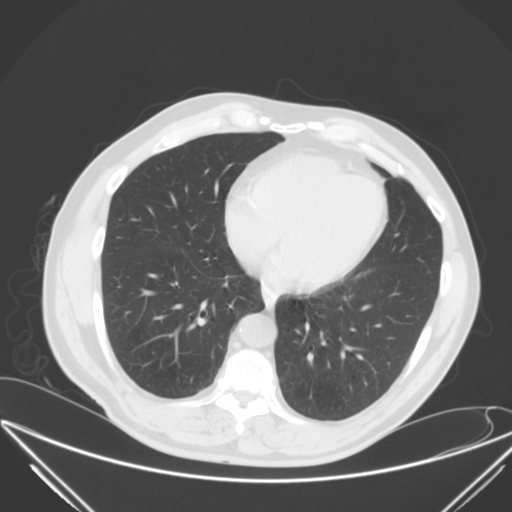
[im 38/85  lung]
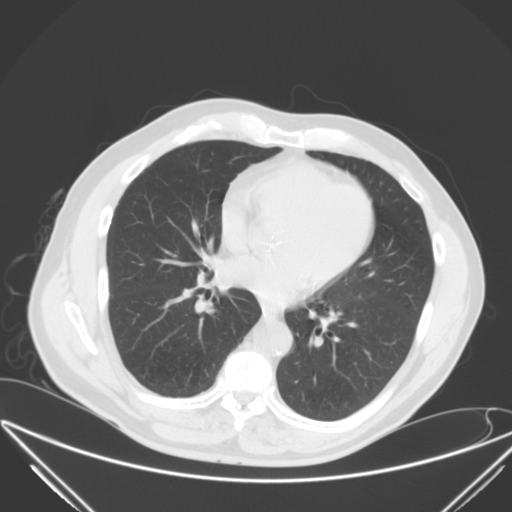
[im 47/85  lung]
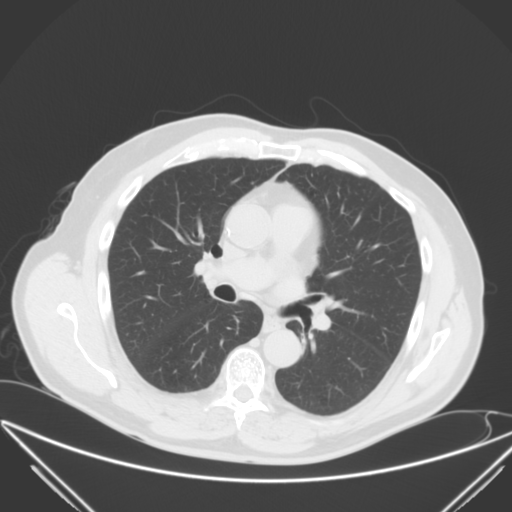
[im 53/85  lung]
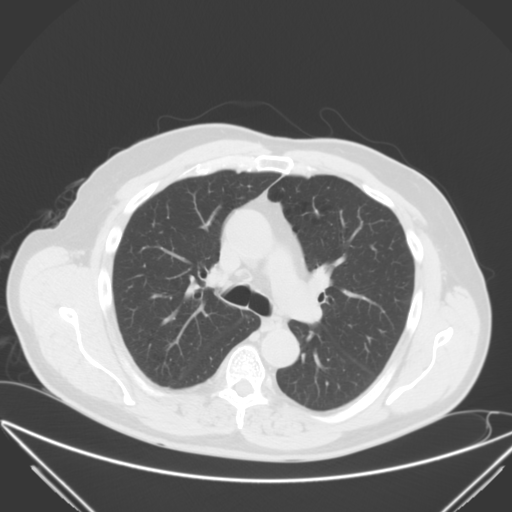
[im 60/85  mediastinal]
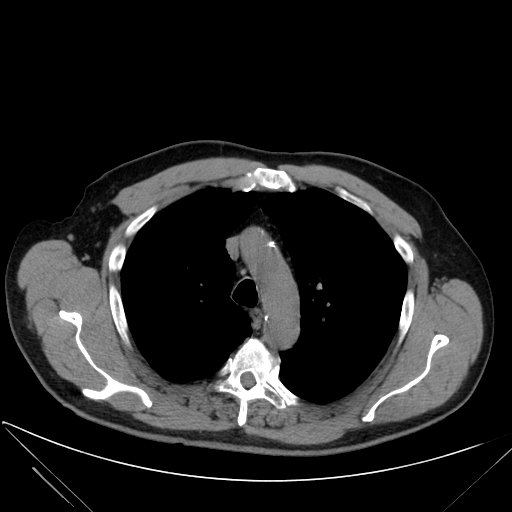
[im 60/85  lung]
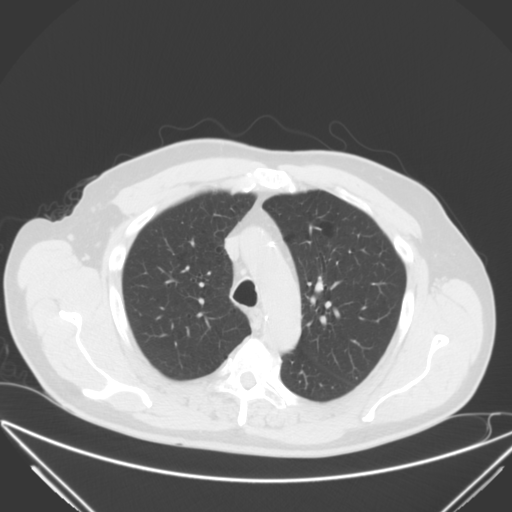
[im 66/85  lung]
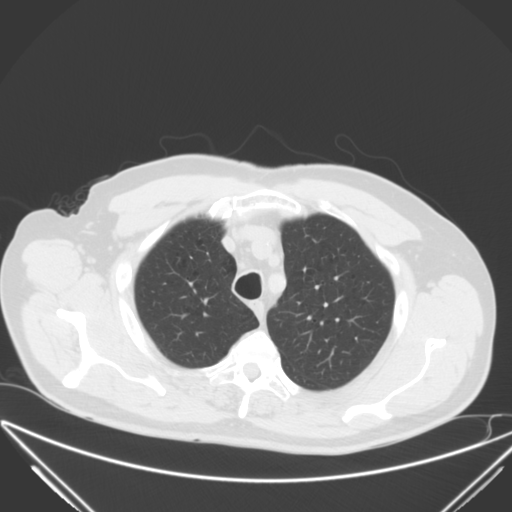
[im 72/85  lung]
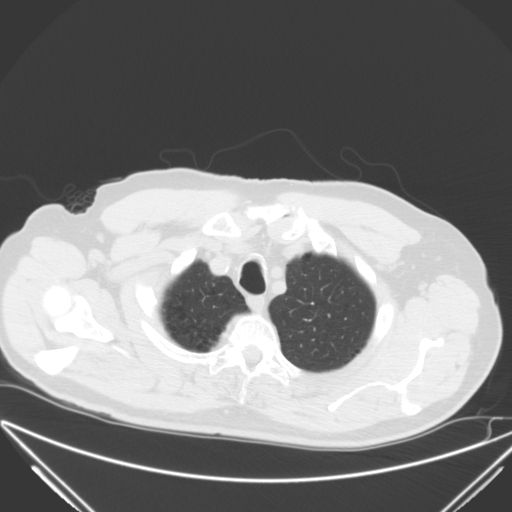
[im 78/85  lung]
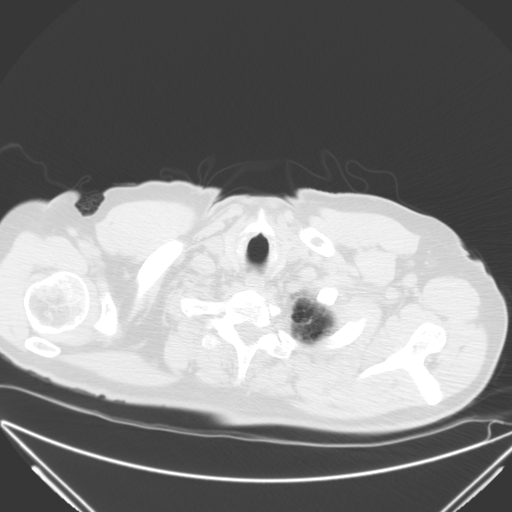

[cor · coronal · 0.79mm/px · 3 of 75 slices shown]
[im 15/75  lung]
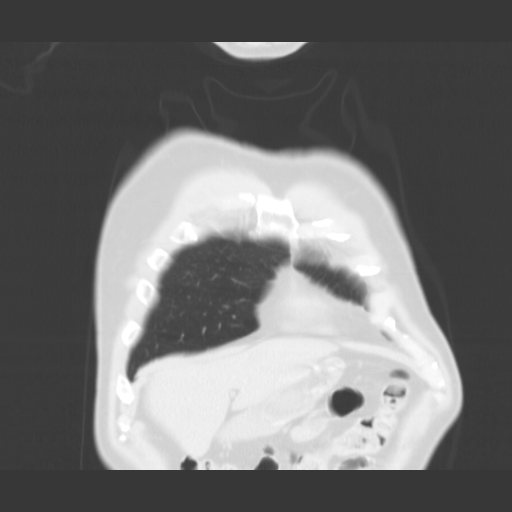
[im 30/75  lung]
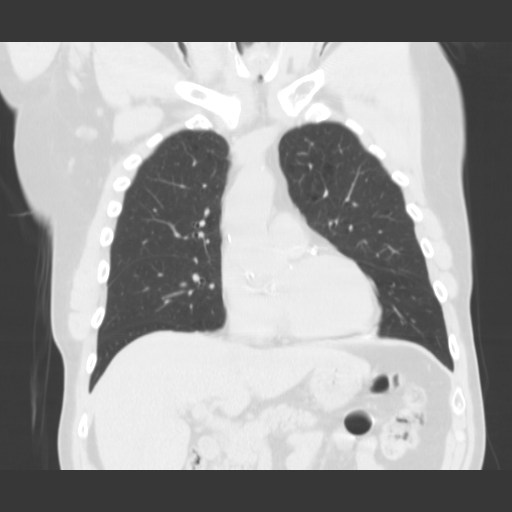
[im 45/75  lung]
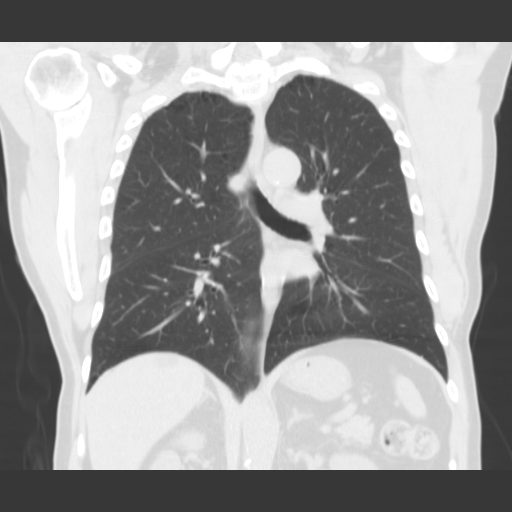

[15 of 36 positions shown; findings below may reference images not displayed]

FINDINGS: Thyroid gland appears unremarkable. Trachea and mainstem bronchi are patent. There is no mediastinal or axillary adenopathy. Evaluation for hilar adenopathy is limited due to the lack of intravenous contrast. There are mild emphysematous changes within the upper lobes. There is no pleural or pericardial effusion. There is no lung consolidation. No suspicious lung nodule is seen. There is no aortic aneurysm. Coronary arteries are heavily calcified. There is a 2.5 cm superior right hepatic lobe cyst. Gallbladder is surgically absent. Advanced arthritic changes seen within the visualized lumbar spine.
IMPRESSION: Lung rads 1-negative exam. Repeat low-dose chest CT scan is recommended in 12 months for reassessment.

## 2021-07-13 HISTORY — PX: CARDIAC CATHETERIZATION: SHX172

## 2021-08-03 DIAGNOSIS — Z951 Presence of aortocoronary bypass graft: Secondary | ICD-10-CM | POA: Insufficient documentation

## 2021-08-03 HISTORY — PX: HX CORONARY ARTERY BYPASS GRAFT: SHX141

## 2021-08-03 HISTORY — DX: Presence of aortocoronary bypass graft: Z95.1

## 2021-08-12 ENCOUNTER — Other Ambulatory Visit (HOSPITAL_COMMUNITY): Payer: Self-pay

## 2021-08-12 ENCOUNTER — Other Ambulatory Visit (HOSPITAL_COMMUNITY): Payer: Self-pay | Admitting: Internal Medicine

## 2021-08-12 DIAGNOSIS — I252 Old myocardial infarction: Secondary | ICD-10-CM

## 2021-08-15 ENCOUNTER — Other Ambulatory Visit: Payer: Self-pay

## 2021-08-15 ENCOUNTER — Ambulatory Visit
Admission: RE | Admit: 2021-08-15 | Discharge: 2021-08-15 | Disposition: A | Discharge status: Still In Hospital | Payer: Medicare PPO | Source: Ambulatory Visit | Attending: Internal Medicine | Admitting: Internal Medicine

## 2021-08-15 ENCOUNTER — Other Ambulatory Visit (HOSPITAL_COMMUNITY): Payer: Self-pay | Admitting: Internal Medicine

## 2021-08-15 DIAGNOSIS — I252 Old myocardial infarction: Secondary | ICD-10-CM

## 2021-08-17 ENCOUNTER — Ambulatory Visit (HOSPITAL_COMMUNITY): Payer: Medicare PPO

## 2021-08-19 ENCOUNTER — Ambulatory Visit
Admission: RE | Admit: 2021-08-19 | Discharge: 2021-08-19 | Disposition: A | Discharge status: Still In Hospital | Payer: Medicare PPO | Source: Ambulatory Visit | Attending: Internal Medicine | Admitting: Internal Medicine

## 2021-08-19 ENCOUNTER — Other Ambulatory Visit: Payer: Self-pay

## 2021-08-19 DIAGNOSIS — R011 Cardiac murmur, unspecified: Secondary | ICD-10-CM | POA: Insufficient documentation

## 2021-08-19 DIAGNOSIS — I252 Old myocardial infarction: Secondary | ICD-10-CM | POA: Insufficient documentation

## 2021-08-19 DIAGNOSIS — E785 Hyperlipidemia, unspecified: Secondary | ICD-10-CM | POA: Insufficient documentation

## 2021-08-22 ENCOUNTER — Other Ambulatory Visit: Payer: Self-pay

## 2021-08-22 ENCOUNTER — Ambulatory Visit
Admission: RE | Admit: 2021-08-22 | Discharge: 2021-08-22 | Disposition: A | Discharge status: Still In Hospital | Payer: Medicare PPO | Source: Ambulatory Visit | Attending: Internal Medicine | Admitting: Internal Medicine

## 2021-08-22 ENCOUNTER — Other Ambulatory Visit (INDEPENDENT_AMBULATORY_CARE_PROVIDER_SITE_OTHER): Payer: Self-pay | Admitting: Family Medicine

## 2021-08-22 DIAGNOSIS — Z951 Presence of aortocoronary bypass graft: Secondary | ICD-10-CM | POA: Insufficient documentation

## 2021-08-22 DIAGNOSIS — R011 Cardiac murmur, unspecified: Secondary | ICD-10-CM | POA: Insufficient documentation

## 2021-08-22 DIAGNOSIS — E785 Hyperlipidemia, unspecified: Secondary | ICD-10-CM | POA: Insufficient documentation

## 2021-08-22 MED ORDER — FAMOTIDINE 40 MG TABLET
40.00 mg | ORAL_TABLET | Freq: Two times a day (BID) | ORAL | 1 refills | Status: DC
Start: 2021-08-22 — End: 2022-02-14

## 2021-08-22 MED ORDER — TAMSULOSIN 0.4 MG CAPSULE
0.4000 mg | ORAL_CAPSULE | Freq: Every day | ORAL | 1 refills | Status: DC
Start: 2021-08-22 — End: 2022-03-05

## 2021-08-22 MED ORDER — FINASTERIDE 5 MG TABLET
5.0000 mg | ORAL_TABLET | Freq: Every day | ORAL | 1 refills | Status: DC
Start: 2021-08-22 — End: 2022-03-05

## 2021-08-24 ENCOUNTER — Ambulatory Visit
Admission: RE | Admit: 2021-08-24 | Discharge: 2021-08-24 | Disposition: A | Discharge status: Still In Hospital | Payer: Medicare PPO | Source: Ambulatory Visit | Attending: Internal Medicine | Admitting: Internal Medicine

## 2021-08-24 ENCOUNTER — Other Ambulatory Visit: Payer: Self-pay

## 2021-08-24 DIAGNOSIS — R011 Cardiac murmur, unspecified: Secondary | ICD-10-CM | POA: Insufficient documentation

## 2021-08-24 DIAGNOSIS — E785 Hyperlipidemia, unspecified: Secondary | ICD-10-CM | POA: Insufficient documentation

## 2021-08-25 ENCOUNTER — Encounter (INDEPENDENT_AMBULATORY_CARE_PROVIDER_SITE_OTHER): Payer: Self-pay | Admitting: Family Medicine

## 2021-08-26 ENCOUNTER — Other Ambulatory Visit: Payer: Self-pay

## 2021-08-26 ENCOUNTER — Ambulatory Visit
Admission: RE | Admit: 2021-08-26 | Discharge: 2021-08-26 | Disposition: A | Discharge status: Still In Hospital | Payer: Medicare PPO | Source: Ambulatory Visit | Attending: Internal Medicine | Admitting: Internal Medicine

## 2021-08-26 DIAGNOSIS — E785 Hyperlipidemia, unspecified: Secondary | ICD-10-CM | POA: Insufficient documentation

## 2021-08-26 DIAGNOSIS — R011 Cardiac murmur, unspecified: Secondary | ICD-10-CM | POA: Insufficient documentation

## 2021-08-29 ENCOUNTER — Encounter (INDEPENDENT_AMBULATORY_CARE_PROVIDER_SITE_OTHER): Payer: Self-pay | Admitting: Family Medicine

## 2021-08-29 ENCOUNTER — Other Ambulatory Visit: Payer: Self-pay

## 2021-08-29 ENCOUNTER — Ambulatory Visit
Admission: RE | Admit: 2021-08-29 | Discharge: 2021-08-29 | Disposition: A | Discharge status: Still In Hospital | Payer: Medicare PPO | Source: Ambulatory Visit | Attending: Internal Medicine | Admitting: Internal Medicine

## 2021-08-29 DIAGNOSIS — R011 Cardiac murmur, unspecified: Secondary | ICD-10-CM

## 2021-08-29 DIAGNOSIS — E785 Hyperlipidemia, unspecified: Secondary | ICD-10-CM | POA: Insufficient documentation

## 2021-08-29 DIAGNOSIS — R5383 Other fatigue: Secondary | ICD-10-CM | POA: Insufficient documentation

## 2021-08-29 DIAGNOSIS — K219 Gastro-esophageal reflux disease without esophagitis: Secondary | ICD-10-CM | POA: Insufficient documentation

## 2021-08-29 DIAGNOSIS — G4733 Obstructive sleep apnea (adult) (pediatric): Secondary | ICD-10-CM | POA: Insufficient documentation

## 2021-08-29 DIAGNOSIS — R0989 Other specified symptoms and signs involving the circulatory and respiratory systems: Secondary | ICD-10-CM | POA: Insufficient documentation

## 2021-08-29 HISTORY — DX: Other fatigue: R53.83

## 2021-08-30 ENCOUNTER — Other Ambulatory Visit (HOSPITAL_COMMUNITY): Payer: Self-pay | Admitting: Internal Medicine

## 2021-08-30 ENCOUNTER — Encounter (INDEPENDENT_AMBULATORY_CARE_PROVIDER_SITE_OTHER): Payer: Medicare PPO | Admitting: Family Medicine

## 2021-08-31 ENCOUNTER — Other Ambulatory Visit: Payer: Self-pay

## 2021-08-31 ENCOUNTER — Ambulatory Visit
Admission: RE | Admit: 2021-08-31 | Discharge: 2021-08-31 | Disposition: A | Discharge status: Still In Hospital | Payer: Medicare PPO | Source: Ambulatory Visit | Attending: Internal Medicine | Admitting: Internal Medicine

## 2021-08-31 DIAGNOSIS — R011 Cardiac murmur, unspecified: Secondary | ICD-10-CM | POA: Insufficient documentation

## 2021-08-31 DIAGNOSIS — E785 Hyperlipidemia, unspecified: Secondary | ICD-10-CM | POA: Insufficient documentation

## 2021-09-02 ENCOUNTER — Ambulatory Visit
Admission: RE | Admit: 2021-09-02 | Discharge: 2021-09-02 | Disposition: A | Discharge status: Still In Hospital | Payer: Medicare PPO | Source: Ambulatory Visit | Attending: Internal Medicine | Admitting: Internal Medicine

## 2021-09-02 ENCOUNTER — Other Ambulatory Visit: Payer: Self-pay

## 2021-09-02 DIAGNOSIS — R011 Cardiac murmur, unspecified: Secondary | ICD-10-CM | POA: Insufficient documentation

## 2021-09-02 DIAGNOSIS — E785 Hyperlipidemia, unspecified: Secondary | ICD-10-CM | POA: Insufficient documentation

## 2021-09-05 ENCOUNTER — Ambulatory Visit
Admission: RE | Admit: 2021-09-05 | Discharge: 2021-09-05 | Disposition: A | Discharge status: Still In Hospital | Payer: Medicare PPO | Source: Ambulatory Visit | Attending: Internal Medicine | Admitting: Internal Medicine

## 2021-09-05 ENCOUNTER — Other Ambulatory Visit: Payer: Self-pay

## 2021-09-05 DIAGNOSIS — E785 Hyperlipidemia, unspecified: Secondary | ICD-10-CM | POA: Insufficient documentation

## 2021-09-05 DIAGNOSIS — Z951 Presence of aortocoronary bypass graft: Secondary | ICD-10-CM | POA: Insufficient documentation

## 2021-09-05 DIAGNOSIS — R011 Cardiac murmur, unspecified: Secondary | ICD-10-CM | POA: Insufficient documentation

## 2021-09-07 ENCOUNTER — Ambulatory Visit
Admission: RE | Admit: 2021-09-07 | Discharge: 2021-09-07 | Disposition: A | Discharge status: Still In Hospital | Payer: Medicare PPO | Source: Ambulatory Visit | Attending: Internal Medicine | Admitting: Internal Medicine

## 2021-09-07 ENCOUNTER — Other Ambulatory Visit (INDEPENDENT_AMBULATORY_CARE_PROVIDER_SITE_OTHER): Payer: Self-pay | Admitting: Family Medicine

## 2021-09-07 ENCOUNTER — Other Ambulatory Visit: Payer: Self-pay

## 2021-09-07 DIAGNOSIS — R011 Cardiac murmur, unspecified: Secondary | ICD-10-CM | POA: Insufficient documentation

## 2021-09-09 ENCOUNTER — Other Ambulatory Visit: Payer: Self-pay

## 2021-09-09 ENCOUNTER — Ambulatory Visit
Admission: RE | Admit: 2021-09-09 | Discharge: 2021-09-09 | Disposition: A | Discharge status: Still In Hospital | Payer: Medicare PPO | Source: Ambulatory Visit | Attending: Internal Medicine | Admitting: Internal Medicine

## 2021-09-09 ENCOUNTER — Encounter (INDEPENDENT_AMBULATORY_CARE_PROVIDER_SITE_OTHER): Payer: Medicare PPO | Admitting: Family

## 2021-09-09 DIAGNOSIS — I10 Essential (primary) hypertension: Secondary | ICD-10-CM | POA: Insufficient documentation

## 2021-09-09 DIAGNOSIS — Z951 Presence of aortocoronary bypass graft: Secondary | ICD-10-CM | POA: Insufficient documentation

## 2021-09-12 ENCOUNTER — Other Ambulatory Visit: Payer: Self-pay

## 2021-09-12 ENCOUNTER — Ambulatory Visit
Admission: RE | Admit: 2021-09-12 | Discharge: 2021-09-12 | Disposition: A | Discharge status: Still In Hospital | Payer: Medicare PPO | Source: Ambulatory Visit | Attending: Internal Medicine | Admitting: Internal Medicine

## 2021-09-12 DIAGNOSIS — Z951 Presence of aortocoronary bypass graft: Secondary | ICD-10-CM | POA: Insufficient documentation

## 2021-09-12 DIAGNOSIS — R011 Cardiac murmur, unspecified: Secondary | ICD-10-CM | POA: Insufficient documentation

## 2021-09-14 ENCOUNTER — Other Ambulatory Visit: Payer: Self-pay

## 2021-09-14 ENCOUNTER — Ambulatory Visit
Admission: RE | Admit: 2021-09-14 | Discharge: 2021-09-14 | Disposition: A | Discharge status: Still In Hospital | Payer: Medicare PPO | Source: Ambulatory Visit | Attending: Internal Medicine | Admitting: Internal Medicine

## 2021-09-14 DIAGNOSIS — I2510 Atherosclerotic heart disease of native coronary artery without angina pectoris: Secondary | ICD-10-CM | POA: Insufficient documentation

## 2021-09-14 DIAGNOSIS — Z951 Presence of aortocoronary bypass graft: Secondary | ICD-10-CM | POA: Insufficient documentation

## 2021-09-16 ENCOUNTER — Other Ambulatory Visit: Payer: Self-pay

## 2021-09-16 ENCOUNTER — Ambulatory Visit
Admission: RE | Admit: 2021-09-16 | Discharge: 2021-09-16 | Disposition: A | Discharge status: Still In Hospital | Payer: Medicare PPO | Source: Ambulatory Visit | Attending: Internal Medicine | Admitting: Internal Medicine

## 2021-09-16 DIAGNOSIS — I2510 Atherosclerotic heart disease of native coronary artery without angina pectoris: Secondary | ICD-10-CM | POA: Insufficient documentation

## 2021-09-16 DIAGNOSIS — Z951 Presence of aortocoronary bypass graft: Secondary | ICD-10-CM | POA: Insufficient documentation

## 2021-09-16 DIAGNOSIS — R011 Cardiac murmur, unspecified: Secondary | ICD-10-CM | POA: Insufficient documentation

## 2021-09-19 ENCOUNTER — Other Ambulatory Visit: Payer: Self-pay

## 2021-09-19 ENCOUNTER — Ambulatory Visit
Admission: RE | Admit: 2021-09-19 | Discharge: 2021-09-19 | Disposition: A | Discharge status: Still In Hospital | Payer: Medicare PPO | Source: Ambulatory Visit | Attending: Internal Medicine | Admitting: Internal Medicine

## 2021-09-19 DIAGNOSIS — I2510 Atherosclerotic heart disease of native coronary artery without angina pectoris: Secondary | ICD-10-CM | POA: Insufficient documentation

## 2021-09-19 DIAGNOSIS — Z951 Presence of aortocoronary bypass graft: Secondary | ICD-10-CM | POA: Insufficient documentation

## 2021-09-21 ENCOUNTER — Ambulatory Visit
Admission: RE | Admit: 2021-09-21 | Discharge: 2021-09-21 | Disposition: A | Discharge status: Still In Hospital | Payer: Medicare PPO | Source: Ambulatory Visit | Attending: Internal Medicine | Admitting: Internal Medicine

## 2021-09-21 ENCOUNTER — Other Ambulatory Visit: Payer: Self-pay

## 2021-09-21 DIAGNOSIS — I2510 Atherosclerotic heart disease of native coronary artery without angina pectoris: Secondary | ICD-10-CM | POA: Insufficient documentation

## 2021-09-21 DIAGNOSIS — I10 Essential (primary) hypertension: Secondary | ICD-10-CM | POA: Insufficient documentation

## 2021-09-21 DIAGNOSIS — Z951 Presence of aortocoronary bypass graft: Secondary | ICD-10-CM | POA: Insufficient documentation

## 2021-09-23 ENCOUNTER — Ambulatory Visit
Admission: RE | Admit: 2021-09-23 | Discharge: 2021-09-23 | Disposition: A | Discharge status: Still In Hospital | Payer: Medicare PPO | Source: Ambulatory Visit | Attending: Internal Medicine | Admitting: Internal Medicine

## 2021-09-23 ENCOUNTER — Other Ambulatory Visit: Payer: Self-pay

## 2021-09-23 DIAGNOSIS — I2510 Atherosclerotic heart disease of native coronary artery without angina pectoris: Secondary | ICD-10-CM | POA: Insufficient documentation

## 2021-09-23 DIAGNOSIS — Z951 Presence of aortocoronary bypass graft: Secondary | ICD-10-CM | POA: Insufficient documentation

## 2021-09-23 DIAGNOSIS — R011 Cardiac murmur, unspecified: Secondary | ICD-10-CM | POA: Insufficient documentation

## 2021-09-26 ENCOUNTER — Ambulatory Visit
Admission: RE | Admit: 2021-09-26 | Discharge: 2021-09-26 | Disposition: A | Discharge status: Still In Hospital | Payer: Medicare PPO | Source: Ambulatory Visit | Attending: Internal Medicine | Admitting: Internal Medicine

## 2021-09-26 ENCOUNTER — Other Ambulatory Visit: Payer: Self-pay

## 2021-09-26 DIAGNOSIS — I2510 Atherosclerotic heart disease of native coronary artery without angina pectoris: Secondary | ICD-10-CM | POA: Insufficient documentation

## 2021-09-26 DIAGNOSIS — E785 Hyperlipidemia, unspecified: Secondary | ICD-10-CM | POA: Insufficient documentation

## 2021-09-26 DIAGNOSIS — R011 Cardiac murmur, unspecified: Secondary | ICD-10-CM | POA: Insufficient documentation

## 2021-09-26 DIAGNOSIS — Z951 Presence of aortocoronary bypass graft: Secondary | ICD-10-CM | POA: Insufficient documentation

## 2021-09-26 DIAGNOSIS — I10 Essential (primary) hypertension: Secondary | ICD-10-CM | POA: Insufficient documentation

## 2021-09-28 ENCOUNTER — Other Ambulatory Visit: Payer: Self-pay

## 2021-09-28 ENCOUNTER — Ambulatory Visit
Admission: RE | Admit: 2021-09-28 | Discharge: 2021-09-28 | Disposition: A | Discharge status: Still In Hospital | Payer: Medicare PPO | Source: Ambulatory Visit | Attending: Internal Medicine | Admitting: Internal Medicine

## 2021-09-28 DIAGNOSIS — I2510 Atherosclerotic heart disease of native coronary artery without angina pectoris: Secondary | ICD-10-CM | POA: Insufficient documentation

## 2021-09-28 DIAGNOSIS — I10 Essential (primary) hypertension: Secondary | ICD-10-CM | POA: Insufficient documentation

## 2021-09-28 DIAGNOSIS — Z951 Presence of aortocoronary bypass graft: Secondary | ICD-10-CM | POA: Insufficient documentation

## 2021-09-28 DIAGNOSIS — R011 Cardiac murmur, unspecified: Secondary | ICD-10-CM | POA: Insufficient documentation

## 2021-09-28 DIAGNOSIS — E785 Hyperlipidemia, unspecified: Secondary | ICD-10-CM | POA: Insufficient documentation

## 2021-09-30 ENCOUNTER — Other Ambulatory Visit: Payer: Self-pay

## 2021-09-30 ENCOUNTER — Ambulatory Visit
Admission: RE | Admit: 2021-09-30 | Discharge: 2021-09-30 | Disposition: A | Discharge status: Still In Hospital | Payer: Medicare PPO | Source: Ambulatory Visit | Attending: Internal Medicine | Admitting: Internal Medicine

## 2021-09-30 DIAGNOSIS — Z951 Presence of aortocoronary bypass graft: Secondary | ICD-10-CM | POA: Insufficient documentation

## 2021-09-30 DIAGNOSIS — R011 Cardiac murmur, unspecified: Secondary | ICD-10-CM | POA: Insufficient documentation

## 2021-09-30 DIAGNOSIS — I2510 Atherosclerotic heart disease of native coronary artery without angina pectoris: Secondary | ICD-10-CM | POA: Insufficient documentation

## 2021-10-03 ENCOUNTER — Other Ambulatory Visit: Payer: Self-pay

## 2021-10-03 ENCOUNTER — Ambulatory Visit
Admission: RE | Admit: 2021-10-03 | Discharge: 2021-10-03 | Disposition: A | Discharge status: Still In Hospital | Payer: Medicare PPO | Source: Ambulatory Visit | Attending: Internal Medicine | Admitting: Internal Medicine

## 2021-10-03 DIAGNOSIS — I2510 Atherosclerotic heart disease of native coronary artery without angina pectoris: Secondary | ICD-10-CM | POA: Insufficient documentation

## 2021-10-03 DIAGNOSIS — Z951 Presence of aortocoronary bypass graft: Secondary | ICD-10-CM | POA: Insufficient documentation

## 2021-10-03 DIAGNOSIS — E785 Hyperlipidemia, unspecified: Secondary | ICD-10-CM | POA: Insufficient documentation

## 2021-10-03 DIAGNOSIS — R011 Cardiac murmur, unspecified: Secondary | ICD-10-CM | POA: Insufficient documentation

## 2021-10-03 DIAGNOSIS — I10 Essential (primary) hypertension: Secondary | ICD-10-CM | POA: Insufficient documentation

## 2021-10-05 ENCOUNTER — Other Ambulatory Visit: Payer: Self-pay

## 2021-10-05 ENCOUNTER — Ambulatory Visit
Admission: RE | Admit: 2021-10-05 | Discharge: 2021-10-05 | Disposition: A | Discharge status: Still In Hospital | Payer: Medicare PPO | Source: Ambulatory Visit | Attending: Internal Medicine | Admitting: Internal Medicine

## 2021-10-05 DIAGNOSIS — E785 Hyperlipidemia, unspecified: Secondary | ICD-10-CM | POA: Insufficient documentation

## 2021-10-05 DIAGNOSIS — Z951 Presence of aortocoronary bypass graft: Secondary | ICD-10-CM | POA: Insufficient documentation

## 2021-10-05 DIAGNOSIS — I10 Essential (primary) hypertension: Secondary | ICD-10-CM | POA: Insufficient documentation

## 2021-10-05 DIAGNOSIS — R011 Cardiac murmur, unspecified: Secondary | ICD-10-CM | POA: Insufficient documentation

## 2021-10-05 DIAGNOSIS — I2510 Atherosclerotic heart disease of native coronary artery without angina pectoris: Secondary | ICD-10-CM | POA: Insufficient documentation

## 2021-10-07 ENCOUNTER — Ambulatory Visit
Admission: RE | Admit: 2021-10-07 | Discharge: 2021-10-07 | Disposition: A | Discharge status: Still In Hospital | Payer: Medicare PPO | Source: Ambulatory Visit | Attending: Internal Medicine | Admitting: Internal Medicine

## 2021-10-07 ENCOUNTER — Other Ambulatory Visit: Payer: Self-pay

## 2021-10-07 DIAGNOSIS — R011 Cardiac murmur, unspecified: Secondary | ICD-10-CM | POA: Insufficient documentation

## 2021-10-07 DIAGNOSIS — Z951 Presence of aortocoronary bypass graft: Secondary | ICD-10-CM | POA: Insufficient documentation

## 2021-10-07 DIAGNOSIS — E785 Hyperlipidemia, unspecified: Secondary | ICD-10-CM | POA: Insufficient documentation

## 2021-10-10 ENCOUNTER — Encounter (INDEPENDENT_AMBULATORY_CARE_PROVIDER_SITE_OTHER): Payer: Self-pay | Admitting: Family Medicine

## 2021-10-10 ENCOUNTER — Other Ambulatory Visit: Payer: Self-pay

## 2021-10-10 ENCOUNTER — Ambulatory Visit
Admission: RE | Admit: 2021-10-10 | Discharge: 2021-10-10 | Disposition: A | Discharge status: Still In Hospital | Payer: Medicare PPO | Source: Ambulatory Visit | Attending: Internal Medicine | Admitting: Internal Medicine

## 2021-10-10 DIAGNOSIS — R011 Cardiac murmur, unspecified: Secondary | ICD-10-CM | POA: Insufficient documentation

## 2021-10-10 DIAGNOSIS — Z951 Presence of aortocoronary bypass graft: Secondary | ICD-10-CM | POA: Insufficient documentation

## 2021-10-10 DIAGNOSIS — E785 Hyperlipidemia, unspecified: Secondary | ICD-10-CM | POA: Insufficient documentation

## 2021-10-11 ENCOUNTER — Encounter (INDEPENDENT_AMBULATORY_CARE_PROVIDER_SITE_OTHER): Payer: Self-pay | Admitting: Family Medicine

## 2021-10-11 ENCOUNTER — Ambulatory Visit (INDEPENDENT_AMBULATORY_CARE_PROVIDER_SITE_OTHER): Payer: Medicare PPO | Admitting: Family Medicine

## 2021-10-11 VITALS — BP 140/72 | HR 53 | Temp 98.4°F | Ht 67.0 in | Wt 168.0 lb

## 2021-10-11 DIAGNOSIS — Z87891 Personal history of nicotine dependence: Secondary | ICD-10-CM

## 2021-10-11 DIAGNOSIS — K219 Gastro-esophageal reflux disease without esophagitis: Secondary | ICD-10-CM

## 2021-10-11 DIAGNOSIS — N400 Enlarged prostate without lower urinary tract symptoms: Secondary | ICD-10-CM

## 2021-10-11 DIAGNOSIS — I2510 Atherosclerotic heart disease of native coronary artery without angina pectoris: Secondary | ICD-10-CM

## 2021-10-11 DIAGNOSIS — I10 Essential (primary) hypertension: Secondary | ICD-10-CM

## 2021-10-11 DIAGNOSIS — Z951 Presence of aortocoronary bypass graft: Secondary | ICD-10-CM

## 2021-10-11 DIAGNOSIS — G4733 Obstructive sleep apnea (adult) (pediatric): Secondary | ICD-10-CM

## 2021-10-11 DIAGNOSIS — E785 Hyperlipidemia, unspecified: Secondary | ICD-10-CM

## 2021-10-11 NOTE — Progress Notes (Signed)
FAMILY MEDICINE, MEDICAL OFFICE BUILDING  [place] [id number]       Name: Corey [name] MRN:  [id number]   Date: 10/11/2021 Age: 76 y.o.          Provider: Daiva Huge, DO    Reason for visit: Follow Up (CDM)      History of Present Illness:  Corey [name] is a 76 y.o. male here today for chronic disease management.     On February 24,2023 pat had CABG x 4,he is still doing cardiopulm rehab, he has stopped smoking  He followed up with Dr.Kister (interventional cardiologist [facility]) since surgery, but will see Dr.Ward in 2 weeks. He says over all he's doing well, waiting to get cleared to golf.            Historical Data    Past Medical History:  Patient Active Problem List   Diagnosis   . Lumbar disc herniation with radiculopathy   . Fatigue   . Hyperlipidemia   . Heart murmur   . Obstructive sleep apnea   . Right carotid bruit   . GERD (gastroesophageal reflux disease)      Past Surgical History:  Past Surgical History:   Procedure Laterality Date   . HX BACK SURGERY     . HX COLONOSCOPY     . HX CORONARY ARTERY BYPASS GRAFT      x4   . HX HERNIA REPAIR     . HX LAP CHOLECYSTECTOMY           Allergies:  Allergies   Allergen Reactions   . Penicillins  Other Adverse Reaction (Add comment)     Childhood allergy, unknown reaction     Medications:  Current Outpatient Medications   Medication Sig   . amLODIPine (NORVASC) 5 mg Oral Tablet Take 1 Tablet (5 mg total) by mouth Once a day   . aspirin (ECOTRIN) 81 mg Oral Tablet, Delayed Release (E.C.) Take 1 Tablet (81 mg total) by mouth Once a day   . atorvastatin (LIPITOR) 40 mg Oral Tablet Take 1 Tablet (40 mg total) by mouth Once a day   . clopidogreL (PLAVIX) 75 mg Oral Tablet Take 1 Tablet (75 mg total) by mouth Once a day   . cyclobenzaprine (FLEXERIL) 5 mg Oral Tablet Take 1 Tablet (5 mg total) by mouth Three times a day as needed for Muscle spasms   . ergocalciferol, vitamin D2, (DRISDOL) 1,250 mcg (50,000 unit) Oral Capsule Take 1  Capsule (50,000 Units total) by mouth Every 7 days   . famotidine (PEPCID) 40 mg Oral Tablet Take 1 Tablet (40 mg total) by mouth Twice daily   . finasteride (PROSCAR) 5 mg Oral Tablet Take 1 Tablet (5 mg total) by mouth Once a day   . HYDROcodone-acetaminophen (NORCO) 5-325 mg Oral Tablet    . ipratropium-albuteroL (COMBIVENT RESPIMAT) 20-100 mcg/actuation Inhalation Mist Take 1 Puff by inhalation Four times a day   . metoprolol succinate (TOPROL-XL) 25 mg Tb24 Take 0.5 Tablets (12.5 mg total) by mouth Once a day   . nitroGLYCERIN (NITROSTAT) 0.4 mg Sublingual Tablet, Sublingual DISSOLVE 1 TAB UNDER TONGUE EVERY 5 MINUTES AS NEEDED FOR CHEST PAIN. DO NOT EXCEED 3 DOSES IN 15 MINUTES.   . RABEprazole (ACIPHEX) 20 mg Oral Tablet, Delayed Release (E.C.) take 20 mg by mouth Once a day. (Patient not taking: Reported on 10/11/2021)   . sertraline (ZOLOFT) 50 mg Oral Tablet TAKE ONE TABLET BY MOUTH  ONCE EVERY DAY   . tamsulosin (FLOMAX) 0.4 mg Oral Capsule Take 1 Capsule (0.4 mg total) by mouth Once a day   . testosterone cypionate (DEPO-TESTOTERONE) 200 mg/mL IntraMUSCULAR Oil Inject into the muscle     Family History:  Family Medical History:     Problem Relation (Age of Onset)    Brain cancer Father    Hypertension (High Blood Pressure) Mother          Social History:  Social History     Socioeconomic History   . Marital status: Married   Tobacco Use   . Smoking status: Former     Types: Cigarettes     Quit date: 07/2021     Years since quitting: 0.2   Substance and Sexual Activity   . Alcohol use: Never   . Drug use: Never           Review of Systems:  Review of Systems   Constitutional: Negative.  Negative for chills, fever and weight loss.   HENT: Negative.  Negative for congestion, ear pain, hearing loss, sinus pain and sore throat.    Eyes: Negative.  Negative for blurred vision, pain and redness.   Respiratory: Negative.  Negative for cough, shortness of breath and wheezing.    Cardiovascular: Negative.  Negative  for chest pain and palpitations.   Gastrointestinal: Negative.  Negative for abdominal pain, blood in stool, constipation, diarrhea, heartburn, nausea and vomiting.   Genitourinary: Negative.  Negative for flank pain, frequency, hematuria and urgency.   Musculoskeletal: Negative.  Negative for back pain, joint pain and neck pain.   Skin: Negative.  Negative for itching and rash.   Neurological: Negative.  Negative for speech change, seizures, weakness and headaches.   Endo/Heme/Allergies: Negative.    Psychiatric/Behavioral: Negative.         Physical Exam:  Vital Signs:  Vitals:    10/11/21 0932   BP: (!) 140/72   Pulse: 53   Temp: 36.9 C (98.4 F)   TempSrc: Temporal   SpO2: 96%   Weight: 76.2 kg (168 lb)   Height: 1.702 m ('5\' 7"'$ )   BMI: 26.37     Physical Exam  Vitals and nursing note reviewed.   Constitutional:       General: He is not in acute distress.     Appearance: Normal appearance. He is not ill-appearing.   HENT:      Head: Normocephalic and atraumatic.      Right Ear: External ear normal.      Left Ear: External ear normal.      Nose: Nose normal.      Mouth/Throat:      Mouth: Mucous membranes are moist.      Pharynx: Oropharynx is clear.   Eyes:      Pupils: Pupils are equal, round, and reactive to light.   Cardiovascular:      Rate and Rhythm: Normal rate and regular rhythm.      Pulses: Normal pulses.      Heart sounds: No murmur heard.  Pulmonary:      Effort: Pulmonary effort is normal. No respiratory distress.      Breath sounds: Normal breath sounds. No wheezing.   Abdominal:      General: Bowel sounds are normal.      Palpations: Abdomen is soft.      Tenderness: There is no abdominal tenderness.   Musculoskeletal:         General: Normal range of motion.  Cervical back: Normal range of motion and neck supple.   Skin:     General: Skin is warm and dry.      Findings: No lesion or rash.   Neurological:      General: No focal deficit present.      Mental Status: He is alert and oriented to  person, place, and time. Mental status is at baseline.      Gait: Gait normal.   Psychiatric:         Mood and Affect: Mood normal.         Behavior: Behavior normal.         Thought Content: Thought content normal.         Judgment: Judgment normal.          Assessment:    ICD-10-CM    1. Hx of CABG  Z95.1       2. Gastroesophageal reflux disease, unspecified whether esophagitis present  K21.9       3. Obstructive sleep apnea  G47.33       4. History of quadruple bypass  Z95.1       5. Hyperlipidemia, unspecified hyperlipidemia type  E78.5       6. Coronary artery disease, unspecified vessel or lesion type, unspecified whether angina present, unspecified whether native or transplanted heart  I25.10       7. Hypertension, unspecified type  I10       8. Benign prostatic hyperplasia, unspecified whether lower urinary tract symptoms present  N40.0            Plan:      Pt would like to hold off on blood work to see if Dr.Ward orders it  Continue current meds and management.  Return to clinic 4 months  Call with any concerns or questions          Return in about 4 months (around 02/11/2022) for chronic disease management.    Daiva Huge, DO     Portions of this note may be dictated using voice recognition software or a dictation service. Variances in spelling and vocabulary are possible and unintentional. Not all errors are caught/corrected. Please notify the [facility] if any discrepancies are noted or if the meaning of any statement is not clear.

## 2021-10-11 NOTE — Nursing Note (Signed)
10/11/21 0933   Fall Risk Assessment   Do you feel unsteady when standing or walking? No   Do you worry about falling? No   Have you fallen in the past year? Yes   How many times have you fallen? 2 or more times   Were you ever injured from falling? No

## 2021-10-11 NOTE — Nursing Note (Signed)
10/11/21 0933   Depression Screen   Little interest or pleasure in doing things. 0   Feeling down, depressed, or hopeless 0   PHQ 2 Total 0

## 2021-10-12 ENCOUNTER — Ambulatory Visit
Admission: RE | Admit: 2021-10-12 | Discharge: 2021-10-12 | Disposition: A | Discharge status: Still In Hospital | Payer: Medicare PPO | Source: Ambulatory Visit | Attending: Internal Medicine | Admitting: Internal Medicine

## 2021-10-12 ENCOUNTER — Other Ambulatory Visit: Payer: Self-pay

## 2021-10-12 DIAGNOSIS — R011 Cardiac murmur, unspecified: Secondary | ICD-10-CM | POA: Insufficient documentation

## 2021-10-12 DIAGNOSIS — Z951 Presence of aortocoronary bypass graft: Secondary | ICD-10-CM | POA: Insufficient documentation

## 2021-10-14 ENCOUNTER — Ambulatory Visit
Admission: RE | Admit: 2021-10-14 | Discharge: 2021-10-14 | Disposition: A | Discharge status: Still In Hospital | Payer: Medicare PPO | Source: Ambulatory Visit | Attending: Internal Medicine | Admitting: Internal Medicine

## 2021-10-14 ENCOUNTER — Other Ambulatory Visit: Payer: Self-pay

## 2021-10-14 DIAGNOSIS — R011 Cardiac murmur, unspecified: Secondary | ICD-10-CM | POA: Insufficient documentation

## 2021-10-14 DIAGNOSIS — Z951 Presence of aortocoronary bypass graft: Secondary | ICD-10-CM | POA: Insufficient documentation

## 2021-10-14 DIAGNOSIS — E785 Hyperlipidemia, unspecified: Secondary | ICD-10-CM | POA: Insufficient documentation

## 2021-10-17 ENCOUNTER — Ambulatory Visit
Admission: RE | Admit: 2021-10-17 | Discharge: 2021-10-17 | Disposition: A | Discharge status: Still In Hospital | Payer: Medicare PPO | Source: Ambulatory Visit | Attending: Internal Medicine | Admitting: Internal Medicine

## 2021-10-17 ENCOUNTER — Other Ambulatory Visit: Payer: Self-pay

## 2021-10-17 DIAGNOSIS — E785 Hyperlipidemia, unspecified: Secondary | ICD-10-CM | POA: Insufficient documentation

## 2021-10-17 DIAGNOSIS — R011 Cardiac murmur, unspecified: Secondary | ICD-10-CM | POA: Insufficient documentation

## 2021-10-17 DIAGNOSIS — Z951 Presence of aortocoronary bypass graft: Secondary | ICD-10-CM | POA: Insufficient documentation

## 2021-10-18 ENCOUNTER — Other Ambulatory Visit (INDEPENDENT_AMBULATORY_CARE_PROVIDER_SITE_OTHER): Payer: Self-pay | Admitting: NURSE PRACTITIONER

## 2021-10-18 MED ORDER — ATORVASTATIN 40 MG TABLET
40.0000 mg | ORAL_TABLET | Freq: Every day | ORAL | 3 refills | Status: DC
Start: 2021-10-18 — End: 2022-09-14

## 2021-10-19 ENCOUNTER — Ambulatory Visit
Admission: RE | Admit: 2021-10-19 | Discharge: 2021-10-19 | Disposition: A | Discharge status: Still In Hospital | Payer: Medicare PPO | Source: Ambulatory Visit | Attending: Internal Medicine | Admitting: Internal Medicine

## 2021-10-19 ENCOUNTER — Other Ambulatory Visit: Payer: Self-pay

## 2021-10-19 DIAGNOSIS — E785 Hyperlipidemia, unspecified: Secondary | ICD-10-CM | POA: Insufficient documentation

## 2021-10-19 DIAGNOSIS — Z951 Presence of aortocoronary bypass graft: Secondary | ICD-10-CM | POA: Insufficient documentation

## 2021-10-19 DIAGNOSIS — I2510 Atherosclerotic heart disease of native coronary artery without angina pectoris: Secondary | ICD-10-CM | POA: Insufficient documentation

## 2021-10-21 ENCOUNTER — Other Ambulatory Visit: Payer: Self-pay

## 2021-10-21 ENCOUNTER — Ambulatory Visit
Admission: RE | Admit: 2021-10-21 | Discharge: 2021-10-21 | Disposition: A | Discharge status: Still In Hospital | Payer: Medicare PPO | Source: Ambulatory Visit | Attending: Internal Medicine | Admitting: Internal Medicine

## 2021-10-21 DIAGNOSIS — E785 Hyperlipidemia, unspecified: Secondary | ICD-10-CM | POA: Insufficient documentation

## 2021-10-21 DIAGNOSIS — R011 Cardiac murmur, unspecified: Secondary | ICD-10-CM | POA: Insufficient documentation

## 2021-10-21 DIAGNOSIS — Z951 Presence of aortocoronary bypass graft: Secondary | ICD-10-CM | POA: Insufficient documentation

## 2021-10-22 ENCOUNTER — Encounter (INDEPENDENT_AMBULATORY_CARE_PROVIDER_SITE_OTHER): Payer: Self-pay | Admitting: INTERVENTIONAL CARDIOLOGY

## 2021-10-24 ENCOUNTER — Ambulatory Visit
Admission: RE | Admit: 2021-10-24 | Discharge: 2021-10-24 | Disposition: A | Discharge status: Still In Hospital | Payer: Medicare PPO | Source: Ambulatory Visit | Attending: Internal Medicine | Admitting: Internal Medicine

## 2021-10-24 ENCOUNTER — Other Ambulatory Visit: Payer: Self-pay

## 2021-10-24 DIAGNOSIS — Z951 Presence of aortocoronary bypass graft: Secondary | ICD-10-CM | POA: Insufficient documentation

## 2021-10-24 DIAGNOSIS — R011 Cardiac murmur, unspecified: Secondary | ICD-10-CM | POA: Insufficient documentation

## 2021-10-25 ENCOUNTER — Ambulatory Visit (INDEPENDENT_AMBULATORY_CARE_PROVIDER_SITE_OTHER): Payer: Medicare PPO | Admitting: NURSE PRACTITIONER

## 2021-10-25 ENCOUNTER — Encounter (INDEPENDENT_AMBULATORY_CARE_PROVIDER_SITE_OTHER): Payer: Self-pay | Admitting: INTERVENTIONAL CARDIOLOGY

## 2021-10-25 VITALS — BP 139/70 | HR 67 | Ht 66.0 in | Wt 167.1 lb

## 2021-10-25 DIAGNOSIS — I2510 Atherosclerotic heart disease of native coronary artery without angina pectoris: Secondary | ICD-10-CM

## 2021-10-25 DIAGNOSIS — E785 Hyperlipidemia, unspecified: Secondary | ICD-10-CM

## 2021-10-25 DIAGNOSIS — G4733 Obstructive sleep apnea (adult) (pediatric): Secondary | ICD-10-CM

## 2021-10-25 DIAGNOSIS — R011 Cardiac murmur, unspecified: Secondary | ICD-10-CM

## 2021-10-25 DIAGNOSIS — I10 Essential (primary) hypertension: Secondary | ICD-10-CM

## 2021-10-25 DIAGNOSIS — Z9989 Dependence on other enabling machines and devices: Secondary | ICD-10-CM

## 2021-10-25 NOTE — H&P (Signed)
Cardiology [facility]    Name: Corey [name]  Age: 76 y.o.  Date of Service: 10/25/2021    Primary Care Provider: Daiva Huge, DO  Chief Complaint:   Chief Complaint   Patient presents with   . Follow-up After Testing     Heart Cath by Dr. Leonides Schanz, sent to [facility] for CABG.   . Heart Disease       HPI:  Corey [name] is a very pleasant 77 y.o. male with a past medical history significant for CAD.  He is a patient of Dr. Marcille Blanco.  He was sent for left heart catheterization in February 2023 with Dr. Leonides Schanz.  This showed severe complex native vessel disease and he was sent to Dr. Cherylynn Ridges for bypass evaluation.  Patient received 4 vessel bypass with LIMA to the LAD, vein graft to OM1 and OM2, and vein graft to the RCA.  EF was preserved 60-65%. Mild aortic stenosis. Patient also has a history of obstructive sleep apnea and wears CPAP.      10/25/21 The patient is here for follow-up from recent bypass surgery.  He reports he has done well since then.  He denies any significant chest pains or shortness of breath, but his prior symptoms are more dizziness and just a funny feeling in his chest.  He is an avid golfer and had some issues while golfing.  He actually went golfing the other day and did really well.  He did see Dr. Cherylynn Ridges once since his surgery and was released.  He does not have a follow-up with Dr. Marcille Blanco yet.    Past Medical History:  Past Medical History:   Diagnosis Date   . Anxiety    . Arteriosclerotic vascular disease    . BPH (benign prostatic hyperplasia)    . Coronary artery disease    . Depression    . Esophageal reflux    . 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    [facility]  Dr Cherylynn Ridges   . Hypercholesterolemia    . Hypertension    . IBS (irritable bowel syndrome)    . Leukocytosis    . Narcolepsy    . OSA (obstructive sleep apnea)    . Osteoarthritis    . Right carotid bruit    . Thyroid nodule          Social History:  Social History     Tobacco Use   Smoking Status Some  Days   . Types: Cigarettes   Smokeless Tobacco Never      Social History     Substance and Sexual Activity   Alcohol Use None      Social History     Substance and Sexual Activity   Drug Use Never      Current Medications:  Current Outpatient Medications   Medication Sig   . amLODIPine (NORVASC) 5 mg Oral Tablet Take 1 Tablet (5 mg total) by mouth Once a day   . aspirin (ECOTRIN) 81 mg Oral Tablet, Delayed Release (E.C.) Take 1 Tablet (81 mg total) by mouth Once a day   . atorvastatin (LIPITOR) 40 mg Oral Tablet Take 1 Tablet (40 mg total) by mouth Once a day   . clopidogreL (PLAVIX) 75 mg Oral Tablet Take 1 Tablet (75 mg total) by mouth Once a day   . cyclobenzaprine (FLEXERIL) 5 mg Oral Tablet Take 1 Tablet (5 mg total) by mouth Three times a day as needed for Muscle spasms   .  ergocalciferol, vitamin D2, (DRISDOL) 1,250 mcg (50,000 unit) Oral Capsule Take 1 Capsule (50,000 Units total) by mouth Every 7 days   . famotidine (PEPCID) 40 mg Oral Tablet Take 1 Tablet (40 mg total) by mouth Twice daily (Patient taking differently: Take 1 Tablet (40 mg total) by mouth Once per day as needed)   . finasteride (PROSCAR) 5 mg Oral Tablet Take 1 Tablet (5 mg total) by mouth Once a day   . HYDROcodone-acetaminophen (NORCO) 5-325 mg Oral Tablet Take 1 Tablet by mouth Every 4 hours as needed   . ipratropium-albuteroL (COMBIVENT RESPIMAT) 20-100 mcg/actuation Inhalation Mist Take 1 Puff by inhalation Four times a day   . metoprolol succinate (TOPROL-XL) 25 mg Tb24 Take 0.5 Tablets (12.5 mg total) by mouth Once a day   . nitroGLYCERIN (NITROSTAT) 0.4 mg Sublingual Tablet, Sublingual DISSOLVE 1 TAB UNDER TONGUE EVERY 5 MINUTES AS NEEDED FOR CHEST PAIN. DO NOT EXCEED 3 DOSES IN 15 MINUTES.   . RABEprazole (ACIPHEX) 20 mg Oral Tablet, Delayed Release (E.C.) take 20 mg by mouth Once a day. (Patient not taking: Reported on 10/11/2021)   . sertraline (ZOLOFT) 50 mg Oral Tablet TAKE ONE TABLET BY MOUTH ONCE EVERY DAY   . tamsulosin  (FLOMAX) 0.4 mg Oral Capsule Take 1 Capsule (0.4 mg total) by mouth Once a day   . testosterone cypionate (DEPO-TESTOTERONE) 200 mg/mL IntraMUSCULAR Oil Inject into the muscle     Allergies:  Allergies   Allergen Reactions   . Penicillins  Other Adverse Reaction (Add comment)     Childhood allergy, unknown reaction      Review of Systems:  Complete ROS was performed and otherwise negative unless noted in HPI.      Vital Signs:  Vitals:    10/25/21 1335   BP: 139/70   Pulse: 67   SpO2: 96%   Weight: 75.8 kg (167 lb 2 oz)   Height: 1.676 m ('5\' 6"'$ )   BMI: 27.03      Physical Exam:  General: Pt resting comfortably in no acute distress and appears stated age.    Neck: No JVD, no carotid bruit. Neck supple, symmetrical, trachea midline.   Lungs:  Normal respiratory effort, lungs clear to auscultation bilaterally.    Cardiovascular: Regular rate and rhythm. Normal S1 S2. Grade 2/6 systolic murmur. No rub. No gallop. No thrill.   Abdomen: Soft, non-tender and bowel sounds normal.    Extremities: Extremities normal, atraumatic, no cyanosis or edema.    Neurologic: Alert and oriented x3.       Assessment:     CAD in native artery    Essential hypertension    Hyperlipidemia, unspecified hyperlipidemia type    OSA on CPAP    Murmur, cardiac     Plan:  Patient has done well since bypass.  He mentioned about his murmur, but his echo after bypass surgery just showed mild aortic stenosis.  Prior echo in April of 2021 also showed mild aortic stenosis.  There was no major gradient on heart catheterization.  Continue following up with Dr. Marcille Blanco.  He does not need to follow with our office.       Orders placed this visit:  Orders Placed This Encounter   . EKG (In-Clinic Today)       [name] is to return to clinic for follow up with the understanding that should symptoms change or worsen he is to call the office or go to the closest emergency department for evaluation.  [name], APRN,FNP-BC    A portion of this  documentation may have been generated using [employer] voice recognition software and may contain syntax/voice recognition errors.

## 2021-10-26 ENCOUNTER — Other Ambulatory Visit: Payer: Self-pay

## 2021-10-26 ENCOUNTER — Ambulatory Visit
Admission: RE | Admit: 2021-10-26 | Discharge: 2021-10-26 | Disposition: A | Discharge status: Still In Hospital | Payer: Medicare PPO | Source: Ambulatory Visit | Attending: Internal Medicine | Admitting: Internal Medicine

## 2021-10-26 DIAGNOSIS — I2510 Atherosclerotic heart disease of native coronary artery without angina pectoris: Secondary | ICD-10-CM | POA: Insufficient documentation

## 2021-10-26 DIAGNOSIS — R011 Cardiac murmur, unspecified: Secondary | ICD-10-CM | POA: Insufficient documentation

## 2021-10-26 DIAGNOSIS — Z951 Presence of aortocoronary bypass graft: Secondary | ICD-10-CM | POA: Insufficient documentation

## 2021-10-28 ENCOUNTER — Other Ambulatory Visit: Payer: Self-pay

## 2021-10-28 ENCOUNTER — Ambulatory Visit
Admission: RE | Admit: 2021-10-28 | Discharge: 2021-10-28 | Disposition: A | Discharge status: Still In Hospital | Payer: Medicare PPO | Source: Ambulatory Visit | Attending: Internal Medicine | Admitting: Internal Medicine

## 2021-10-28 DIAGNOSIS — I10 Essential (primary) hypertension: Secondary | ICD-10-CM | POA: Insufficient documentation

## 2021-10-28 DIAGNOSIS — Z951 Presence of aortocoronary bypass graft: Secondary | ICD-10-CM | POA: Insufficient documentation

## 2021-10-31 ENCOUNTER — Other Ambulatory Visit: Payer: Medicare PPO | Attending: Family Medicine | Admitting: Family Medicine

## 2021-10-31 ENCOUNTER — Ambulatory Visit
Admission: RE | Admit: 2021-10-31 | Discharge: 2021-10-31 | Disposition: A | Discharge status: Still In Hospital | Payer: Medicare PPO | Source: Ambulatory Visit | Attending: Internal Medicine | Admitting: Internal Medicine

## 2021-10-31 ENCOUNTER — Other Ambulatory Visit: Payer: Self-pay

## 2021-10-31 ENCOUNTER — Ambulatory Visit (INDEPENDENT_AMBULATORY_CARE_PROVIDER_SITE_OTHER): Payer: Medicare PPO

## 2021-10-31 DIAGNOSIS — E538 Deficiency of other specified B group vitamins: Secondary | ICD-10-CM

## 2021-10-31 DIAGNOSIS — E785 Hyperlipidemia, unspecified: Secondary | ICD-10-CM | POA: Insufficient documentation

## 2021-10-31 DIAGNOSIS — E559 Vitamin D deficiency, unspecified: Secondary | ICD-10-CM | POA: Insufficient documentation

## 2021-10-31 DIAGNOSIS — I10 Essential (primary) hypertension: Secondary | ICD-10-CM | POA: Insufficient documentation

## 2021-10-31 DIAGNOSIS — Z951 Presence of aortocoronary bypass graft: Secondary | ICD-10-CM | POA: Insufficient documentation

## 2021-10-31 LAB — COMPREHENSIVE METABOLIC PNL, FASTING
ALBUMIN/GLOBULIN RATIO: 1.6 — ABNORMAL HIGH (ref 0.8–1.4)
ALBUMIN: 4.6 g/dL (ref 3.5–5.7)
ALKALINE PHOSPHATASE: 90 U/L (ref 34–104)
ALT (SGPT): 14 U/L (ref 7–52)
ANION GAP: 7 mmol/L — ABNORMAL LOW (ref 10–20)
AST (SGOT): 15 U/L (ref 13–39)
BILIRUBIN TOTAL: 0.5 mg/dL (ref 0.3–1.2)
BUN/CREA RATIO: 17 (ref 6–22)
BUN: 18 mg/dL (ref 7–25)
CALCIUM, CORRECTED: 9 mg/dL (ref 8.9–10.8)
CALCIUM: 9.6 mg/dL (ref 8.6–10.3)
CHLORIDE: 107 mmol/L (ref 98–107)
CO2 TOTAL: 28 mmol/L (ref 21–31)
CREATININE: 1.04 mg/dL (ref 0.60–1.30)
ESTIMATED GFR: 75 mL/min/{1.73_m2} (ref >59)
GLOBULIN: 2.8 — ABNORMAL LOW (ref 2.9–5.4)
GLUCOSE: 96 mg/dL (ref 74–109)
OSMOLALITY, CALCULATED: 285 mOsm/kg (ref 270–290)
POTASSIUM: 4 mmol/L (ref 3.5–5.1)
PROTEIN TOTAL: 7.4 g/dL (ref 6.4–8.9)
SODIUM: 142 mmol/L (ref 136–145)

## 2021-10-31 LAB — VITAMIN B12: VITAMIN B 12: 261 pg/mL (ref 180–914)

## 2021-10-31 LAB — LIPID PANEL
CHOL/HDL RATIO: 5.8
CHOLESTEROL: 133 mg/dL (ref <200)
HDL CHOL: 23 mg/dL (ref 23–92)
LDL CALC: 57 mg/dL (ref 0–100)
TRIGLYCERIDES: 264 mg/dL — ABNORMAL HIGH (ref <=150)
VLDL CALC: 53 mg/dL — ABNORMAL HIGH (ref 0–50)

## 2021-10-31 LAB — VITAMIN D 25 TOTAL: VITAMIN D: 60 ng/mL (ref 30–100)

## 2021-11-02 ENCOUNTER — Ambulatory Visit
Admission: RE | Admit: 2021-11-02 | Discharge: 2021-11-02 | Disposition: A | Discharge status: Still In Hospital | Payer: Medicare PPO | Source: Ambulatory Visit | Attending: Internal Medicine | Admitting: Internal Medicine

## 2021-11-02 ENCOUNTER — Encounter (INDEPENDENT_AMBULATORY_CARE_PROVIDER_SITE_OTHER): Payer: Self-pay | Admitting: Family Medicine

## 2021-11-02 ENCOUNTER — Other Ambulatory Visit: Payer: Self-pay

## 2021-11-02 DIAGNOSIS — I10 Essential (primary) hypertension: Secondary | ICD-10-CM | POA: Insufficient documentation

## 2021-11-02 DIAGNOSIS — Z951 Presence of aortocoronary bypass graft: Secondary | ICD-10-CM | POA: Insufficient documentation

## 2021-11-04 ENCOUNTER — Other Ambulatory Visit: Payer: Self-pay

## 2021-11-04 ENCOUNTER — Ambulatory Visit
Admission: RE | Admit: 2021-11-04 | Discharge: 2021-11-04 | Disposition: A | Discharge status: Still In Hospital | Payer: Medicare PPO | Source: Ambulatory Visit | Attending: Internal Medicine | Admitting: Internal Medicine

## 2021-11-04 DIAGNOSIS — Z951 Presence of aortocoronary bypass graft: Secondary | ICD-10-CM | POA: Insufficient documentation

## 2021-11-04 DIAGNOSIS — R011 Cardiac murmur, unspecified: Secondary | ICD-10-CM | POA: Insufficient documentation

## 2021-11-07 ENCOUNTER — Ambulatory Visit (HOSPITAL_COMMUNITY): Payer: Self-pay

## 2021-11-09 ENCOUNTER — Other Ambulatory Visit: Payer: Self-pay

## 2021-11-09 ENCOUNTER — Ambulatory Visit
Admission: RE | Admit: 2021-11-09 | Discharge: 2021-11-09 | Disposition: A | Discharge status: Still In Hospital | Payer: Medicare PPO | Source: Ambulatory Visit | Attending: Internal Medicine | Admitting: Internal Medicine

## 2021-11-09 DIAGNOSIS — I10 Essential (primary) hypertension: Secondary | ICD-10-CM | POA: Insufficient documentation

## 2021-11-09 DIAGNOSIS — Z951 Presence of aortocoronary bypass graft: Secondary | ICD-10-CM | POA: Insufficient documentation

## 2021-11-10 ENCOUNTER — Other Ambulatory Visit (INDEPENDENT_AMBULATORY_CARE_PROVIDER_SITE_OTHER): Payer: Self-pay | Admitting: Family Medicine

## 2021-11-10 MED ORDER — AMLODIPINE 5 MG TABLET
5.0000 mg | ORAL_TABLET | Freq: Every day | ORAL | 1 refills | Status: DC
Start: 2021-11-10 — End: 2022-06-01

## 2021-11-10 NOTE — Telephone Encounter (Signed)
Wife called and said it's been a while since he has had any but needs them now, he will use sparingly.

## 2021-11-11 ENCOUNTER — Other Ambulatory Visit: Payer: Self-pay

## 2021-11-11 ENCOUNTER — Ambulatory Visit
Admission: RE | Admit: 2021-11-11 | Discharge: 2021-11-11 | Disposition: A | Discharge status: Still In Hospital | Payer: Medicare PPO | Source: Ambulatory Visit | Attending: Internal Medicine | Admitting: Internal Medicine

## 2021-11-11 DIAGNOSIS — R011 Cardiac murmur, unspecified: Secondary | ICD-10-CM | POA: Insufficient documentation

## 2021-11-11 DIAGNOSIS — E785 Hyperlipidemia, unspecified: Secondary | ICD-10-CM | POA: Insufficient documentation

## 2021-11-11 DIAGNOSIS — I252 Old myocardial infarction: Secondary | ICD-10-CM | POA: Insufficient documentation

## 2021-11-15 MED ORDER — TRAMADOL 50 MG TABLET
1.0000 | ORAL_TABLET | Freq: Two times a day (BID) | ORAL | 0 refills | Status: DC | PRN
Start: 2021-11-15 — End: 2022-01-08

## 2021-11-16 ENCOUNTER — Other Ambulatory Visit: Payer: Self-pay

## 2021-12-21 ENCOUNTER — Encounter (INDEPENDENT_AMBULATORY_CARE_PROVIDER_SITE_OTHER): Payer: Self-pay

## 2021-12-21 DIAGNOSIS — F17200 Nicotine dependence, unspecified, uncomplicated: Secondary | ICD-10-CM | POA: Insufficient documentation

## 2021-12-21 DIAGNOSIS — C4490 Unspecified malignant neoplasm of skin, unspecified: Secondary | ICD-10-CM | POA: Insufficient documentation

## 2021-12-21 DIAGNOSIS — I10 Essential (primary) hypertension: Secondary | ICD-10-CM | POA: Insufficient documentation

## 2021-12-21 DIAGNOSIS — N2889 Other specified disorders of kidney and ureter: Secondary | ICD-10-CM | POA: Insufficient documentation

## 2021-12-21 DIAGNOSIS — N138 Other obstructive and reflux uropathy: Secondary | ICD-10-CM | POA: Insufficient documentation

## 2021-12-21 DIAGNOSIS — N059 Unspecified nephritic syndrome with unspecified morphologic changes: Secondary | ICD-10-CM

## 2021-12-21 DIAGNOSIS — E041 Nontoxic single thyroid nodule: Secondary | ICD-10-CM | POA: Insufficient documentation

## 2021-12-21 DIAGNOSIS — N400 Enlarged prostate without lower urinary tract symptoms: Secondary | ICD-10-CM | POA: Insufficient documentation

## 2021-12-21 DIAGNOSIS — M79672 Pain in left foot: Secondary | ICD-10-CM

## 2021-12-21 DIAGNOSIS — M549 Dorsalgia, unspecified: Secondary | ICD-10-CM | POA: Insufficient documentation

## 2021-12-21 DIAGNOSIS — R059 Cough, unspecified: Secondary | ICD-10-CM | POA: Insufficient documentation

## 2021-12-21 DIAGNOSIS — E559 Vitamin D deficiency, unspecified: Secondary | ICD-10-CM | POA: Insufficient documentation

## 2021-12-21 DIAGNOSIS — M1990 Unspecified osteoarthritis, unspecified site: Secondary | ICD-10-CM | POA: Insufficient documentation

## 2021-12-21 DIAGNOSIS — G47419 Narcolepsy without cataplexy: Secondary | ICD-10-CM | POA: Insufficient documentation

## 2021-12-21 DIAGNOSIS — R7989 Other specified abnormal findings of blood chemistry: Secondary | ICD-10-CM

## 2021-12-21 DIAGNOSIS — R202 Paresthesia of skin: Secondary | ICD-10-CM

## 2021-12-21 DIAGNOSIS — R634 Abnormal weight loss: Secondary | ICD-10-CM | POA: Insufficient documentation

## 2021-12-21 DIAGNOSIS — Z9889 Other specified postprocedural states: Secondary | ICD-10-CM

## 2021-12-21 DIAGNOSIS — M25551 Pain in right hip: Secondary | ICD-10-CM

## 2021-12-21 DIAGNOSIS — M545 Low back pain, unspecified: Secondary | ICD-10-CM | POA: Insufficient documentation

## 2021-12-21 DIAGNOSIS — F17210 Nicotine dependence, cigarettes, uncomplicated: Secondary | ICD-10-CM | POA: Insufficient documentation

## 2021-12-21 DIAGNOSIS — M542 Cervicalgia: Secondary | ICD-10-CM

## 2021-12-21 DIAGNOSIS — D72829 Elevated white blood cell count, unspecified: Secondary | ICD-10-CM | POA: Insufficient documentation

## 2021-12-21 DIAGNOSIS — F419 Anxiety disorder, unspecified: Secondary | ICD-10-CM | POA: Insufficient documentation

## 2021-12-21 DIAGNOSIS — I7090 Unspecified atherosclerosis: Secondary | ICD-10-CM | POA: Insufficient documentation

## 2021-12-21 DIAGNOSIS — M79659 Pain in unspecified thigh: Secondary | ICD-10-CM

## 2021-12-21 DIAGNOSIS — R748 Abnormal levels of other serum enzymes: Secondary | ICD-10-CM | POA: Insufficient documentation

## 2021-12-21 HISTORY — DX: Cervicalgia: M54.2

## 2021-12-21 HISTORY — DX: Pain in right hip: M25.551

## 2021-12-21 HISTORY — DX: Unspecified nephritic syndrome with unspecified morphologic changes: N05.9

## 2021-12-21 HISTORY — DX: Paresthesia of skin: R20.2

## 2021-12-21 HISTORY — DX: Pain in unspecified thigh: M79.659

## 2021-12-21 HISTORY — DX: Pain in left foot: M79.672

## 2021-12-21 HISTORY — DX: Other specified abnormal findings of blood chemistry: R79.89

## 2021-12-21 HISTORY — DX: Abnormal weight loss: R63.4

## 2021-12-21 HISTORY — DX: Other specified postprocedural states: Z98.890

## 2021-12-26 ENCOUNTER — Other Ambulatory Visit (INDEPENDENT_AMBULATORY_CARE_PROVIDER_SITE_OTHER): Payer: Self-pay | Admitting: Family Medicine

## 2021-12-26 NOTE — Telephone Encounter (Signed)
Received a fax request from pharmacy for this refill, I can't find that you have ever sent it.

## 2021-12-27 MED ORDER — TESTOSTERONE CYPIONATE 200 MG/ML INTRAMUSCULAR OIL
200.0000 mg | TOPICAL_OIL | INTRAMUSCULAR | 1 refills | Status: DC
Start: 2021-12-27 — End: 2022-02-14

## 2021-12-27 NOTE — Telephone Encounter (Signed)
I called pharmacy and they said the rx was written by you in Oct and he only had it filled one time. I spoke with patients wife and she said that his back hurts so bad most of the time he doesn't want the shots but she is trying to get him back on track. I asked her how many injections he has taken, she said one in Oct and two in the last couple of weeks (total of 3 since rx in Oct.) This was supposed to be weekly. I told her I didn't know if you would refill this since he has missed so many doses or if we would need to have labs first.

## 2022-01-08 ENCOUNTER — Other Ambulatory Visit (INDEPENDENT_AMBULATORY_CARE_PROVIDER_SITE_OTHER): Payer: Self-pay | Admitting: Family Medicine

## 2022-01-10 MED ORDER — TRAMADOL 50 MG TABLET
1.0000 | ORAL_TABLET | Freq: Two times a day (BID) | ORAL | 0 refills | Status: DC | PRN
Start: 2022-01-10 — End: 2022-02-14

## 2022-01-24 ENCOUNTER — Other Ambulatory Visit (INDEPENDENT_AMBULATORY_CARE_PROVIDER_SITE_OTHER): Payer: Self-pay | Admitting: NURSE PRACTITIONER

## 2022-01-24 ENCOUNTER — Other Ambulatory Visit (INDEPENDENT_AMBULATORY_CARE_PROVIDER_SITE_OTHER): Payer: Self-pay | Admitting: Family Medicine

## 2022-01-24 MED ORDER — METOPROLOL SUCCINATE ER 25 MG TABLET,EXTENDED RELEASE 24 HR
25.0000 mg | ORAL_TABLET | Freq: Every day | ORAL | 6 refills | Status: DC
Start: 2022-01-24 — End: 2022-01-24

## 2022-01-24 MED ORDER — METOPROLOL SUCCINATE ER 25 MG TABLET,EXTENDED RELEASE 24 HR
25.0000 mg | ORAL_TABLET | Freq: Every day | ORAL | 6 refills | Status: DC
Start: 2022-01-24 — End: 2022-09-19

## 2022-02-02 ENCOUNTER — Other Ambulatory Visit (INDEPENDENT_AMBULATORY_CARE_PROVIDER_SITE_OTHER): Payer: Self-pay | Admitting: Family Medicine

## 2022-02-02 MED ORDER — ERGOCALCIFEROL (VITAMIN D2) 1,250 MCG (50,000 UNIT) CAPSULE
50000.0000 [IU] | ORAL_CAPSULE | ORAL | 1 refills | Status: DC
Start: 2022-02-02 — End: 2022-06-15

## 2022-02-14 ENCOUNTER — Ambulatory Visit (INDEPENDENT_AMBULATORY_CARE_PROVIDER_SITE_OTHER): Payer: Medicare PPO | Admitting: Family Medicine

## 2022-02-14 ENCOUNTER — Other Ambulatory Visit: Payer: Self-pay

## 2022-02-14 ENCOUNTER — Encounter (INDEPENDENT_AMBULATORY_CARE_PROVIDER_SITE_OTHER): Payer: Self-pay | Admitting: Family Medicine

## 2022-02-14 VITALS — BP 141/81 | HR 72 | Temp 97.7°F | Ht 66.0 in | Wt 158.0 lb

## 2022-02-14 DIAGNOSIS — E785 Hyperlipidemia, unspecified: Secondary | ICD-10-CM

## 2022-02-14 DIAGNOSIS — I10 Essential (primary) hypertension: Secondary | ICD-10-CM

## 2022-02-14 DIAGNOSIS — Z1211 Encounter for screening for malignant neoplasm of colon: Secondary | ICD-10-CM

## 2022-02-14 DIAGNOSIS — E559 Vitamin D deficiency, unspecified: Secondary | ICD-10-CM

## 2022-02-14 DIAGNOSIS — R350 Frequency of micturition: Secondary | ICD-10-CM

## 2022-02-14 MED ORDER — TRAMADOL 50 MG TABLET
1.0000 | ORAL_TABLET | Freq: Two times a day (BID) | ORAL | 5 refills | Status: AC | PRN
Start: 2022-02-14 — End: 2022-08-13

## 2022-02-14 NOTE — Progress Notes (Signed)
FAMILY MEDICINE, MEDICAL OFFICE BUILDING  [place] [id number]       Name: Corey [name] MRN:  [id number]   Date: 02/14/2022 Age: 76 y.o.          Provider: Daiva Huge, DO    Reason for visit: Follow Up 4 Months      History of Present Illness:  Corey [name] is a 76 y.o. male here today for chronic disease management.       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 He has followed with Dr.Ward, he is aware of the risk of smoking.      He's been using tramadol on occasion for years, this helps with his chronic back pain.    He does mention he's having to get up to urinate 3-4 times a night, he maybe saw a urologist many years ago .    Last colonoscopy/cologuard: declined colonoscopy, mailed cologuard      Historical Data    Past Medical History:  Patient Active Problem List   Diagnosis    Lumbar disc herniation with radiculopathy    Fatigue    Hyperlipidemia    Heart murmur    Obstructive sleep apnea    Right carotid bruit    GERD (gastroesophageal reflux disease)    Right hip pain    Left foot pain    Neck pain    Skin cancer    Thigh pain    Elevated alkaline phosphatase level    Low testosterone in male    Weight loss, non-intentional    Cough    Previous back surgery    Chronic back pain    Paresthesia    Arteriosclerotic vascular disease    Narcolepsy    Essential hypertension    Anxiety    Osteoarthritis    Vitamin D deficiency    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    Renal mass    Cigarette smoker    Leukocytosis    Thyroid nodule      Past Surgical History:  Past Surgical History:   Procedure Laterality Date    CARDIAC CATHETERIZATION  07/13/2021    [id number]    Dr Annie Main [facility]    DENTAL SURGERY      HX BACK SURGERY      HX COLONOSCOPY      HX CORONARY ARTERY BYPASS GRAFT  08/03/2021    Dr Cherylynn Ridges [place]    HX HERNIA REPAIR      HX LAP CHOLECYSTECTOMY      ORTHOPEDIC  SURGERY           Allergies:  Allergies   Allergen Reactions    Penicillins  Other Adverse Reaction (Add comment)     Childhood allergy, unknown reaction     Medications:  Current Outpatient Medications   Medication Sig    amLODIPine (NORVASC) 5 mg Oral Tablet Take 1 Tablet (5 mg total) by mouth Once a day    aspirin (ECOTRIN) 81 mg Oral Tablet, Delayed Release (E.C.) Take 1 Tablet (81 mg total) by mouth Once a day    atorvastatin (LIPITOR) 40 mg Oral Tablet Take 1 Tablet (40 mg total) by mouth Once a day    clopidogreL (PLAVIX) 75 mg Oral Tablet Take 1 Tablet (75 mg total) by mouth Once a day    ergocalciferol, vitamin D2, (DRISDOL) 1,250 mcg (50,000 unit) Oral Capsule  Take 1 Capsule (50,000 Units total) by mouth Every 7 days    finasteride (PROSCAR) 5 mg Oral Tablet Take 1 Tablet (5 mg total) by mouth Once a day    HYDROcodone-acetaminophen (NORCO) 5-325 mg Oral Tablet Take 1 Tablet by mouth Every 4 hours as needed    ipratropium-albuteroL (COMBIVENT RESPIMAT) 20-100 mcg/actuation Inhalation Mist Take 1 Puff by inhalation Four times a day    metoprolol succinate (TOPROL-XL) 25 mg Oral Tablet Sustained Release 24 hr Take 1 Tablet (25 mg total) by mouth Once a day    nitroGLYCERIN (NITROSTAT) 0.4 mg Sublingual Tablet, Sublingual DISSOLVE 1 TAB UNDER TONGUE EVERY 5 MINUTES AS NEEDED FOR CHEST PAIN. DO NOT EXCEED 3 DOSES IN 15 MINUTES.    sertraline (ZOLOFT) 50 mg Oral Tablet TAKE ONE TABLET BY MOUTH ONCE EVERY DAY    tamsulosin (FLOMAX) 0.4 mg Oral Capsule Take 1 Capsule (0.4 mg total) by mouth Once a day    traMADoL (ULTRAM) 50 mg Oral Tablet Take 1 Tablet (50 mg total) by mouth Twice per day as needed for Pain for up to 180 days     Family History:  Family Medical History:       Problem Relation (Age of Onset)    Brain cancer Father    Heart Disease Mother    Hypertension (High Blood Pressure) Mother            Social History:  Social History     Socioeconomic History    Marital status: Married   Tobacco Use     Smoking status: Some Days     Types: Cigarettes    Smokeless tobacco: Never   Vaping Use    Vaping Use: Never used   Substance and Sexual Activity    Drug use: Never           Review of Systems:  Review of Systems   Constitutional: Negative.  Negative for chills, fever and weight loss.   HENT: Negative.  Negative for congestion, ear pain, hearing loss, sinus pain and sore throat.    Eyes: Negative.  Negative for blurred vision, pain and redness.   Respiratory: Negative.  Negative for cough, shortness of breath and wheezing.    Cardiovascular: Negative.  Negative for chest pain and palpitations.   Gastrointestinal: Negative.  Negative for abdominal pain, blood in stool, constipation, diarrhea, heartburn, nausea and vomiting.   Genitourinary: Negative.  Negative for flank pain, frequency, hematuria and urgency.   Musculoskeletal: Negative.  Negative for back pain, joint pain and neck pain.   Skin: Negative.  Negative for itching and rash.   Neurological: Negative.  Negative for speech change, seizures, weakness and headaches.   Endo/Heme/Allergies: Negative.    Psychiatric/Behavioral: Negative.        Physical Exam:  Vital Signs:  Vitals:    02/14/22 0810   BP: (!) 141/81   Pulse: 72   Temp: 36.5 C (97.7 F)   TempSrc: Temporal   SpO2: 97%   Weight: 71.7 kg (158 lb)   Height: 1.676 m ('5\' 6"'$ )   BMI: 25.56     Physical Exam  Vitals and nursing note reviewed.   Constitutional:       General: He is not in acute distress.     Appearance: Normal appearance. He is not ill-appearing.   HENT:      Head: Normocephalic and atraumatic.      Right Ear: External ear normal.      Left Ear: External ear normal.  Nose: Nose normal.      Mouth/Throat:      Mouth: Mucous membranes are moist.      Pharynx: Oropharynx is clear.   Eyes:      Pupils: Pupils are equal, round, and reactive to light.   Cardiovascular:      Rate and Rhythm: Normal rate and regular rhythm.      Pulses: Normal pulses.      Heart sounds: No murmur  heard.  Pulmonary:      Effort: Pulmonary effort is normal. No respiratory distress.      Breath sounds: Normal breath sounds. No wheezing.   Abdominal:      General: Bowel sounds are normal.      Palpations: Abdomen is soft.      Tenderness: There is no abdominal tenderness.   Musculoskeletal:         General: Normal range of motion.      Cervical back: Normal range of motion and neck supple.   Skin:     General: Skin is warm and dry.      Findings: No lesion or rash.   Neurological:      General: No focal deficit present.      Mental Status: He is alert and oriented to person, place, and time. Mental status is at baseline.      Gait: Gait normal.   Psychiatric:         Mood and Affect: Mood normal.         Behavior: Behavior normal.         Thought Content: Thought content normal.         Judgment: Judgment normal.        Assessment:    ICD-10-CM    1. Frequent urination  R35.0 Referral to [facility], NON-FASTING     LIPID PANEL     VITAMIN D 25 TOTAL     CBC/DIFF      2. Vitamin D deficiency  E55.9 COMPREHENSIVE METABOLIC PANEL, NON-FASTING     LIPID PANEL     VITAMIN D 25 TOTAL     CBC/DIFF      3. Hyperlipidemia, unspecified hyperlipidemia type  E78.5 COMPREHENSIVE METABOLIC PANEL, NON-FASTING     LIPID PANEL     VITAMIN D 25 TOTAL     CBC/DIFF      4. Essential hypertension  I10 COMPREHENSIVE METABOLIC PANEL, NON-FASTING     LIPID PANEL     VITAMIN D 25 TOTAL     CBC/DIFF      5. Colon cancer screening  Z12.11 Cologuard           Plan:  Orders Placed This Encounter    COMPREHENSIVE METABOLIC PANEL, NON-FASTING    LIPID PANEL    VITAMIN D 25 TOTAL    CBC/DIFF    Cologuard    Referral to [place]    traMADoL (ULTRAM) 50 mg Oral Tablet     Refilled tramadol until he could see another PCP  Referral to urology  Labs ordered today, will contact patient with results.  Continue current meds and management.  Return to clinic 4 months.            No follow-ups on file.    Daiva Huge, DO     Portions of this note may be dictated using voice recognition software or a dictation service. Variances in spelling and vocabulary  are possible and unintentional. Not all errors are caught/corrected. Please notify the [facility] if any discrepancies are noted or if the meaning of any statement is not clear.

## 2022-02-17 LAB — ECG W INTERP (AMB USE ONLY)(MUSE,IN CLINIC)
Atrial Rate: 63 {beats}/min
Calculated P Axis: 75 degrees
Calculated R Axis: 48 degrees
Calculated T Axis: 82 degrees
PR Interval: 178 ms
QRS Duration: 84 ms
QT Interval: 442 ms
QTC Calculation: 452 ms
Ventricular rate: 63 {beats}/min

## 2022-03-03 ENCOUNTER — Other Ambulatory Visit: Payer: Medicare PPO | Attending: Family Medicine

## 2022-03-03 ENCOUNTER — Other Ambulatory Visit: Payer: Self-pay

## 2022-03-03 DIAGNOSIS — E785 Hyperlipidemia, unspecified: Secondary | ICD-10-CM | POA: Insufficient documentation

## 2022-03-03 DIAGNOSIS — E559 Vitamin D deficiency, unspecified: Secondary | ICD-10-CM | POA: Insufficient documentation

## 2022-03-03 DIAGNOSIS — I10 Essential (primary) hypertension: Secondary | ICD-10-CM | POA: Insufficient documentation

## 2022-03-03 DIAGNOSIS — R350 Frequency of micturition: Secondary | ICD-10-CM | POA: Insufficient documentation

## 2022-03-03 LAB — CBC WITH DIFF
BASOPHIL #: 0.1 10*3/uL (ref 0.00–0.10)
BASOPHIL %: 1 % (ref 0–1)
EOSINOPHIL #: 0.3 10*3/uL (ref 0.00–0.50)
EOSINOPHIL %: 3 %
HCT: 43.6 % (ref 36.7–47.1)
HGB: 14.7 g/dL (ref 12.5–16.3)
LYMPHOCYTE #: 2.6 10*3/uL (ref 1.00–3.00)
LYMPHOCYTE %: 31 % (ref 16–44)
MCH: 30.6 pg (ref 23.8–33.4)
MCHC: 33.7 g/dL (ref 32.5–36.3)
MCV: 90.6 fL (ref 73.0–96.2)
MONOCYTE #: 0.7 10*3/uL (ref 0.30–1.00)
MONOCYTE %: 8 % (ref 5–13)
MPV: 9.5 fL (ref 7.4–11.4)
NEUTROPHIL #: 5 10*3/uL (ref 1.85–7.80)
NEUTROPHIL %: 58 % (ref 43–77)
PLATELETS: 271 10*3/uL (ref 140–440)
RBC: 4.81 10*6/uL (ref 4.06–5.63)
RDW: 14 % (ref 12.1–16.2)
WBC: 8.6 10*3/uL (ref 3.6–10.2)

## 2022-03-03 LAB — COMPREHENSIVE METABOLIC PANEL, NON-FASTING
ALBUMIN/GLOBULIN RATIO: 1.8 — ABNORMAL HIGH (ref 0.8–1.4)
ALBUMIN: 4.2 g/dL (ref 3.5–5.7)
ALKALINE PHOSPHATASE: 98 U/L (ref 34–104)
ALT (SGPT): 13 U/L (ref 7–52)
ANION GAP: 3 mmol/L — ABNORMAL LOW (ref 4–13)
AST (SGOT): 18 U/L (ref 13–39)
BILIRUBIN TOTAL: 0.5 mg/dL (ref 0.3–1.2)
BUN/CREA RATIO: 15 (ref 6–22)
BUN: 15 mg/dL (ref 7–25)
CALCIUM, CORRECTED: 9.2 mg/dL (ref 8.9–10.8)
CALCIUM: 9.4 mg/dL (ref 8.6–10.3)
CHLORIDE: 109 mmol/L — ABNORMAL HIGH (ref 98–107)
CO2 TOTAL: 30 mmol/L (ref 21–31)
CREATININE: 0.97 mg/dL (ref 0.60–1.30)
ESTIMATED GFR: 82 mL/min/{1.73_m2} (ref >59)
GLOBULIN: 2.4 — ABNORMAL LOW (ref 2.9–5.4)
GLUCOSE: 72 mg/dL — ABNORMAL LOW (ref 74–109)
OSMOLALITY, CALCULATED: 283 mOsm/kg (ref 270–290)
POTASSIUM: 3.9 mmol/L (ref 3.5–5.1)
PROTEIN TOTAL: 6.6 g/dL (ref 6.4–8.9)
SODIUM: 142 mmol/L (ref 136–145)

## 2022-03-03 LAB — LIPID PANEL
CHOL/HDL RATIO: 4.2
CHOLESTEROL: 89 mg/dL (ref <200)
HDL CHOL: 21 mg/dL — ABNORMAL LOW (ref 23–92)
LDL CALC: 46 mg/dL (ref 0–100)
TRIGLYCERIDES: 111 mg/dL (ref <=150)
VLDL CALC: 22 mg/dL (ref 0–50)

## 2022-03-03 LAB — VITAMIN D 25 TOTAL: VITAMIN D: 88 ng/mL (ref 30–100)

## 2022-03-05 ENCOUNTER — Other Ambulatory Visit (INDEPENDENT_AMBULATORY_CARE_PROVIDER_SITE_OTHER): Payer: Self-pay | Admitting: Family Medicine

## 2022-03-06 ENCOUNTER — Other Ambulatory Visit (INDEPENDENT_AMBULATORY_CARE_PROVIDER_SITE_OTHER): Payer: Self-pay | Admitting: Family Medicine

## 2022-03-06 MED ORDER — SERTRALINE 50 MG TABLET
50.0000 mg | ORAL_TABLET | Freq: Every day | ORAL | 1 refills | Status: DC
Start: 2022-03-06 — End: 2022-03-15

## 2022-03-06 MED ORDER — NITROGLYCERIN 0.4 MG SUBLINGUAL TABLET
SUBLINGUAL_TABLET | SUBLINGUAL | 1 refills | Status: DC
Start: 2022-03-06 — End: 2023-10-03

## 2022-03-06 MED ORDER — TAMSULOSIN 0.4 MG CAPSULE
0.4000 mg | ORAL_CAPSULE | Freq: Every day | ORAL | 1 refills | Status: DC
Start: 2022-03-06 — End: 2022-09-15

## 2022-03-06 MED ORDER — FINASTERIDE 5 MG TABLET
5.0000 mg | ORAL_TABLET | Freq: Every day | ORAL | 1 refills | Status: DC
Start: 2022-03-06 — End: 2022-09-18

## 2022-03-06 MED ORDER — FAMOTIDINE 40 MG TABLET
40.0000 mg | ORAL_TABLET | Freq: Two times a day (BID) | ORAL | 1 refills | Status: DC
Start: 2022-03-06 — End: 2022-11-30

## 2022-03-07 LAB — COLOGUARD® COLON CANCER SCREEN: COLOGUARD RESULT: POSITIVE — AB

## 2022-03-07 LAB — FECAL DNA TESTING (AMB): FECAL DNA TEST (AMB): POSITIVE — AB

## 2022-03-09 ENCOUNTER — Encounter (INDEPENDENT_AMBULATORY_CARE_PROVIDER_SITE_OTHER): Payer: Self-pay

## 2022-03-15 ENCOUNTER — Ambulatory Visit (RURAL_HEALTH_CENTER): Payer: Medicare PPO | Attending: Family | Admitting: Family

## 2022-03-15 ENCOUNTER — Other Ambulatory Visit: Payer: Self-pay

## 2022-03-15 ENCOUNTER — Ambulatory Visit: Payer: Medicare PPO | Attending: Family | Admitting: Family

## 2022-03-15 ENCOUNTER — Encounter (RURAL_HEALTH_CENTER): Payer: Self-pay | Admitting: Family

## 2022-03-15 VITALS — BP 130/70 | HR 59 | Temp 98.3°F | Resp 17 | Ht 66.0 in | Wt 155.2 lb

## 2022-03-15 DIAGNOSIS — G4733 Obstructive sleep apnea (adult) (pediatric): Secondary | ICD-10-CM | POA: Insufficient documentation

## 2022-03-15 DIAGNOSIS — I2510 Atherosclerotic heart disease of native coronary artery without angina pectoris: Secondary | ICD-10-CM | POA: Insufficient documentation

## 2022-03-15 DIAGNOSIS — M5440 Lumbago with sciatica, unspecified side: Secondary | ICD-10-CM | POA: Insufficient documentation

## 2022-03-15 DIAGNOSIS — N400 Enlarged prostate without lower urinary tract symptoms: Secondary | ICD-10-CM | POA: Insufficient documentation

## 2022-03-15 DIAGNOSIS — N138 Other obstructive and reflux uropathy: Secondary | ICD-10-CM | POA: Insufficient documentation

## 2022-03-15 DIAGNOSIS — I10 Essential (primary) hypertension: Secondary | ICD-10-CM | POA: Insufficient documentation

## 2022-03-15 DIAGNOSIS — G8929 Other chronic pain: Secondary | ICD-10-CM | POA: Insufficient documentation

## 2022-03-15 DIAGNOSIS — W268XXA Contact with other sharp object(s), not elsewhere classified, initial encounter: Secondary | ICD-10-CM | POA: Insufficient documentation

## 2022-03-15 DIAGNOSIS — N401 Enlarged prostate with lower urinary tract symptoms: Secondary | ICD-10-CM | POA: Insufficient documentation

## 2022-03-15 DIAGNOSIS — E559 Vitamin D deficiency, unspecified: Secondary | ICD-10-CM | POA: Insufficient documentation

## 2022-03-15 DIAGNOSIS — R195 Other fecal abnormalities: Secondary | ICD-10-CM | POA: Insufficient documentation

## 2022-03-15 DIAGNOSIS — F17200 Nicotine dependence, unspecified, uncomplicated: Secondary | ICD-10-CM | POA: Insufficient documentation

## 2022-03-15 DIAGNOSIS — F17210 Nicotine dependence, cigarettes, uncomplicated: Secondary | ICD-10-CM | POA: Insufficient documentation

## 2022-03-15 DIAGNOSIS — S6992XA Unspecified injury of left wrist, hand and finger(s), initial encounter: Secondary | ICD-10-CM | POA: Insufficient documentation

## 2022-03-15 DIAGNOSIS — K5900 Constipation, unspecified: Secondary | ICD-10-CM | POA: Insufficient documentation

## 2022-03-15 DIAGNOSIS — I25708 Atherosclerosis of coronary artery bypass graft(s), unspecified, with other forms of angina pectoris: Secondary | ICD-10-CM | POA: Insufficient documentation

## 2022-03-15 DIAGNOSIS — E785 Hyperlipidemia, unspecified: Secondary | ICD-10-CM | POA: Insufficient documentation

## 2022-03-15 DIAGNOSIS — K219 Gastro-esophageal reflux disease without esophagitis: Secondary | ICD-10-CM | POA: Insufficient documentation

## 2022-03-15 DIAGNOSIS — S61012A Laceration without foreign body of left thumb without damage to nail, initial encounter: Secondary | ICD-10-CM | POA: Insufficient documentation

## 2022-03-15 DIAGNOSIS — Z8719 Personal history of other diseases of the digestive system: Secondary | ICD-10-CM | POA: Insufficient documentation

## 2022-03-15 DIAGNOSIS — F419 Anxiety disorder, unspecified: Secondary | ICD-10-CM | POA: Insufficient documentation

## 2022-03-15 DIAGNOSIS — Z23 Encounter for immunization: Secondary | ICD-10-CM | POA: Insufficient documentation

## 2022-03-15 DIAGNOSIS — C4490 Unspecified malignant neoplasm of skin, unspecified: Secondary | ICD-10-CM | POA: Insufficient documentation

## 2022-03-15 LAB — PSA SCREENING: PSA: 0.88 ng/mL (ref <=4.00)

## 2022-03-15 MED ORDER — SERTRALINE 100 MG TABLET
100.0000 mg | ORAL_TABLET | Freq: Every evening | ORAL | 0 refills | Status: DC
Start: 2022-03-15 — End: 2022-06-15

## 2022-03-15 NOTE — Assessment & Plan Note (Signed)
Well controlled according to last set of labs drawn by Dr. Kristine Linea.  Continue weekly vitamin-D supplement

## 2022-03-15 NOTE — Assessment & Plan Note (Signed)
We will send patient for x-ray of that left hand to further evaluate.  Does not appear infected at this point, but obviously painful on palpation. Possible retained material.  I am also going to give him a Tdap at today's visit.

## 2022-03-15 NOTE — Assessment & Plan Note (Signed)
Controlled with CPAP.

## 2022-03-15 NOTE — Assessment & Plan Note (Signed)
Not well controlled at this point.  I will increase his SSRI to see if this will improve his anxiety.   CMP from 09/22 reviewed, no significant findings

## 2022-03-15 NOTE — Assessment & Plan Note (Signed)
Pt manages with PRN Ultram. I will take over prescribing the medication. Currently, he does not need a refill. But when he does he knows that he will need to sign a controlled substance policy with me.

## 2022-03-15 NOTE — Assessment & Plan Note (Signed)
Treated with MiraLax, continue as needed

## 2022-03-15 NOTE — Assessment & Plan Note (Signed)
We discussed the importance of cessation. He has been smoking since his 20's. I will order a CT Lung Cancer Screening at this time.   I also suggested nicotine gum as an alternative to smoking.

## 2022-03-15 NOTE — Assessment & Plan Note (Addendum)
Continues to have symptoms despite micturition agents.   He would also like to have his testosterone checked, and medication adjusted.  I am going to send in a new referral for Urology.  He currently is set up to see to [facility] in [place] that is too far out.  I will get an updated PSA level today.  Continue his current BPH medication tamsulosin and Proscar.  I am not going to adjust his testosterone medication or obtain a testosterone level at this time, I will allow Urology to manage.

## 2022-03-15 NOTE — Progress Notes (Signed)
FAMILY MEDICINE, [place]  [place] [id number]  Operated by [facility]     Name: Corey [name] MRN:  [id number]   Date of Birth: 04/15/1946 Age: 76 y.o.   Date: 03/15/2022  Time: 10:27     Provider: Illa Level, FNP    Reason for visit: Establish Care      History of Present Illness:  Corey [name] is a 76 y.o. male pt presents to the clinic to [facility] care. He is a prior pt of Dr Zola Button.     Verbal Medical History:   He denies any issues with COPD or Asthma. He is not currently using his Combivent. Can not really remember why he was place on the inhaler.   OSA- he uses his CPAP.  CAD: history of quad bipass. This past February. No residual Chest pain. He see Dr Marcille Blanco. He last saw Dr Marcille Blanco last week.  He takes his Metoprolol consistently.  He not sure why Dr Marcille Blanco has him on Plavix and ASA.  He has a new bottle of Nitroglycerin.   He has had to taken any in quite a while.  Bad heart Valve with Murmur.  Hyperlipidemia: managed with is atorvastatin. He tolerating his cholesterol pill without issues. He denies any history of stroke, no currrent stroke like symptoms.   He tells me he had some rectal bleeding about 1.5 months ago while constipating.   Constipation: he treats with Mirlax " when he can remember'   He denies any history of HTN, but he consistently takes his amlodipine.   GERD: He takes a famotidine to manage. The does have some breakthrough episodes that occure every 2 weeks. He uses Baking soda for breakthrough.   BPH: He tells me he has an appt next March for a Urologist. "Which is ridcoulour"   He tells me the medication is not helping with the night time freguent urination.  Slow flow and no drippling.   He takes testosterone injections about every 3 weeks. Due to fatique.   GAD: He trying to quit smoking. He tells me his daughter wanted him to double the medication. He tells me he just get so nervous.    Smoking Cessation:  he tells me he is smoking about a pack per day.   History of low back pain: He has had surgery on his lower back 3 years ago.   He uses his tramadol when he goes golf.     He is unsure of his Vitamin D supplement: He takes a vitamin supplement weekly.   Basal Cell Cancer- See [facility].     He tells me he does not want a Colonoscopy. He tells me he can find out at his 'autospy"     At today's appt he would like to address a cut on his L hand at the MCP of the thumb. He tells me he cut it on a piece of steel. It has healed but it continues to remain swollen and painful. He can not recall his last Tetanus booster..       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    Chronic back pain 12/21/2021    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    Essential hypertension 12/21/2021    Anxiety 12/21/2021    Osteoarthritis 12/21/2021    Vitamin D deficiency 12/21/2021    Benign prostatic hyperplasia 12/21/2021    Renal mass 12/21/2021    Current smoker 12/21/2021    Thyroid nodule 12/21/2021    Hyperlipidemia 08/29/2021    Heart murmur 08/29/2021    Obstructive sleep apnea 08/29/2021    Right carotid bruit 08/29/2021    GERD (gastroesophageal reflux disease) 08/29/2021       Historical Data    Past Medical History:  Past Medical History:   Diagnosis Date    Abnormal laboratory test     Abnormal prostate specific antigen     Anxiety     Arteriosclerotic vascular disease     Atypical chest pain     BPH (benign prostatic hyperplasia)     Coronary artery disease     Depression     Diarrhea     Esophageal reflux     Fatigue     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    [place]   Dr Cherylynn Ridges    Hypercholesterolemia     Hypertension     IBS (irritable bowel syndrome)     Left foot pain     Leukocytosis     Lumbar disc herniation with radiculopathy     Narcolepsy     Neck pain     OSA (obstructive sleep apnea)     Osteoarthritis     Paresthesia     Previous back surgery     Right carotid bruit     Right hip pain     Thigh pain     Thyroid nodule     Weight loss, non-intentional      Past Surgical History:  Past Surgical History:   Procedure Laterality Date    CARDIAC CATHETERIZATION  07/13/2021    [id number]    Dr Annie Main Ward    DENTAL SURGERY      HX BACK SURGERY      HX COLONOSCOPY  2019    had done at [place]  08/03/2021    Dr Cipriano Bunker    HX HERNIA REPAIR      HX LAP CHOLECYSTECTOMY      ORTHOPEDIC SURGERY       Allergies:  Allergies   Allergen Reactions    Penicillins  Other Adverse Reaction (Add comment)     Childhood allergy, unknown reaction     Medications:  Current Outpatient Medications   Medication Sig    amLODIPine (NORVASC) 5 mg Oral Tablet Take 1 Tablet (5 mg total) by mouth Once a day    aspirin (ECOTRIN) 81 mg Oral Tablet, Delayed Release (E.C.) Take 1 Tablet (81 mg total) by mouth Once a day    atorvastatin (LIPITOR) 40 mg Oral Tablet Take 1 Tablet (40 mg total) by mouth Once a day    clopidogreL (PLAVIX) 75 mg Oral Tablet Take 1 Tablet (75 mg total) by mouth Once a day    ergocalciferol, vitamin D2, (DRISDOL) 1,250 mcg (50,000 unit) Oral Capsule Take 1 Capsule (50,000 Units total) by mouth Every 7 days    famotidine (PEPCID) 40 mg Oral Tablet Take 1 Tablet (40 mg total) by mouth Twice daily    finasteride (PROSCAR) 5 mg Oral Tablet Take 1 Tablet (5  mg total) by mouth Once a day    metoprolol succinate (TOPROL-XL) 25 mg Oral Tablet Sustained Release 24 hr Take 1 Tablet (25 mg total) by mouth Once a day    nitroGLYCERIN (NITROSTAT) 0.4 mg Sublingual Tablet, Sublingual DISSOLVE 1 TAB UNDER TONGUE EVERY 5 MINUTES AS NEEDED FOR CHEST PAIN. DO NOT  EXCEED 3 DOSES IN 15 MINUTES.    sertraline (ZOLOFT) 100 mg Oral Tablet Take 1 Tablet (100 mg total) by mouth Every night for 90 days Indications: repeated episodes of anxiety    tamsulosin (FLOMAX) 0.4 mg Oral Capsule Take 1 Capsule (0.4 mg total) by mouth Once a day    traMADoL (ULTRAM) 50 mg Oral Tablet Take 1 Tablet (50 mg total) by mouth Twice per day as needed for Pain for up to 180 days     Family History:  Family Medical History:       Problem Relation (Age of Onset)    Brain cancer Father    Heart Disease Mother    Hypertension (High Blood Pressure) Mother            Social History:  Social History     Socioeconomic History    Marital status: Married     Spouse name: Bonnita Nasuti    Number of children: 2    Years of education: 12+    Highest education level: Bachelor's degree (e.g., BA, AB, BS)   Tobacco Use    Smoking status: Some Days     Packs/day: 1.00     Years: 54.00     Additional pack years: 0.00     Total pack years: 54.00     Types: Cigarettes     Start date: 1969    Smokeless tobacco: Never   Vaping Use    Vaping Use: Never used   Substance and Sexual Activity    Alcohol use: Yes     Alcohol/week: 3.0 standard drinks of alcohol     Types: 3 Cans of beer per week     Comment: occasionally    Drug use: Never     Social Determinants of Health     Financial Resource Strain: Low Risk  (03/15/2022)    Financial Resource Strain     SDOH Financial: No   Transportation Needs: Low Risk  (03/15/2022)    Transportation Needs     SDOH Transportation: No   Social Connections: Low Risk  (03/15/2022)    Social Connections     SDOH Social Isolation: 5 or more times a week   Intimate Partner Violence: Low Risk  (03/15/2022)    Intimate Partner Violence     SDOH Domestic Violence: No   Housing Stability: Low Risk  (03/15/2022)    Housing Stability     SDOH Housing Situation: I have housing.     SDOH Housing Worry: No           Review of Systems:  Any pertinent Review of Systems as addressed in the HPI above.    Physical  Exam:  Vital Signs:  Vitals:    03/15/22 0817   BP: 130/70   Pulse: 59   Resp: 17   Temp: 36.8 C (98.3 F)   TempSrc: Temporal   SpO2: 96%   Weight: 70.4 kg (155 lb 4 oz)   Height: 1.676 m ('5\' 6"'$ )   BMI: 25.11     Physical Exam  Vitals reviewed.   Constitutional:       Appearance: Normal appearance. He is well-developed,  well-groomed and normal weight.   HENT:      Head: Normocephalic and atraumatic.      Comments: Hearing loss is mild appear, by conversation.      Right Ear: Tympanic membrane, ear canal and external ear normal. Decreased hearing noted.      Left Ear: Tympanic membrane, ear canal and external ear normal. Decreased hearing noted.      Nose: Nose normal.      Mouth/Throat:      Lips: Pink.      Mouth: Mucous membranes are moist.      Pharynx: Oropharynx is clear.   Eyes:      General: Lids are normal. Gaze aligned appropriately.      Extraocular Movements: Extraocular movements intact.      Conjunctiva/sclera: Conjunctivae normal.   Neck:      Thyroid: No thyroid mass, thyromegaly or thyroid tenderness.      Vascular: Carotid bruit (Most likely from the heart valve dysfunction.  We will continue to monitor) present.      Trachea: Trachea normal.   Cardiovascular:      Rate and Rhythm: Normal rate and regular rhythm.      Pulses: Normal pulses.      Heart sounds: S1 normal and S2 normal. Murmur (Grade 3) heard.   Pulmonary:      Effort: Pulmonary effort is normal.      Breath sounds: Normal breath sounds and air entry.      Comments: No coughing noted during interview and exam  Abdominal:      General: Abdomen is flat. Bowel sounds are normal. There is no distension.      Palpations: Abdomen is soft.      Tenderness: There is no abdominal tenderness. There is no right CVA tenderness or left CVA tenderness.   Musculoskeletal:      Cervical back: Neck supple. No muscular tenderness.      Right lower leg: No edema.      Left lower leg: No edema.   Lymphadenopathy:      Cervical: No cervical adenopathy.    Skin:     General: Skin is warm.      Coloration: Skin is not cyanotic or pale.      Nails: There is no clubbing.   Neurological:      Mental Status: He is alert and oriented to person, place, and time.   Psychiatric:         Attention and Perception: Attention normal.         Mood and Affect: Mood and affect normal.         Speech: Speech normal.         Behavior: Behavior normal. Behavior is cooperative.          Assessment/Plan:  (N40.0) Benign prostatic hyperplasia, unspecified whether lower urinary tract symptoms present  (primary encounter diagnosis)  Plan: Referral to External Provider (AMB), PSA         SCREENING    (G81.85UD) Hand trauma, left, initial encounter  Plan: XR HAND LEFT    (F17.200) Current smoker  Plan: CT LUNG SCREENING LDCT    (E78.5) Hyperlipidemia    (I25.708) Coronary artery disease of bypass graft of native heart with stable angina pectoris (CMS [facility])    (G47.33) Obstructive sleep apnea    (F17.210) Cigarette smoker    (M54.40,  G89.29) Chronic low back pain with sciatica, sciatica laterality unspecified, unspecified back pain laterality    (I10) Essential hypertension    (  N40.1,  N13.8) 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    (K21.9) Gastroesophageal reflux disease without esophagitis    (E55.9) Vitamin D deficiency    (F41.9) Anxiety    (C44.90) Skin cancer    (K59.00) Constipation    (R19.5) Positive colorectal cancer screening using Cologuard test    (Z23) Influenza vaccine administered    (Z23) Need for tetanus, diphtheria, and acellular pertussis (Tdap) vaccine     Problem List Items Addressed This Visit          Cardiovascular System    Hyperlipidemia     Well controlled. Recent Labs reviewed with Pt.  Continue current statin. Monitor diet, maintaining heart healthy diet.          Essential hypertension     Pt denies. However, BP is stable.   Will continue his antihypertensive, amlodipine.            Coronary artery disease  of bypass graft of native heart with stable angina pectoris (CMS HCC)     Appears stable at this time. He is managed by Dr Marcille Blanco.   Continue medications as prescribed by [facility]  Will request updated records.             Respiratory    Obstructive sleep apnea     Controlled with CPAP.          Current smoker     We discussed the importance of cessation. He has been smoking since his 20's. I will order a CT Lung Cancer Screening at this time.   I also suggested nicotine gum as an alternative to smoking.         Relevant Orders    CT LUNG SCREENING LDCT       Digestive    GERD (gastroesophageal reflux disease)     Fairly well controlled.  Continue his H2 blocker.  I have cautioned patient about using baking soda for GERD treatment too often.          Constipation     Treated with MiraLax, continue as needed            Endocrine    Vitamin D deficiency     Well controlled according to last set of labs drawn by Dr. Kristine Linea.  Continue weekly vitamin-D supplement            Musculoskeletal    Chronic back pain     Pt manages with PRN Ultram. I will take over prescribing the medication. Currently, he does not need a refill. But when he does he knows that he will need to sign a controlled substance policy with me.          Hand trauma, left, initial encounter     We will send patient for x-ray of that left hand to further evaluate.  Does not appear infected at this point, but obviously painful on palpation. Possible retained material.  I am also going to give him a Tdap at today's visit.          Relevant Orders    XR HAND LEFT       Urology    Benign prostatic hyperplasia - Primary     Continues to have symptoms despite micturition agents.   He would also like to have his testosterone checked, and medication adjusted.  I am going to send in a new referral for Urology.  He currently is set up to see to [facility] in [employer]  that is too far out.  I will get an updated PSA level today.  Continue his current BPH medication  tamsulosin and Proscar.  I am not going to adjust his testosterone medication or obtain a testosterone level at this time, I will allow Urology to manage.          Relevant Orders    Referral to External Provider (AMB)    PSA SCREENING       Psychiatric    Anxiety     Not well controlled at this point.  I will increase his SSRI to see if this will improve his anxiety.   CMP from 09/22 reviewed, no significant findings            Dermatology    Skin cancer     Per patient this is an issue with basal cell, and extended sun exposure.  He is currently having some skin lesions removed.  We did discuss the importance of using sunblock.  He will continue to see derm 1, I will request some records            Other    Positive colorectal cancer screening using Cologuard test     Positive screening discussed, patient is declining any further workup including a colonoscopy.           Other Visit Diagnoses       Influenza vaccine administered        Need for tetanus, diphtheria, and acellular pertussis (Tdap) vaccine              No refills requested at this time.  Previous lab work obtained on 03/03/2022 reviewed with patient.      Return in about 3 months (around 06/15/2022) for Chronic Disease Management and Annual Wellness- Sept 2024.    Encarnacion Scioneaux L Ho Parisi, FNP     Portions of this note may be dictated using voice recognition software or a dictation service. Variances in spelling and vocabulary are possible and unintentional. Not all errors are caught/corrected. Please notify the [facility] if any discrepancies are noted or if the meaning of any statement is not clear.

## 2022-03-15 NOTE — Nursing Note (Signed)
Patient here today to establish care. Patient use to see Dr. Zola Button. Patient state that he would like to talk about his medication Zoloft, and a place on his hand that has been bothering him on his left hand  for about 3 weeks.

## 2022-03-15 NOTE — Assessment & Plan Note (Signed)
Positive screening discussed, patient is declining any further workup including a colonoscopy.

## 2022-03-15 NOTE — Assessment & Plan Note (Signed)
Appears stable at this time. He is managed by Dr Marcille Blanco.   Continue medications as prescribed by [facility]  Will request updated records.

## 2022-03-15 NOTE — Assessment & Plan Note (Signed)
Pt denies. However, BP is stable.   Will continue his antihypertensive, amlodipine.

## 2022-03-15 NOTE — Assessment & Plan Note (Signed)
Well controlled. Recent Labs reviewed with Pt.  Continue current statin. Monitor diet, maintaining heart healthy diet.

## 2022-03-15 NOTE — Assessment & Plan Note (Signed)
Per patient this is an issue with basal cell, and extended sun exposure.  He is currently having some skin lesions removed.  We did discuss the importance of using sunblock.  He will continue to see derm 1, I will request some records

## 2022-03-15 NOTE — Assessment & Plan Note (Signed)
Fairly well controlled.  Continue his H2 blocker.  I have cautioned patient about using baking soda for GERD treatment too often.

## 2022-03-16 IMAGING — DX XRAY HAND MINIMUM 3 VIEWS LT
1 series · 3 of 3 positions shown · non-contrast
Comparison: None available.

﻿EXAM:  33731      XRAY HAND MINIMUM 3 VIEWS LT
INDICATION: Left thumb pain and swelling.

[Series 1: PA · 0.14mm/px · 3 of 3 slices shown]
[im 1/3]
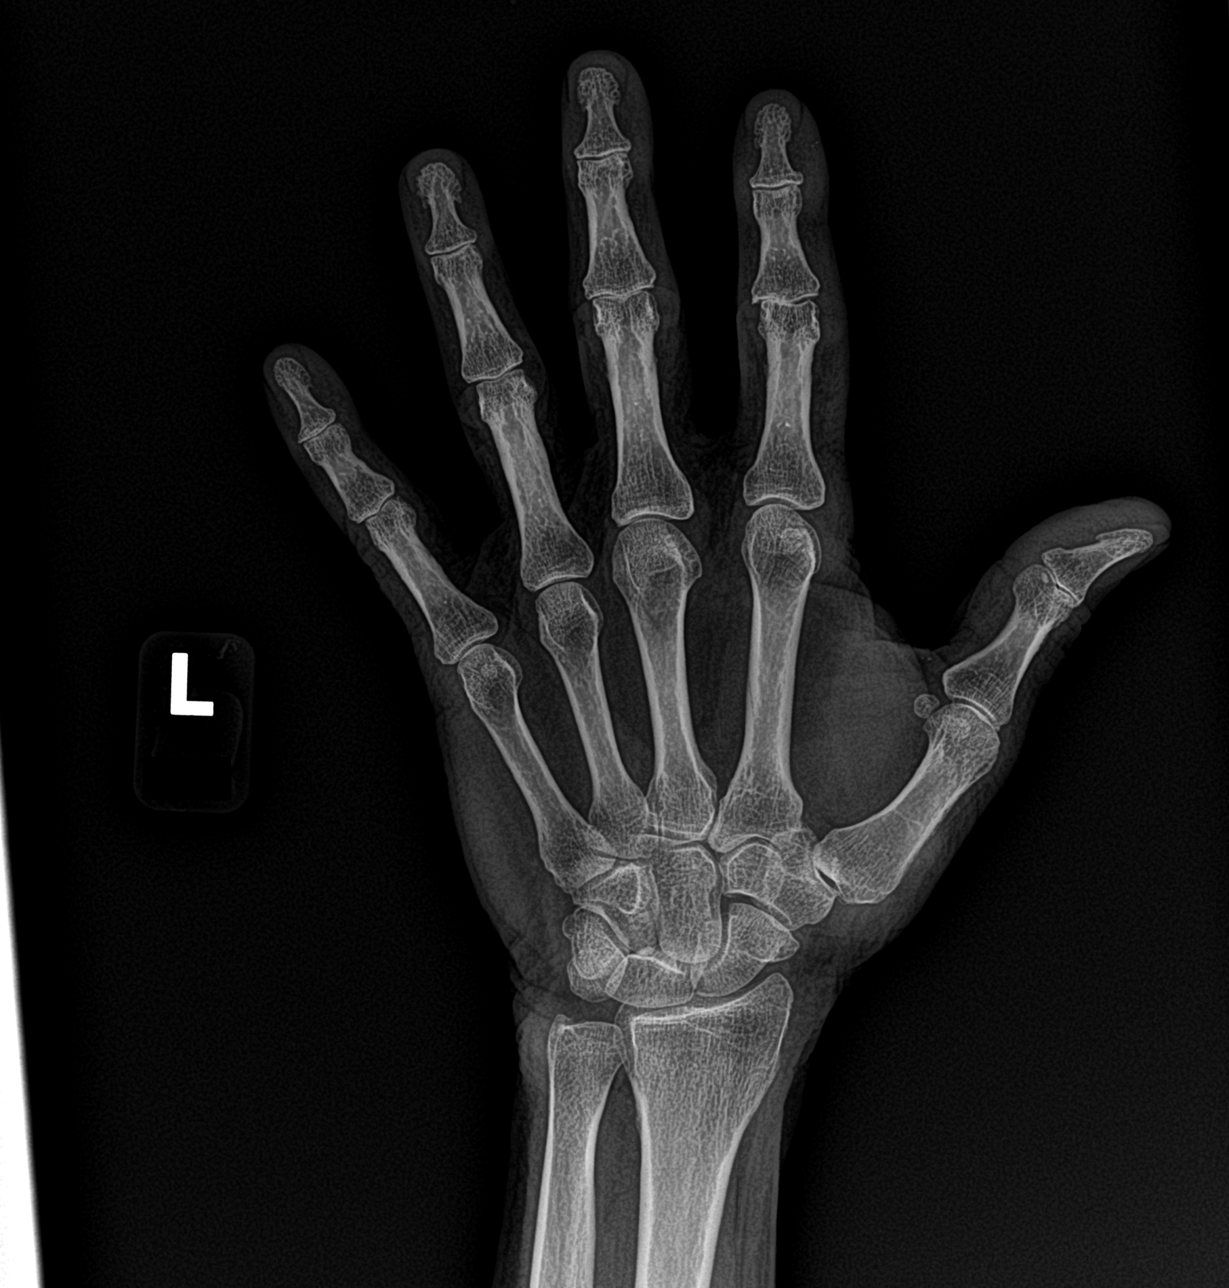
[im 2/3]
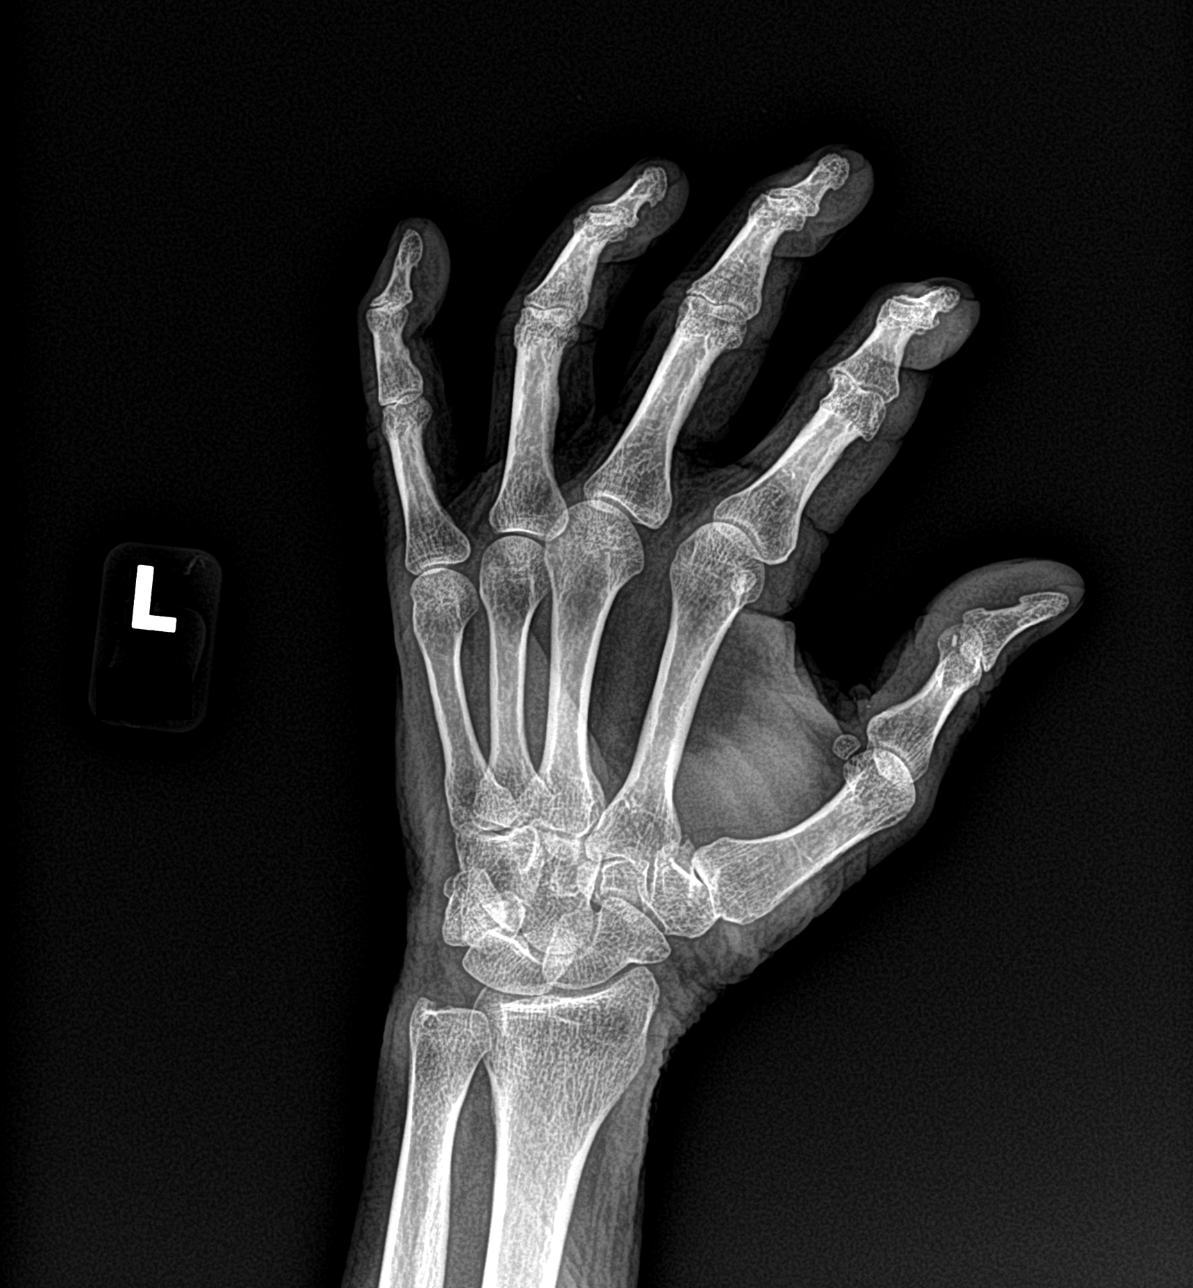
[im 3/3]
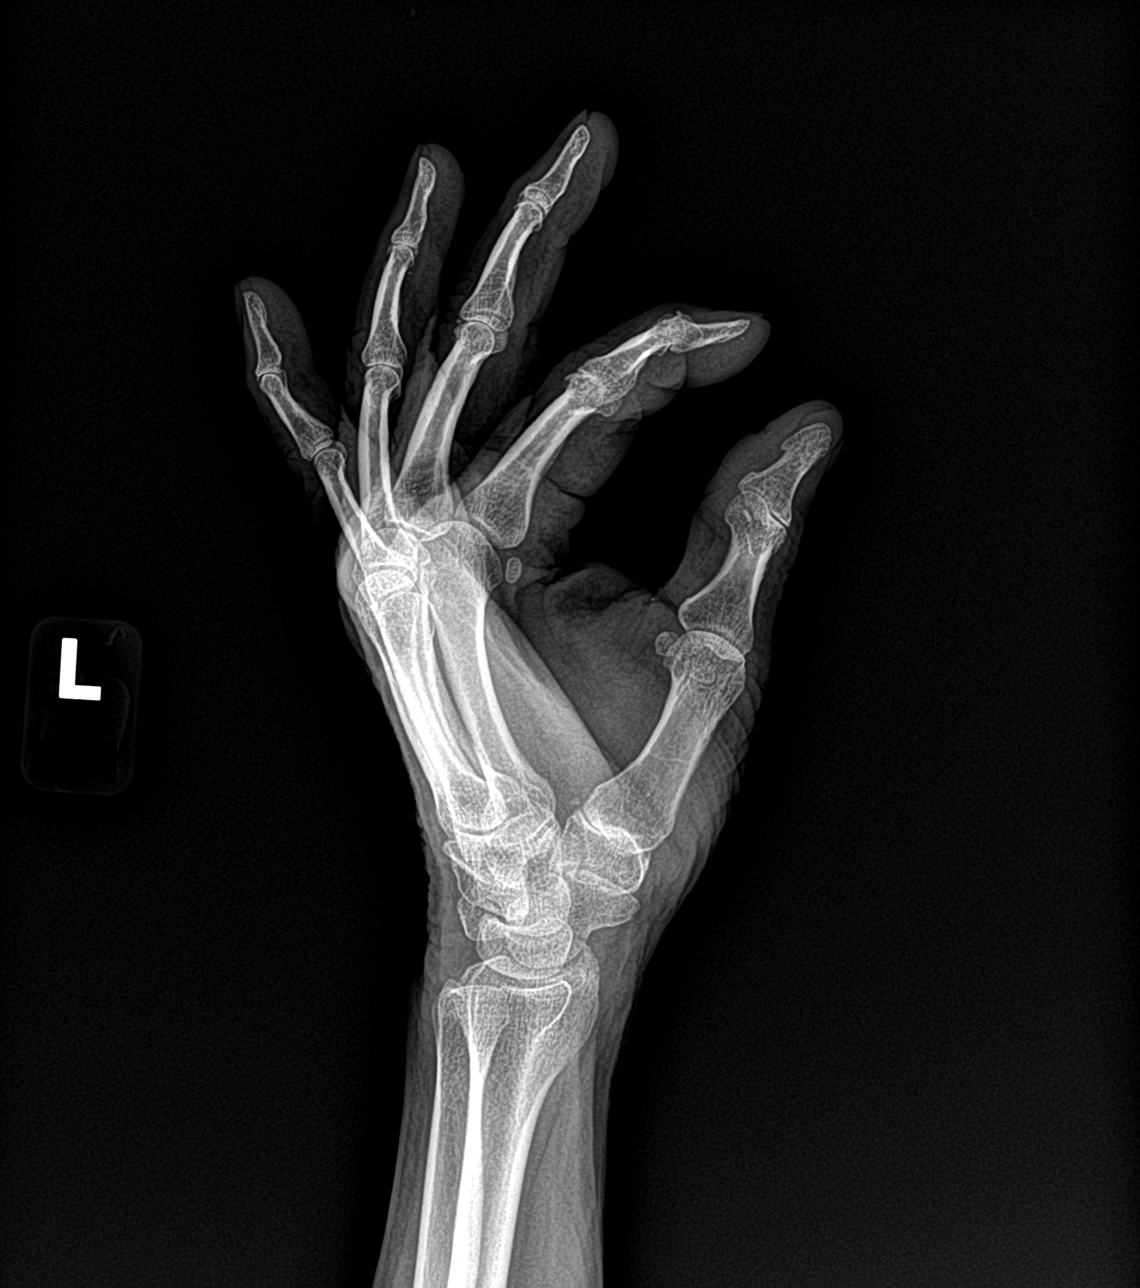

[3 of 3 positions shown; findings below may reference images not displayed]

FINDINGS: Three views demonstrate no apparent fracture.  There is no radiopaque foreign body.  Mild degenerative changes are noted incidentally.
IMPRESSION: No fracture is seen.

## 2022-03-21 NOTE — Result Encounter Note (Signed)
Will you give Corey [name] a call and let him know that his PSA was normal.   I am still waiting on the Xray result for his hand... When does he plan of doing that?  (Document his answer well, including any reasons for not obtain as of yet...)  And the referral team is actively working on the Urology referral.     Thanks Arletha Marschke.

## 2022-03-22 ENCOUNTER — Telehealth (RURAL_HEALTH_CENTER): Payer: Self-pay | Admitting: Family

## 2022-03-22 NOTE — Telephone Encounter (Signed)
Xray of the L hand was negative for any radiopaque foreign body. Will have [facility] call with results and check on the finger. If still painful, will ask him if he wants to go to general surgery for further assessment.

## 2022-03-23 ENCOUNTER — Telehealth (RURAL_HEALTH_CENTER): Payer: Self-pay | Admitting: Family

## 2022-03-23 NOTE — Telephone Encounter (Signed)
-----   Message from Amy L Harrup, FNP sent at 03/21/2022  9:02 AM EDT -----  Will you give Corey [name] a call and let him know that his PSA was normal.   I am still waiting on the Xray result for his hand... When does he plan of doing that?  (Document his answer well, including any reasons for not obtain as of yet...)  And the referral team is actively working on the Urology referral.     Thanks Amy.

## 2022-03-24 ENCOUNTER — Telehealth (RURAL_HEALTH_CENTER): Payer: Self-pay | Admitting: Family

## 2022-03-24 NOTE — Telephone Encounter (Signed)
-----   Message from Illa Level, FNP sent at 03/21/2022  9:02 AM EDT -----  Will you give Corey [name] a call and let him know that his PSA was normal.   I am still waiting on the Xray result for his hand... When does he plan of doing that?  (Document his answer well, including any reasons for not obtain as of yet...)  And the referral team is actively working on the Urology referral.     Thanks Amy.

## 2022-03-24 NOTE — Telephone Encounter (Signed)
-----   Message from Illa Level, FNP sent at 03/22/2022 10:17 AM EDT -----  Regarding: hand Xray  Xray of the L hand was negative for any radiopaque foreign body. Will have [place] call with results and check on the finger. If still painful, will ask him if he wants to go to general surgery for further assessment.   Let me know what he wants to do.   Thanks Amy

## 2022-04-14 ENCOUNTER — Encounter (HOSPITAL_BASED_OUTPATIENT_CLINIC_OR_DEPARTMENT_OTHER): Payer: Self-pay

## 2022-04-14 ENCOUNTER — Emergency Department (HOSPITAL_BASED_OUTPATIENT_CLINIC_OR_DEPARTMENT_OTHER): Payer: Medicare PPO

## 2022-04-14 ENCOUNTER — Emergency Department
Admission: EM | Admit: 2022-04-14 | Discharge: 2022-04-14 | Disposition: A | Discharge status: Home / Self Care | Payer: Medicare PPO | Attending: NURSE PRACTITIONER | Admitting: NURSE PRACTITIONER

## 2022-04-14 ENCOUNTER — Other Ambulatory Visit: Payer: Self-pay

## 2022-04-14 DIAGNOSIS — S0633AA Contusion and laceration of cerebrum, unspecified, with loss of consciousness status unknown, initial encounter: Secondary | ICD-10-CM | POA: Insufficient documentation

## 2022-04-14 DIAGNOSIS — S0181XA Laceration without foreign body of other part of head, initial encounter: Secondary | ICD-10-CM | POA: Insufficient documentation

## 2022-04-14 DIAGNOSIS — S062XAA Diffuse traumatic brain injury with loss of consciousness status unknown, initial encounter (CMS HCC): Secondary | ICD-10-CM

## 2022-04-14 DIAGNOSIS — Z7901 Long term (current) use of anticoagulants: Secondary | ICD-10-CM | POA: Insufficient documentation

## 2022-04-14 DIAGNOSIS — S41112A Laceration without foreign body of left upper arm, initial encounter: Secondary | ICD-10-CM | POA: Insufficient documentation

## 2022-04-14 DIAGNOSIS — S61212A Laceration without foreign body of right middle finger without damage to nail, initial encounter: Secondary | ICD-10-CM | POA: Insufficient documentation

## 2022-04-14 DIAGNOSIS — G319 Degenerative disease of nervous system, unspecified: Secondary | ICD-10-CM | POA: Insufficient documentation

## 2022-04-14 MED ORDER — CYCLOBENZAPRINE 10 MG TABLET
10.0000 mg | ORAL_TABLET | Freq: Three times a day (TID) | ORAL | 0 refills | Status: AC
Start: 2022-04-14 — End: 2022-04-16

## 2022-04-14 MED ORDER — LIDOCAINE HCL 20 MG/ML (2 %) INJECTION SOLUTION
INTRAMUSCULAR | Status: AC
Start: 2022-04-14 — End: 2022-04-14
  Filled 2022-04-14: qty 20

## 2022-04-14 MED ORDER — METHOCARBAMOL 500 MG TABLET
1000.0000 mg | ORAL_TABLET | Freq: Once | ORAL | Status: AC
Start: 2022-04-14 — End: 2022-04-14
  Administered 2022-04-14: 1000 mg via ORAL

## 2022-04-14 MED ORDER — LIDOCAINE HCL 20 MG/ML (2 %) INJECTION SOLUTION
15.0000 mL | INTRAMUSCULAR | Status: AC
Start: 2022-04-14 — End: 2022-04-14
  Administered 2022-04-14: 300 mg via INTRADERMAL

## 2022-04-14 MED ORDER — BACITRACIN 500 UNIT/G OINTMENT TUBE
TOPICAL_OINTMENT | CUTANEOUS | Status: AC
Start: 2022-04-14 — End: 2022-04-14
  Filled 2022-04-14: qty 28.4

## 2022-04-14 MED ORDER — METHOCARBAMOL 500 MG TABLET
ORAL_TABLET | ORAL | Status: AC
Start: 2022-04-14 — End: 2022-04-14
  Filled 2022-04-14: qty 2

## 2022-04-14 NOTE — ED Nurses Note (Signed)
Patient discharged home with family.  AVS reviewed with patient/care giver.  A written copy of the AVS and discharge instructions was given to the patient/care giver. Scripts handed to patient/care giver. Questions sufficiently answered as needed.  Patient/care giver encouraged to follow up with PCP as indicated.  In the event of an emergency, patient/care giver instructed to call 911 or go to the nearest emergency room.

## 2022-04-14 NOTE — Discharge Instructions (Signed)
Follow-up with your PCP or return to the ED in 5-7 days to re-evaluate the sutures of your face.  The sutures in your hand may take longer approximately 7-10 days.  Keep bacitracin ointment to the area for the next 2 days.  Keep area clean and dry.  If redness or swelling occurs or any abnormal drainage report back to the ED or to your PCP.    Thank you for allowing [place] to be part of your care.    Please discuss all medications with your pharmacist to ensure there are no concerns of interactions.    Please ensure all questions or concerns are addressed prior to leaving the hospital. We want to make sure your concerns are addressed to make sure you are as safe and healthy as possible. By leaving the hospital, it is understood you are in agreement with your treatment plan.    You may have received sedating medication during your visit. Please discuss this with your discharging provider nurse as you may not be able to operate machines while the medication is in your system, or while you are taking any potentially sedating prescriptions.    Please call the hospital medical records office for a copy of your finalized results, and review them with a primary care physician, for any findings needing further attention.    If you feel your situation worsens, or does not get better in 48 hours, please see a physician for evaluation.    We encourage you to see your regular doctor as soon as possible to let them know you were seen in the emergency department. They may want to do further testing. If you do not have a doctor, please feel free to call the hospital, and ask for contact information of accepting providers. Please also discuss your vaccinations, and ensure all are up to date.    You may use this document to take today off work or school.

## 2022-04-14 NOTE — ED Nurses Note (Signed)
Dressings applied to patient's right middle finger, right forehead and left elbow. Patient tolerated well.
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
## 2022-04-15 ENCOUNTER — Telehealth (HOSPITAL_COMMUNITY): Payer: Self-pay | Admitting: Family

## 2022-04-15 NOTE — Progress Notes (Signed)
[place] ED Follow-Up:   Document completed and/or attempted interactive contact(s) after transition to home after emergency department stay.:   Transition Facility and relevant Date:   Discharge Date: 04/14/22  Discharge from [facility] Emergency Department?: Yes  Discharge Facility: [facility]  Contacted by: Corene Cornea, RN  Contact method: Patient/Caregiver Telephone  Contact first attempt: 04/15/2022 10:17 AM  Were they obtained?: No

## 2022-05-25 DIAGNOSIS — I110 Hypertensive heart disease with heart failure: Secondary | ICD-10-CM | POA: Insufficient documentation

## 2022-05-29 ENCOUNTER — Other Ambulatory Visit (INDEPENDENT_AMBULATORY_CARE_PROVIDER_SITE_OTHER): Payer: Self-pay | Admitting: Family

## 2022-06-01 ENCOUNTER — Other Ambulatory Visit (RURAL_HEALTH_CENTER): Payer: Self-pay | Admitting: Family

## 2022-06-01 MED ORDER — AMLODIPINE 5 MG TABLET
5.0000 mg | ORAL_TABLET | Freq: Every day | ORAL | 0 refills | Status: DC
Start: 2022-06-01 — End: 2022-06-15

## 2022-06-14 ENCOUNTER — Inpatient Hospital Stay
Admission: RE | Admit: 2022-06-14 | Discharge: 2022-06-14 | Disposition: A | Discharge status: Home / Self Care | Payer: Medicare PPO | Source: Ambulatory Visit | Attending: Family | Admitting: Family

## 2022-06-14 ENCOUNTER — Other Ambulatory Visit: Payer: Self-pay

## 2022-06-14 DIAGNOSIS — F17200 Nicotine dependence, unspecified, uncomplicated: Secondary | ICD-10-CM

## 2022-06-14 DIAGNOSIS — F17210 Nicotine dependence, cigarettes, uncomplicated: Secondary | ICD-10-CM | POA: Insufficient documentation

## 2022-06-14 DIAGNOSIS — J438 Other emphysema: Secondary | ICD-10-CM | POA: Insufficient documentation

## 2022-06-14 DIAGNOSIS — Z122 Encounter for screening for malignant neoplasm of respiratory organs: Secondary | ICD-10-CM | POA: Insufficient documentation

## 2022-06-15 ENCOUNTER — Encounter (RURAL_HEALTH_CENTER): Payer: Self-pay | Admitting: Family

## 2022-06-15 ENCOUNTER — Ambulatory Visit: Payer: Medicare PPO | Attending: Family | Admitting: Family

## 2022-06-15 ENCOUNTER — Ambulatory Visit (RURAL_HEALTH_CENTER): Payer: Medicare PPO | Attending: Family | Admitting: Family

## 2022-06-15 VITALS — BP 127/77 | HR 63 | Temp 97.5°F | Resp 18 | Ht 66.0 in | Wt 153.6 lb

## 2022-06-15 DIAGNOSIS — I25708 Atherosclerosis of coronary artery bypass graft(s), unspecified, with other forms of angina pectoris: Secondary | ICD-10-CM | POA: Insufficient documentation

## 2022-06-15 DIAGNOSIS — I10 Essential (primary) hypertension: Secondary | ICD-10-CM | POA: Insufficient documentation

## 2022-06-15 DIAGNOSIS — G4733 Obstructive sleep apnea (adult) (pediatric): Secondary | ICD-10-CM | POA: Insufficient documentation

## 2022-06-15 DIAGNOSIS — M549 Dorsalgia, unspecified: Secondary | ICD-10-CM | POA: Insufficient documentation

## 2022-06-15 DIAGNOSIS — G8929 Other chronic pain: Secondary | ICD-10-CM | POA: Insufficient documentation

## 2022-06-15 DIAGNOSIS — F419 Anxiety disorder, unspecified: Secondary | ICD-10-CM | POA: Insufficient documentation

## 2022-06-15 DIAGNOSIS — E785 Hyperlipidemia, unspecified: Secondary | ICD-10-CM | POA: Insufficient documentation

## 2022-06-15 DIAGNOSIS — E559 Vitamin D deficiency, unspecified: Secondary | ICD-10-CM | POA: Insufficient documentation

## 2022-06-15 DIAGNOSIS — F17210 Nicotine dependence, cigarettes, uncomplicated: Secondary | ICD-10-CM | POA: Insufficient documentation

## 2022-06-15 DIAGNOSIS — K219 Gastro-esophageal reflux disease without esophagitis: Secondary | ICD-10-CM | POA: Insufficient documentation

## 2022-06-15 DIAGNOSIS — N400 Enlarged prostate without lower urinary tract symptoms: Secondary | ICD-10-CM | POA: Insufficient documentation

## 2022-06-15 DIAGNOSIS — R195 Other fecal abnormalities: Secondary | ICD-10-CM | POA: Insufficient documentation

## 2022-06-15 LAB — COMPREHENSIVE METABOLIC PANEL, NON-FASTING
ALBUMIN/GLOBULIN RATIO: 1.7 — ABNORMAL HIGH (ref 0.8–1.4)
ALBUMIN: 4.6 g/dL (ref 3.5–5.7)
ALKALINE PHOSPHATASE: 108 U/L — ABNORMAL HIGH (ref 34–104)
ALT (SGPT): 17 U/L (ref 7–52)
ANION GAP: 6 mmol/L (ref 4–13)
AST (SGOT): 18 U/L (ref 13–39)
BILIRUBIN TOTAL: 0.7 mg/dL (ref 0.3–1.2)
BUN/CREA RATIO: 21 (ref 6–22)
BUN: 20 mg/dL (ref 7–25)
CALCIUM, CORRECTED: 9.1 mg/dL (ref 8.9–10.8)
CALCIUM: 9.6 mg/dL (ref 8.6–10.3)
CHLORIDE: 108 mmol/L — ABNORMAL HIGH (ref 98–107)
CO2 TOTAL: 28 mmol/L (ref 21–31)
CREATININE: 0.97 mg/dL (ref 0.60–1.30)
ESTIMATED GFR: 82 mL/min/{1.73_m2} (ref >59)
GLOBULIN: 2.7 — ABNORMAL LOW (ref 2.9–5.4)
GLUCOSE: 84 mg/dL (ref 74–109)
OSMOLALITY, CALCULATED: 285 mOsm/kg (ref 270–290)
POTASSIUM: 3.9 mmol/L (ref 3.5–5.1)
PROTEIN TOTAL: 7.3 g/dL (ref 6.4–8.9)
SODIUM: 142 mmol/L (ref 136–145)

## 2022-06-15 LAB — CBC WITH DIFF
BASOPHIL #: 0.1 10*3/uL (ref 0.00–0.10)
BASOPHIL %: 1 % (ref 0–1)
EOSINOPHIL #: 0.2 10*3/uL (ref 0.00–0.50)
EOSINOPHIL %: 3 %
HCT: 42.3 % (ref 36.7–47.1)
HGB: 14.2 g/dL (ref 12.5–16.3)
LYMPHOCYTE #: 3.2 10*3/uL — ABNORMAL HIGH (ref 1.00–3.00)
LYMPHOCYTE %: 34 % (ref 16–44)
MCH: 30.6 pg (ref 23.8–33.4)
MCHC: 33.5 g/dL (ref 32.5–36.3)
MCV: 91.2 fL (ref 73.0–96.2)
MONOCYTE #: 0.7 10*3/uL (ref 0.30–1.00)
MONOCYTE %: 8 % (ref 5–13)
MPV: 9.8 fL (ref 7.4–11.4)
NEUTROPHIL #: 5.3 10*3/uL (ref 1.85–7.80)
NEUTROPHIL %: 55 % (ref 43–77)
PLATELETS: 268 10*3/uL (ref 140–440)
RBC: 4.64 10*6/uL (ref 4.06–5.63)
RDW: 15.6 % (ref 12.1–16.2)
WBC: 9.5 10*3/uL (ref 3.6–10.2)

## 2022-06-15 LAB — LIPID PANEL
CHOL/HDL RATIO: 4.8
CHOLESTEROL: 96 mg/dL (ref <200)
HDL CHOL: 20 mg/dL — ABNORMAL LOW (ref 23–92)
LDL CALC: 41 mg/dL (ref 0–100)
TRIGLYCERIDES: 174 mg/dL — ABNORMAL HIGH (ref <=150)
VLDL CALC: 35 mg/dL (ref 0–50)

## 2022-06-15 LAB — VITAMIN D 25 TOTAL: VITAMIN D: 85 ng/mL (ref 30–100)

## 2022-06-15 MED ORDER — AMLODIPINE 5 MG TABLET
5.0000 mg | ORAL_TABLET | Freq: Every day | ORAL | 1 refills | Status: DC
Start: 2022-06-15 — End: 2022-08-28

## 2022-06-15 MED ORDER — SERTRALINE 100 MG TABLET
100.0000 mg | ORAL_TABLET | Freq: Every evening | ORAL | 1 refills | Status: DC
Start: 2022-06-15 — End: 2022-09-14

## 2022-06-15 MED ORDER — ERGOCALCIFEROL (VITAMIN D2) 1,250 MCG (50,000 UNIT) CAPSULE
50000.0000 [IU] | ORAL_CAPSULE | ORAL | 0 refills | Status: DC
Start: 2022-06-15 — End: 2022-08-18

## 2022-06-15 NOTE — Assessment & Plan Note (Signed)
Blood pressure is stable at today's visit.  Check a CMP, and a CBC.  Continue current antihypertensive, amlodipine.

## 2022-06-15 NOTE — Assessment & Plan Note (Signed)
He is scheduled to see Urology in [place] this month.  He will continue to take his current micturition agent, Flomax and Proscar.  PSA in October 2023 was normal.

## 2022-06-15 NOTE — Progress Notes (Signed)
FAMILY MEDICINE, [place]  [place] [address]  Operated by [facility]     Name: Corey [name] MRN:  [id number]   Date of Birth: 01/12/1946 Age: 77 y.o.   Date: 06/15/2022  Time: 15:50     Provider: Illa Level, FNP    Reason for visit: Follow Up 3 Months      History of Present Illness:  Corey [name] is a 77 y.o. male   HTN: He tells me he thinks it doing welling. He does not routinely check his BP at home. He tries to monitor his diet. He plays golf frequently when the weather permits.   CAD- See Dr Marcille Blanco. He denies any recently C.p, SOB, edema. He is going to start exercising at the [facility] tomorrow.   Hyperlipidemia: He taking his cholesterol pills consistently.   GERD:Occasionally,  based on what he eating. He denies any issues with bleeding. No black tarry stools.   Constipation: He tells me he only goes once a week. He drinks a boost High protein every morning. He tells me he doesn't eat well- lacks the desire to eat.   BPH: He urinating as normal. Still having issues with frequent night time urination. He is scheduled to see a urologist in [place] this month.   GAD: He denies any issues.   Low back pain: Uses Tramadol PRN- If he wants a script will need to sign a pain agreement. He has several refills that Dr Kristine Linea gave him, prior to her leaving.   OSA uses a CPAP: He is not using his CPAP consistently. He using about every other night. He tells me he feels tired regardless if he uses it or not.   Vitamin D supplement: He continues to take his supplement consistently.     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    Chronic back pain 12/21/2021    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    Essential hypertension 12/21/2021    Anxiety 12/21/2021    Osteoarthritis 12/21/2021    Vitamin D deficiency 12/21/2021    Benign prostatic hyperplasia 12/21/2021    Renal mass 12/21/2021    Current smoker 12/21/2021    Thyroid nodule 12/21/2021    Hyperlipidemia 08/29/2021    Heart murmur 08/29/2021    Obstructive sleep apnea 08/29/2021    Right carotid bruit 08/29/2021    GERD (gastroesophageal reflux disease) 08/29/2021       Historical Data    Past Medical History:  Past Medical History:   Diagnosis Date    Abnormal laboratory test     Abnormal prostate specific antigen     Anxiety     Arteriosclerotic vascular disease     Atypical chest pain     BPH (benign prostatic hyperplasia)     Coronary artery disease     Depression     Diarrhea     Esophageal reflux     Fatigue     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    [facility]  Dr Cherylynn Ridges    Hypercholesterolemia     Hypertension  IBS (irritable bowel syndrome)     Left foot pain     Leukocytosis     Lumbar disc herniation with radiculopathy     Narcolepsy     Neck pain     OSA (obstructive sleep apnea)     Osteoarthritis     Paresthesia     Previous back surgery     Right carotid bruit     Right hip pain     Thigh pain     Thyroid nodule     Weight loss, non-intentional      Past Surgical History:  Past Surgical History:   Procedure Laterality Date    CARDIAC CATHETERIZATION  07/13/2021    [id number]    Dr Annie Main Ward    DENTAL SURGERY      HX BACK SURGERY      HX COLONOSCOPY  2019    had done at [place]  08/03/2021    Dr Cipriano Bunker    HX HERNIA REPAIR      HX LAP CHOLECYSTECTOMY      ORTHOPEDIC SURGERY       Allergies:  Allergies   Allergen Reactions    Penicillins  Other Adverse Reaction (Add comment)     Childhood allergy, unknown reaction     Medications:  Current Outpatient Medications   Medication Sig    amLODIPine (NORVASC)  5 mg Oral Tablet Take 1 Tablet (5 mg total) by mouth Once a day for 180 days    aspirin (ECOTRIN) 81 mg Oral Tablet, Delayed Release (E.C.) Take 1 Tablet (81 mg total) by mouth Once a day    atorvastatin (LIPITOR) 40 mg Oral Tablet Take 1 Tablet (40 mg total) by mouth Once a day    clopidogreL (PLAVIX) 75 mg Oral Tablet Take 1 Tablet (75 mg total) by mouth Once a day    ergocalciferol, vitamin D2, (DRISDOL) 1,250 mcg (50,000 unit) Oral Capsule Take 1 Capsule (50,000 Units total) by mouth Every 7 days for 90 days    famotidine (PEPCID) 40 mg Oral Tablet Take 1 Tablet (40 mg total) by mouth Twice daily    finasteride (PROSCAR) 5 mg Oral Tablet Take 1 Tablet (5 mg total) by mouth Once a day    metoprolol succinate (TOPROL-XL) 25 mg Oral Tablet Sustained Release 24 hr Take 1 Tablet (25 mg total) by mouth Once a day    nitroGLYCERIN (NITROSTAT) 0.4 mg Sublingual Tablet, Sublingual DISSOLVE 1 TAB UNDER TONGUE EVERY 5 MINUTES AS NEEDED FOR CHEST PAIN. DO NOT EXCEED 3 DOSES IN 15 MINUTES.    sertraline (ZOLOFT) 100 mg Oral Tablet Take 1 Tablet (100 mg total) by mouth Every night for 180 days Indications: repeated episodes of anxiety    tamsulosin (FLOMAX) 0.4 mg Oral Capsule Take 1 Capsule (0.4 mg total) by mouth Once a day    traMADoL (ULTRAM) 50 mg Oral Tablet Take 1 Tablet (50 mg total) by mouth Twice per day as needed for Pain for up to 180 days     Family History:  Family Medical History:       Problem Relation (Age of Onset)    Brain cancer Father    Heart Disease Mother    Hypertension (High Blood Pressure) Mother            Social History:  Social History     Socioeconomic History    Marital status: Married     Spouse name: Bonnita Nasuti  Number of children: 2    Years of education: 12+    Highest education level: Bachelor's degree (e.g., BA, AB, BS)   Tobacco Use    Smoking status: Some Days     Packs/day: 1.00     Years: 54.00     Additional pack years: 0.00     Total pack years: 54.00     Types: Cigarettes     Start  date: 1969    Smokeless tobacco: Never   Vaping Use    Vaping Use: Never used   Substance and Sexual Activity    Alcohol use: Yes     Alcohol/week: 3.0 standard drinks of alcohol     Types: 3 Cans of beer per week     Comment: occasionally    Drug use: Never     Social Determinants of Health     Financial Resource Strain: Low Risk  (06/15/2022)    Financial Resource Strain     SDOH Financial: No   Transportation Needs: Low Risk  (06/15/2022)    Transportation Needs     SDOH Transportation: No   Social Connections: Low Risk  (06/15/2022)    Social Connections     SDOH Social Isolation: 5 or more times a week   Intimate Partner Violence: Low Risk  (06/15/2022)    Intimate Partner Violence     SDOH Domestic Violence: No   Housing Stability: Low Risk  (06/15/2022)    Housing Stability     SDOH Housing Situation: I have housing.     SDOH Housing Worry: No           Review of Systems:  Any pertinent Review of Systems as addressed in the HPI above.    Physical Exam:  Vital Signs:  Vitals:    06/15/22 0804   BP: 127/77   Pulse: 63   Resp: 18   Temp: 36.4 C (97.5 F)   SpO2: 96%   Weight: 69.7 kg (153 lb 9.3 oz)   Height: 1.676 m ('5\' 6"'$ )   BMI: 24.84     Physical Exam  Vitals reviewed.   Constitutional:       Appearance: Normal appearance. He is well-developed, well-groomed and normal weight.   HENT:      Head: Normocephalic and atraumatic.      Comments: Hearing loss is mild appear, by conversation.      Right Ear: Decreased hearing noted.      Left Ear: Decreased hearing noted.      Nose: Nose normal.      Mouth/Throat:      Lips: Pink.      Mouth: Mucous membranes are moist.      Pharynx: Oropharynx is clear.   Eyes:      Conjunctiva/sclera: Conjunctivae normal.   Cardiovascular:      Rate and Rhythm: Normal rate and regular rhythm.      Pulses: Normal pulses.      Heart sounds: S1 normal and S2 normal. Murmur (Grade 3) heard.   Pulmonary:      Effort: Pulmonary effort is normal.      Breath sounds: Normal breath sounds and air  entry. No wheezing, rhonchi or rales.   Abdominal:      General: Abdomen is flat. Bowel sounds are normal. There is no distension.      Palpations: Abdomen is soft. There is no mass.      Tenderness: There is no abdominal tenderness.   Musculoskeletal:  Right lower leg: No edema.      Left lower leg: No edema.   Skin:     General: Skin is warm.      Coloration: Skin is not cyanotic or pale.      Nails: There is no clubbing.   Neurological:      Mental Status: He is alert and oriented to person, place, and time. Mental status is at baseline.   Psychiatric:         Attention and Perception: Attention normal.         Mood and Affect: Mood and affect normal.         Speech: Speech normal.         Behavior: Behavior normal. Behavior is cooperative.          Assessment/Plan:  (I25.708) Coronary artery disease of bypass graft of native heart with stable angina pectoris (CMS HCC)  (primary encounter diagnosis)    (I10) Essential hypertension  Plan: CBC/DIFF, COMPREHENSIVE METABOLIC PANEL,         NON-FASTING    (E78.5) Hyperlipidemia, unspecified hyperlipidemia type  Plan: CBC/DIFF, LIPID PANEL    (G47.33) Obstructive sleep apnea    (K21.9) Gastroesophageal reflux disease, unspecified whether esophagitis present  Plan: CBC/DIFF, COMPREHENSIVE METABOLIC PANEL,         NON-FASTING    (E55.9) Vitamin D deficiency  Plan: VITAMIN D 25 TOTAL    (N40.0) Benign prostatic hyperplasia, unspecified whether lower urinary tract symptoms present    (F41.9) Anxiety    (M54.9,  G89.29) Chronic back pain    (R19.5) Positive colorectal cancer screening using Cologuard test     Problem List Items Addressed This Visit          Cardiovascular System    Hyperlipidemia    Relevant Orders    CBC/DIFF    LIPID PANEL    Essential hypertension     Blood pressure is stable at today's visit.  Check a CMP, and a CBC.  Continue current antihypertensive, amlodipine.         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 Marcille Blanco.   Patient denies any current issues with chest pain shortness for breath or edema.  We will check a CBC, CMP.    Therapies unchanged and he will remain on metoprolol, Plavix, aspirin, and nitroglycerin sublingually as needed            Respiratory    Obstructive sleep apnea     Continue to encourage him to use his CPAP nightly.  We will continue to monitor use.             Digestive    GERD (gastroesophageal reflux disease)     Controlled on his current H2 blocker, famotidine.  Check a CBC.  Continue therapy and monitor         Relevant Orders    CBC/DIFF    COMPREHENSIVE METABOLIC PANEL, NON-FASTING       Endocrine    Vitamin D deficiency     Vitamin-D level was on the high side of normal in September.  Recheck a vitamin-D level today and continue his current supplement based on the results         Relevant Orders    VITAMIN D 25 TOTAL  Musculoskeletal    Chronic back pain     Is managed well with tramadol as needed.  Encouraged patient to stay active as tolerated            Urology    Benign prostatic hyperplasia     He is scheduled to see Urology in [place] this month.  He will continue to take his current micturition agent, Flomax and Proscar.  PSA in October 2023 was normal.              Psychiatric    Anxiety     Is stable on his current SSRI, sertraline.  Check a CMP and continue Zoloft.             Other    Positive colorectal cancer screening using Cologuard test     He continues to decline colonoscopy          Orders Placed This Encounter    CBC/DIFF    COMPREHENSIVE METABOLIC PANEL, NON-FASTING    LIPID PANEL    VITAMIN D 25 TOTAL    CBC WITH DIFF    amLODIPine (NORVASC) 5 mg Oral Tablet    sertraline (ZOLOFT) 100 mg Oral Tablet    ergocalciferol, vitamin D2, (DRISDOL) 1,250 mcg (50,000 unit) Oral Capsule         No follow-ups on file.    Corey Muma L  Cleto Claggett, FNP     Portions of this note may be dictated using voice recognition software or a dictation service. Variances in spelling and vocabulary are possible and unintentional. Not all errors are caught/corrected. Please notify the [facility] if any discrepancies are noted or if the meaning of any statement is not clear.

## 2022-06-15 NOTE — Assessment & Plan Note (Signed)
Is stable on his current SSRI, sertraline.  Check a CMP and continue Zoloft.

## 2022-06-15 NOTE — Assessment & Plan Note (Signed)
Is managed well with tramadol as needed.  Encouraged patient to stay active as tolerated

## 2022-06-15 NOTE — Assessment & Plan Note (Signed)
Controlled on his current H2 blocker, famotidine.  Check a CBC.  Continue therapy and monitor

## 2022-06-15 NOTE — Assessment & Plan Note (Signed)
Is mainly managed through Cardiology, Dr. Marcille Blanco.   Patient denies any current issues with chest pain shortness for breath or edema.  We will check a CBC, CMP.    Therapies unchanged and he will remain on metoprolol, Plavix, aspirin, and nitroglycerin sublingually as needed

## 2022-06-15 NOTE — Assessment & Plan Note (Signed)
He continues to decline colonoscopy

## 2022-06-15 NOTE — Assessment & Plan Note (Signed)
Continue to encourage him to use his CPAP nightly.  We will continue to monitor use.

## 2022-06-15 NOTE — Result Encounter Note (Signed)
Reviewed with patient at his appt on 06/15/2022

## 2022-06-15 NOTE — Assessment & Plan Note (Signed)
Vitamin-D level was on the high side of normal in September.  Recheck a vitamin-D level today and continue his current supplement based on the results

## 2022-06-21 DIAGNOSIS — N401 Enlarged prostate with lower urinary tract symptoms: Secondary | ICD-10-CM | POA: Insufficient documentation

## 2022-06-21 DIAGNOSIS — F411 Generalized anxiety disorder: Secondary | ICD-10-CM | POA: Insufficient documentation

## 2022-06-21 DIAGNOSIS — C4491 Basal cell carcinoma of skin, unspecified: Secondary | ICD-10-CM | POA: Insufficient documentation

## 2022-07-14 NOTE — Result Encounter Note (Signed)
Comprehensive metabolic panel:  Stable no indications of electrolyte abnormalities, no indications liver or kidney dysfunction.  Lipid panel:  Total cholesterol normal triglyceride slightly elevated but improved from 8 months ago, HDLs are stable but low at 20.  Continue statin therapy no changes and monitor.  CBC: No indications of anemia, infection, or inflammation.  Vitamin-D: Stable at 85 this is a little elevated, I prefer vitamin-D 2 between 30 and 50.  May consider reducing his supplement to every other week at next visit.

## 2022-07-18 ENCOUNTER — Ambulatory Visit (RURAL_HEALTH_CENTER): Payer: Self-pay | Admitting: Family

## 2022-08-18 ENCOUNTER — Other Ambulatory Visit (RURAL_HEALTH_CENTER): Payer: Self-pay | Admitting: Family

## 2022-08-18 MED ORDER — ERGOCALCIFEROL (VITAMIN D2) 1,250 MCG (50,000 UNIT) CAPSULE
50000.0000 [IU] | ORAL_CAPSULE | ORAL | 0 refills | Status: DC
Start: 2022-08-18 — End: 2022-09-14

## 2022-08-28 ENCOUNTER — Other Ambulatory Visit (RURAL_HEALTH_CENTER): Payer: Self-pay | Admitting: Family

## 2022-08-30 MED ORDER — AMLODIPINE 5 MG TABLET
5.0000 mg | ORAL_TABLET | Freq: Every day | ORAL | 1 refills | Status: DC
Start: 2022-08-30 — End: 2022-12-15

## 2022-09-01 ENCOUNTER — Ambulatory Visit (INDEPENDENT_AMBULATORY_CARE_PROVIDER_SITE_OTHER): Payer: Self-pay | Admitting: Student in an Organized Health Care Education/Training Program

## 2022-09-01 ENCOUNTER — Encounter (HOSPITAL_COMMUNITY): Payer: Self-pay

## 2022-09-14 ENCOUNTER — Encounter (RURAL_HEALTH_CENTER): Payer: Self-pay | Admitting: Family

## 2022-09-14 ENCOUNTER — Other Ambulatory Visit: Payer: Self-pay

## 2022-09-14 ENCOUNTER — Ambulatory Visit (RURAL_HEALTH_CENTER): Payer: Medicare PPO | Attending: Family | Admitting: Family

## 2022-09-14 VITALS — BP 121/57 | HR 56 | Temp 98.0°F | Resp 18 | Ht 67.0 in | Wt 158.4 lb

## 2022-09-14 DIAGNOSIS — G8929 Other chronic pain: Secondary | ICD-10-CM | POA: Insufficient documentation

## 2022-09-14 DIAGNOSIS — M549 Dorsalgia, unspecified: Secondary | ICD-10-CM | POA: Insufficient documentation

## 2022-09-14 DIAGNOSIS — E782 Mixed hyperlipidemia: Secondary | ICD-10-CM | POA: Insufficient documentation

## 2022-09-14 DIAGNOSIS — R195 Other fecal abnormalities: Secondary | ICD-10-CM | POA: Insufficient documentation

## 2022-09-14 DIAGNOSIS — G4733 Obstructive sleep apnea (adult) (pediatric): Secondary | ICD-10-CM | POA: Insufficient documentation

## 2022-09-14 DIAGNOSIS — I10 Essential (primary) hypertension: Secondary | ICD-10-CM | POA: Insufficient documentation

## 2022-09-14 DIAGNOSIS — N400 Enlarged prostate without lower urinary tract symptoms: Secondary | ICD-10-CM | POA: Insufficient documentation

## 2022-09-14 DIAGNOSIS — M545 Low back pain, unspecified: Secondary | ICD-10-CM | POA: Insufficient documentation

## 2022-09-14 DIAGNOSIS — F17210 Nicotine dependence, cigarettes, uncomplicated: Secondary | ICD-10-CM | POA: Insufficient documentation

## 2022-09-14 DIAGNOSIS — E559 Vitamin D deficiency, unspecified: Secondary | ICD-10-CM | POA: Insufficient documentation

## 2022-09-14 DIAGNOSIS — I2510 Atherosclerotic heart disease of native coronary artery without angina pectoris: Secondary | ICD-10-CM | POA: Insufficient documentation

## 2022-09-14 DIAGNOSIS — K219 Gastro-esophageal reflux disease without esophagitis: Secondary | ICD-10-CM | POA: Insufficient documentation

## 2022-09-14 DIAGNOSIS — F411 Generalized anxiety disorder: Secondary | ICD-10-CM | POA: Insufficient documentation

## 2022-09-14 DIAGNOSIS — E785 Hyperlipidemia, unspecified: Secondary | ICD-10-CM | POA: Insufficient documentation

## 2022-09-14 NOTE — Assessment & Plan Note (Signed)
Well controlled with current SSRI. Continue Sertraline (Zoloft) and monitor response.   Check a CMP

## 2022-09-14 NOTE — Assessment & Plan Note (Signed)
Followed up with Urologist is [place].   Continue Tamulosin 0.4mg . Refill sent to pharmacy.

## 2022-09-14 NOTE — Assessment & Plan Note (Signed)
Primarily managed by Cardiology. Will request most recent clinical note Dr. Alethia Berthold.

## 2022-09-14 NOTE — Progress Notes (Signed)
FAMILY MEDICINE, [facility]  [address]  [place] [address]  Operated by [facility]     Name: Corey [name] MRN:  [id number]   Date of Birth: [birth year] Age: 77 y.o.   Date: 09/14/2022  Time: 17:24     Provider: Asa Lente, FNP    Reason for visit: Follow Up (3 month follow-up and medication refills.)      History of Present Illness:  Corey [name] is a 77 y.o. male presenting for 3 month chronic disease management.   PYP:[id number] his BP at home. No issues. Denies any C.p, Swelling or unexplained h.a.    CAD: Follows Dr. Alethia Berthold. Dr. Alethia Berthold recently discontinued Plavix. Notes SOB with exertion, but nothing increased Denies CP and edema. Routinely plays golf and exercises at the [facility] (three days per week). He going to continue the ASA therapy. He tells me Dr Alethia Berthold told him that he would fix his valve when he was 87.   Hyperlipidemia: Controlled with Atorvastatin 40 mg.  Denies any stroke like symptoms.   GERD: Followed up with Dr. Concha Se recently for a  EGD (08/29/22). Findings showed: Mild distal esophagitis, grade 1, small hiatal hernia, moderate gastritis, moderate duodenitis. Changed medications to Famotidine 40 mg PO QAM and Omeprazole 40 mg PO BID.  Recommended repeat EGD in 3 years.  Denies any current issues with indigestion or heart burn.   BPH: Followed up with Urology in [place], who diagnosed patient with enlarged prostate. Plan to perform a "procedure" on prostate when "equipment comes in." Controlled with Tamulosin 0.14mg . Stills has issues with frequent night time urination depending upon the day.   GAD: Controlled with Sertraline. No concerns today. Notes still occasionally grinds teeth at night.   Low back pain: Uses Tramadol PRN. Needs prescription. Is willing to sign a controlled substance contract   OSA uses a CPAP: States that he uses his CPAP machine most nights.   History of Vitamin D insuf: Takes his Vitamin D  supplement once a week.     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    Chronic back pain 12/21/2021    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    Essential hypertension 12/21/2021    Anxiety 12/21/2021    Osteoarthritis 12/21/2021    Vitamin D deficiency 12/21/2021    Benign prostatic hyperplasia 12/21/2021    Renal mass 12/21/2021    Current smoker 12/21/2021    Thyroid nodule 12/21/2021    Hyperlipidemia 08/29/2021    Heart murmur 08/29/2021    Obstructive sleep apnea 08/29/2021    Right carotid bruit 08/29/2021    GERD (gastroesophageal reflux disease) 08/29/2021       Historical Data    Past Medical History:  Past Medical History:   Diagnosis Date    Abnormal laboratory test     Abnormal prostate specific antigen     Anxiety  Arteriosclerotic vascular disease     Atypical chest pain     BPH (benign prostatic hyperplasia)     Coronary artery disease     Depression     Diarrhea     Esophageal reflux     Fatigue     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    [place]  Dr Aleen Campi    Hypercholesterolemia     Hypertension     IBS (irritable bowel syndrome)     Left foot pain     Leukocytosis     Lumbar disc herniation with radiculopathy     Narcolepsy     Neck pain     OSA (obstructive sleep apnea)     Osteoarthritis     Paresthesia     Previous back surgery     Right carotid bruit     Right hip pain     Thigh pain     Thyroid nodule     Weight loss, non-intentional      Past Surgical  History:  Past Surgical History:   Procedure Laterality Date    CARDIAC CATHETERIZATION  07/13/2021    [id number]    Dr [facility]    DENTAL SURGERY      HX BACK SURGERY      HX COLONOSCOPY  2019    had done at [facility]    HX CORONARY ARTERY BYPASS GRAFT  08/03/2021    Dr Ihor [place]    HX HERNIA REPAIR      HX LAP CHOLECYSTECTOMY      ORTHOPEDIC SURGERY       Allergies:  Allergies   Allergen Reactions    Penicillins  Other Adverse Reaction (Add comment)     Childhood allergy, unknown reaction     Medications:  Current Outpatient Medications   Medication Sig    amLODIPine (NORVASC) 5 mg Oral Tablet Take 1 Tablet (5 mg total) by mouth Once a day for 180 days    aspirin (ECOTRIN) 81 mg Oral Tablet, Delayed Release (E.C.) Take 1 Tablet (81 mg total) by mouth Once a day    atorvastatin (LIPITOR) 40 mg Oral Tablet Take 1 Tablet (40 mg total) by mouth Once a day for 180 days    cholecalciferol, vitamin D3, 1,250 mcg (50,000 unit) Oral Capsule Take 1 Capsule (50,000 Units total) by mouth Every 14 days for 180 days Indications: low vitamin D levels, prevention of vitamin D deficiency    famotidine (PEPCID) 40 mg Oral Tablet Take 1 Tablet (40 mg total) by mouth Twice daily    finasteride (PROSCAR) 5 mg Oral Tablet Take 1 Tablet (5 mg total) by mouth Once a day    metoprolol succinate (TOPROL-XL) 25 mg Oral Tablet Sustained Release 24 hr Take 1 Tablet (25 mg total) by mouth Once a day    nitroGLYCERIN (NITROSTAT) 0.4 mg Sublingual Tablet, Sublingual DISSOLVE 1 TAB UNDER TONGUE EVERY 5 MINUTES AS NEEDED FOR CHEST PAIN. DO NOT EXCEED 3 DOSES IN 15 MINUTES.    sertraline (ZOLOFT) 100 mg Oral Tablet Take 1 Tablet (100 mg total) by mouth Every night for 180 days Indications: repeated episodes of anxiety    tamsulosin (FLOMAX) 0.4 mg Oral Capsule Take 1 Capsule (0.4 mg total) by mouth Every evening after dinner for 90 days Indications: enlarged prostate with urination problem    traMADoL (ULTRAM) 50 mg Oral Tablet Take 1 Tablet (50 mg  total) by mouth Twice per day as needed for Pain for up  to 30 days Indications: pain     Family History:  Family Medical History:       Problem Relation (Age of Onset)    Brain cancer Father    Heart Disease Mother    Hypertension (High Blood Pressure) Mother            Social History:  Social History     Socioeconomic History    Marital status: Married     Spouse name: Myriam JacobsonHelen    Number of children: 2    Years of education: 12+    Highest education level: Bachelor's degree (e.g., BA, AB, BS)   Tobacco Use    Smoking status: Every Day     Current packs/day: 1.00     Average packs/day: 1 pack/day for 55.3 years (55.3 ttl pk-yrs)     Types: Cigarettes     Start date: 1969    Smokeless tobacco: Never    Tobacco comments:     Counseled by A.Sandrea Boer NP 03/15/22   Vaping Use    Vaping status: Never Used   Substance and Sexual Activity    Alcohol use: Yes     Alcohol/week: 3.0 standard drinks of alcohol     Types: 3 Cans of beer per week     Comment: occasionally    Drug use: Never     Social Determinants of Health     Financial Resource Strain: Low Risk  (06/15/2022)    Financial Resource Strain     SDOH Financial: No   Transportation Needs: Low Risk  (06/15/2022)    Transportation Needs     SDOH Transportation: No   Social Connections: Low Risk  (06/15/2022)    Social Connections     SDOH Social Isolation: 5 or more times a week   Intimate Partner Violence: Low Risk  (06/15/2022)    Intimate Partner Violence     SDOH Domestic Violence: No   Housing Stability: Low Risk  (06/15/2022)    Housing Stability     SDOH Housing Situation: I have housing.     SDOH Housing Worry: No           Review of Systems:  Any pertinent Review of Systems as addressed in the HPI above.    Physical Exam:  Vital Signs:  Vitals:    09/14/22 0844   BP: (!) 121/57   Pulse: 56   Resp: 18   Temp: 36.7 C (98 F)   TempSrc: Temporal   SpO2: 97%   Weight: 71.8 kg (158 lb 6 oz)   Height: 1.702 m (5\' 7" )   BMI: 24.86     Physical Exam  Constitutional:        Appearance: Normal appearance. He is well-developed, well-groomed and normal weight. He is not ill-appearing.   HENT:      Head: Normocephalic.      Right Ear: Tympanic membrane, ear canal and external ear normal.      Left Ear: Tympanic membrane, ear canal and external ear normal.      Nose: Nose normal.      Mouth/Throat:      Mouth: Mucous membranes are moist.   Eyes:      Pupils: Pupils are equal, round, and reactive to light.   Cardiovascular:      Rate and Rhythm: Normal rate and regular rhythm.      Heart sounds: S1 normal and S2 normal. Murmur heard.      Systolic murmur is present with a grade  of 3/6.      Comments: Ascending descending murmur heart best at RUSB.   Pulmonary:      Effort: No respiratory distress.      Breath sounds: Normal breath sounds. No wheezing, rhonchi or rales.   Abdominal:      General: Bowel sounds are normal.      Palpations: Abdomen is soft.   Musculoskeletal:      Cervical back: Neck supple.      Right lower leg: No edema.      Left lower leg: No edema.   Neurological:      Mental Status: He is alert and oriented to person, place, and time. Mental status is at baseline.   Psychiatric:         Mood and Affect: Mood and affect normal.         Behavior: Behavior normal. Behavior is cooperative.          Assessment/Plan:  (E78.5) Hyperlipidemia, unspecified hyperlipidemia type  (primary encounter diagnosis)  Plan: COMPREHENSIVE METABOLIC PANEL, NON-FASTING    (I25.10) Coronary artery disease, unspecified vessel or lesion type, unspecified whether angina present, unspecified whether native or transplanted heart    (I10) Essential hypertension  Plan: CBC/DIFF, COMPREHENSIVE METABOLIC PANEL,         NON-FASTING    (N40.0) Benign prostatic hyperplasia, unspecified whether lower urinary tract symptoms present    (K21.9) Gastroesophageal reflux disease, unspecified whether esophagitis present  Plan: CBC/DIFF    (F41.1) GAD (generalized anxiety disorder)    (G47.33) Obstructive sleep  apnea  Plan: CBC/DIFF    (M54.9,  G89.29) Chronic back pain    (R19.5) Positive colorectal cancer screening using Cologuard test  Plan: CBC/DIFF    (E55.9) Vitamin D deficiency  Plan: VITAMIN D 25 TOTAL    (E78.2) Mixed hyperlipidemia  Plan: LIPID PANEL     Problem List Items Addressed This Visit          Cardiovascular System    Hyperlipidemia - Primary     Check a lipid level Continue current statin therapy, Atorvastatin 40 mg.  Discussed the importance of a healthy diet, low in fats.           Relevant Orders    LIPID PANEL    COMPREHENSIVE METABOLIC PANEL, NON-FASTING    Essential hypertension     BP is well controlled with medications.   Check a CBC and a CMP.  He plans to continue his antihypertensive,  amlodipine         Relevant Orders    CBC/DIFF    COMPREHENSIVE METABOLIC PANEL, NON-FASTING    Coronary artery disease     Primarily managed by Cardiology. Will request most recent clinical note Dr. Alethia Berthold.             Respiratory    Obstructive sleep apnea     Well controlled with the use of his CPAP machine.   Continue to monitor         Relevant Orders    CBC/DIFF       Digestive    GERD (gastroesophageal reflux disease)     Followed up with Dr. Concha Se.   EGD (08/29/22) showed:mild distal esophagitis, grade 1, small hiatal hernia, moderate gastritis, moderate duodenitis   Continue Famotidine 40 mg PO QAM.   Continue Omeprazole 40 mg PO BID.   Patient to follow up in three years with Dr. Allena Katz.          Relevant Orders  CBC/DIFF       Endocrine    Vitamin D deficiency     Check a vitamin-D level and changed dose to Vitamin D 50,000 IU every other week.          Relevant Orders    VITAMIN D 25 TOTAL       Musculoskeletal    Chronic back pain     Pain contract discussed and filled out with patient today in office.   Prescribed Tramadol 40 mg to use as needed for chronic low back pain.             Urology    Benign prostatic hyperplasia     [place]   Continue Tamulosin 0.4mg .  Refill sent to pharmacy.             Psychiatric    GAD (generalized anxiety disorder)     Well controlled with current SSRI. Continue Sertraline (Zoloft) and monitor response.   Check a CMP            Other    Positive colorectal cancer screening using Cologuard test     Was unaware of his upcoming and recent EGD. Expressed my concern with patient that he did not have the colonoscopy performed at the same time given his recent positive Cologuard.  Again, I Encouraged patient to follow up with Dr. Concha SeK Patel for colonoscopy.         Relevant Orders    CBC/DIFF      Orders Placed This Encounter    CBC/DIFF    COMPREHENSIVE METABOLIC PANEL, NON-FASTING    LIPID PANEL    VITAMIN D 25 TOTAL    tamsulosin (FLOMAX) 0.4 mg Oral Capsule    traMADoL (ULTRAM) 50 mg Oral Tablet    cholecalciferol, vitamin D3, 1,250 mcg (50,000 unit) Oral Capsule    atorvastatin (LIPITOR) 40 mg Oral Tablet    sertraline (ZOLOFT) 100 mg Oral Tablet      Depression screening is negative. PHQ 2 Total: 0     Return in 3 months (on 12/14/2022) for Chronic Disease Management.  Patient also needs to be scheduled for a Medicare wellness.    Chucky MayKymbre Robinson, MED STUDENT OMS-III    I saw and examined the patient.  I reviewed the Med Student's note.  I agree with the findings and plan of care as documented in the Med Student's note.  Any exceptions/additions are edited/noted.    Mansour Balboa L Mollee Neer, FNP-C    Portions of this note may be dictated using voice recognition software or a dictation service. Variances in spelling and vocabulary are possible and unintentional. Not all errors are caught/corrected. Please notify the Thereasa Parkinauthor if any discrepancies are noted or if the meaning of any statement is not clear.

## 2022-09-14 NOTE — Assessment & Plan Note (Signed)
Continue with Atorvastatin 40 mg.

## 2022-09-14 NOTE — Assessment & Plan Note (Signed)
Was unaware of his upcoming and recent EGD. Expressed my concern with patient that he did not have the colonoscopy performed at the same time given his recent positive Cologuard.  Again, I Encouraged patient to follow up with Dr. Concha Se for colonoscopy.

## 2022-09-14 NOTE — Nursing Note (Signed)
Patient here today for 3 month follow-up and medication refills.

## 2022-09-14 NOTE — Assessment & Plan Note (Signed)
Well controlled with the use of his CPAP machine.   Continue to monitor

## 2022-09-14 NOTE — Assessment & Plan Note (Signed)
Pain contract filled out with patient today in office.   Prescribed Tramadol 40 mg to use as needed for chronic low back pain.

## 2022-09-14 NOTE — Assessment & Plan Note (Signed)
Check a vitamin-D level and changed dose to Vitamin D 50,000 IU every other week.

## 2022-09-14 NOTE — Assessment & Plan Note (Signed)
Followed up with Dr. Concha Se.   EGD (08/29/22) showed:mild distal esophagitis, grade 1, small hiatal hernia, moderate gastritis, moderate duodenitis   Continue Famotidine 40 mg PO QAM.   Continue Omeprazole 40 mg PO BID.   Patient to follow up in three years with Dr. Allena Katz.

## 2022-09-14 NOTE — Assessment & Plan Note (Signed)
BP is well controlled with medications.   Check a CBC and a CMP.  He plans to continue his antihypertensive,  amlodipine

## 2022-09-15 ENCOUNTER — Encounter (RURAL_HEALTH_CENTER): Payer: Self-pay | Admitting: Family

## 2022-09-15 MED ORDER — ATORVASTATIN 40 MG TABLET
40.0000 mg | ORAL_TABLET | Freq: Every day | ORAL | 1 refills | Status: DC
Start: 2022-09-15 — End: 2022-12-15

## 2022-09-15 MED ORDER — TAMSULOSIN 0.4 MG CAPSULE
0.4000 mg | ORAL_CAPSULE | Freq: Every evening | ORAL | 0 refills | Status: DC
Start: 2022-09-15 — End: 2022-12-15

## 2022-09-15 MED ORDER — TRAMADOL 50 MG TABLET
1.0000 | ORAL_TABLET | Freq: Two times a day (BID) | ORAL | 0 refills | Status: AC | PRN
Start: 2022-09-15 — End: 2022-10-15

## 2022-09-15 MED ORDER — SERTRALINE 100 MG TABLET
100.0000 mg | ORAL_TABLET | Freq: Every evening | ORAL | 1 refills | Status: DC
Start: 2022-09-15 — End: 2022-12-15

## 2022-09-15 MED ORDER — CHOLECALCIFEROL (VITAMIN D3) 1,250 MCG (50,000 UNIT) CAPSULE
50000.0000 [IU] | ORAL_CAPSULE | ORAL | 1 refills | Status: DC
Start: 2022-09-15 — End: 2022-12-15

## 2022-09-18 ENCOUNTER — Other Ambulatory Visit (RURAL_HEALTH_CENTER): Payer: Self-pay | Admitting: Family

## 2022-09-18 MED ORDER — FINASTERIDE 5 MG TABLET
5.0000 mg | ORAL_TABLET | Freq: Every day | ORAL | 1 refills | Status: DC
Start: 2022-09-18 — End: 2022-12-15

## 2022-09-18 NOTE — Progress Notes (Signed)
Refill request

## 2022-09-19 ENCOUNTER — Other Ambulatory Visit (RURAL_HEALTH_CENTER): Payer: Self-pay | Admitting: Family

## 2022-09-19 MED ORDER — METOPROLOL SUCCINATE ER 25 MG TABLET,EXTENDED RELEASE 24 HR
25.0000 mg | ORAL_TABLET | Freq: Every day | ORAL | 0 refills | Status: DC
Start: 2022-09-19 — End: 2022-10-23

## 2022-10-11 HISTORY — PX: PROSTATE SURGERY: SHX751

## 2022-10-23 ENCOUNTER — Ambulatory Visit (RURAL_HEALTH_CENTER): Payer: Medicare PPO | Attending: Family

## 2022-10-23 ENCOUNTER — Other Ambulatory Visit (RURAL_HEALTH_CENTER): Payer: Self-pay | Admitting: Family

## 2022-10-23 ENCOUNTER — Ambulatory Visit: Payer: Medicare PPO | Attending: Family | Admitting: Family

## 2022-10-23 ENCOUNTER — Other Ambulatory Visit: Payer: Self-pay

## 2022-10-23 DIAGNOSIS — E782 Mixed hyperlipidemia: Secondary | ICD-10-CM | POA: Insufficient documentation

## 2022-10-23 DIAGNOSIS — E785 Hyperlipidemia, unspecified: Secondary | ICD-10-CM | POA: Insufficient documentation

## 2022-10-23 DIAGNOSIS — E559 Vitamin D deficiency, unspecified: Secondary | ICD-10-CM | POA: Insufficient documentation

## 2022-10-23 DIAGNOSIS — I10 Essential (primary) hypertension: Secondary | ICD-10-CM | POA: Insufficient documentation

## 2022-10-23 DIAGNOSIS — G4733 Obstructive sleep apnea (adult) (pediatric): Secondary | ICD-10-CM | POA: Insufficient documentation

## 2022-10-23 DIAGNOSIS — R195 Other fecal abnormalities: Secondary | ICD-10-CM | POA: Insufficient documentation

## 2022-10-23 DIAGNOSIS — K219 Gastro-esophageal reflux disease without esophagitis: Secondary | ICD-10-CM | POA: Insufficient documentation

## 2022-10-24 LAB — COMPREHENSIVE METABOLIC PANEL, NON-FASTING
ALBUMIN/GLOBULIN RATIO: 1.8 — ABNORMAL HIGH (ref 0.8–1.4)
ALBUMIN: 4.4 g/dL (ref 3.5–5.7)
ALKALINE PHOSPHATASE: 87 U/L (ref 34–104)
ALT (SGPT): 15 U/L (ref 7–52)
ANION GAP: 8 mmol/L (ref 4–13)
AST (SGOT): 18 U/L (ref 13–39)
BILIRUBIN TOTAL: 0.4 mg/dL (ref 0.3–1.2)
BUN/CREA RATIO: 18 (ref 6–22)
BUN: 19 mg/dL (ref 7–25)
CALCIUM, CORRECTED: 9 mg/dL (ref 8.9–10.8)
CALCIUM: 9.3 mg/dL (ref 8.6–10.3)
CHLORIDE: 108 mmol/L — ABNORMAL HIGH (ref 98–107)
CO2 TOTAL: 25 mmol/L (ref 21–31)
CREATININE: 1.03 mg/dL (ref 0.60–1.30)
ESTIMATED GFR: 76 mL/min/{1.73_m2} (ref >59)
GLOBULIN: 2.4 — ABNORMAL LOW (ref 2.9–5.4)
GLUCOSE: 75 mg/dL (ref 74–109)
OSMOLALITY, CALCULATED: 282 mOsm/kg (ref 270–290)
POTASSIUM: 3.8 mmol/L (ref 3.5–5.1)
PROTEIN TOTAL: 6.8 g/dL (ref 6.4–8.9)
SODIUM: 141 mmol/L (ref 136–145)

## 2022-10-24 LAB — CBC WITH DIFF
BASOPHIL #: 0.1 10*3/uL (ref 0.00–0.10)
BASOPHIL %: 0 % (ref 0–1)
EOSINOPHIL #: 0.1 10*3/uL (ref 0.00–0.50)
EOSINOPHIL %: 0 % — ABNORMAL LOW
HCT: 39.1 % (ref 36.7–47.1)
HGB: 13.3 g/dL (ref 12.5–16.3)
LYMPHOCYTE #: 2.5 10*3/uL (ref 1.00–3.00)
LYMPHOCYTE %: 20 % (ref 16–44)
MCH: 31.2 pg (ref 23.8–33.4)
MCHC: 34 g/dL (ref 32.5–36.3)
MCV: 91.7 fL (ref 73.0–96.2)
MONOCYTE #: 0.8 10*3/uL (ref 0.30–1.00)
MONOCYTE %: 6 % (ref 5–13)
MPV: 10.3 fL (ref 7.4–11.4)
NEUTROPHIL #: 9.3 10*3/uL — ABNORMAL HIGH (ref 1.85–7.80)
NEUTROPHIL %: 74 % (ref 43–77)
PLATELETS: 223 10*3/uL (ref 140–440)
RBC: 4.26 10*6/uL (ref 4.06–5.63)
RDW: 13.5 % (ref 12.1–16.2)
WBC: 12.6 10*3/uL — ABNORMAL HIGH (ref 3.6–10.2)

## 2022-10-24 LAB — VITAMIN D 25 TOTAL: VITAMIN D: 54 ng/mL (ref 30–100)

## 2022-10-24 LAB — LIPID PANEL
CHOL/HDL RATIO: 5
CHOLESTEROL: 99 mg/dL (ref <200)
HDL CHOL: 20 mg/dL — ABNORMAL LOW (ref >=40)
LDL CALC: 30 mg/dL (ref 0–100)
TRIGLYCERIDES: 243 mg/dL — ABNORMAL HIGH (ref <=150)
VLDL CALC: 49 mg/dL (ref 0–50)

## 2022-10-30 MED ORDER — METOPROLOL SUCCINATE ER 25 MG TABLET,EXTENDED RELEASE 24 HR
25.0000 mg | ORAL_TABLET | Freq: Every day | ORAL | 0 refills | Status: DC
Start: 2022-10-30 — End: 2022-12-15

## 2022-11-15 ENCOUNTER — Ambulatory Visit (RURAL_HEALTH_CENTER): Payer: Medicare PPO | Attending: Family | Admitting: Family

## 2022-11-15 ENCOUNTER — Other Ambulatory Visit: Payer: Self-pay

## 2022-11-15 ENCOUNTER — Encounter (RURAL_HEALTH_CENTER): Payer: Self-pay | Admitting: Family

## 2022-11-15 VITALS — BP 115/70 | HR 68 | Temp 97.5°F | Resp 17 | Ht 67.0 in | Wt 156.6 lb

## 2022-11-15 DIAGNOSIS — Z716 Tobacco abuse counseling: Secondary | ICD-10-CM | POA: Insufficient documentation

## 2022-11-15 DIAGNOSIS — Z5320 Procedure and treatment not carried out because of patient's decision for unspecified reasons: Secondary | ICD-10-CM | POA: Insufficient documentation

## 2022-11-15 DIAGNOSIS — Z0000 Encounter for general adult medical examination without abnormal findings: Secondary | ICD-10-CM | POA: Insufficient documentation

## 2022-11-15 DIAGNOSIS — Z136 Encounter for screening for cardiovascular disorders: Secondary | ICD-10-CM | POA: Insufficient documentation

## 2022-11-15 NOTE — Nursing Note (Signed)
Patient here today for medicare wellness exam.

## 2022-11-15 NOTE — Progress Notes (Signed)
FAMILY MEDICINE, [facility] FAMILY MEDICINE [facility]  [address]  [place] [address]  Operated by [facility]  Medicare Annual Wellness Visit    Name: Corey [name] MRN:  [id number]   Date: 11/15/2022 Age: 77 y.o.       SUBJECTIVE:   Corey [name] is a 77 y.o. male for presenting for Medicare Wellness exam.   I have reviewed and reconciled the medication list with the patient today.        11/15/2022     8:08 AM   Comprehensive Health Assessment-Adult   Do you wish to complete this form? Yes   During the past 4 weeks, how would you rate your health in general? Good   During the past 4 weeks, how much difficulty have you had doing your usual activities inside and outside your home because of medical or emotional problems? No difficulty at all   During the past 4 weeks, was someone available to help you if you needed and wanted help? Yes, as much as I wanted   In the past year, how many times have you gone to the emergency department or been admitted to a hospital for a health problem? None   Are you generally satisfied with your sleep? Yes   Do you have enough money to buy things you need in everyday life, such as food, clothing, medicines, and housing? Yes, always   Can you get to places beyond walking distance without help?  (For example, can you drive your own car or travel alone on buses)? Yes   Do you fasten your seatbelt when you are in a car? Yes, sometimes   Do you exercise 20 minutes 3 or more days per week (such as walking, dancing, biking, mowing grass, swimming)? Yes, most of the time   How often do you eat food that is healthy (fruits, vegetables, lean meats) instead of unhealthy (sweets, fast food, junk food, fatty foods)? Some of the time   Have your parents, brothers or sisters had any of the following problems before the age of 36? (check all that apply) Cancer;Heart problems, or hardening of the arteries   How often do you have trouble taking medicines  the eay you are told to take them? I always take them as prescribed   Do you need any help communicating with your doctors and nurses because of vision or hearing problems? No   During the past 12 months, have you experienced confusion or memory loss that is happening more often or is getting worse? No   Do you have one person you think of as your personal doctor (primary care provider or family doctor)? Yes   If you are seeing a Primary Care Provider (PCP) or family doctor. please list their name Lauris Serviss FNP-C   Are you now also seeing any specialist physician(s) (such as eye doctor, foot doctor, skin doctor)? Yes   If you are seeing a specialist for anything such as foot, eye, skin, etc.  please list their name(s) Cardiology-[place], Urology-Dr. payne   How confident are you that you can control or manage most of your health problems? Very confident       I have reviewed and updated as appropriate the past medical, family and social history. 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    [place]  Dr Aleen Campi    Hypercholesterolemia     Hypertension     IBS (irritable bowel syndrome)     Left foot pain     Leukocytosis     Lumbar disc herniation with radiculopathy     Narcolepsy     Neck pain     OSA (obstructive sleep apnea)     Osteoarthritis     Paresthesia     Previous back surgery     Right carotid bruit     Right hip pain     Thigh pain     Thyroid nodule     Weight loss, non-intentional      Past Surgical History:   Procedure Laterality Date    Cardiac catheterization  07/13/2021    Dental surgery      Hx back surgery      Hx colonoscopy  2019    Hx coronary artery  bypass graft  08/03/2021    Hx hernia repair      Hx lap cholecystectomy      Orthopedic surgery      Prostate surgery  10/2022     Current Outpatient Medications   Medication Sig    amLODIPine (NORVASC) 5 mg Oral Tablet Take 1 Tablet (5 mg total) by mouth Once a day for 180 days    aspirin (ECOTRIN) 81 mg Oral Tablet, Delayed Release (E.C.) Take 1 Tablet (81 mg total) by mouth Once a day    atorvastatin (LIPITOR) 40 mg Oral Tablet Take 1 Tablet (40 mg total) by mouth Once a day for 180 days    cholecalciferol, vitamin D3, 1,250 mcg (50,000 unit) Oral Capsule Take 1 Capsule (50,000 Units total) by mouth Every 14 days for 180 days Indications: low vitamin D levels, prevention of vitamin D deficiency    famotidine (PEPCID) 40 mg Oral Tablet Take 1 Tablet (40 mg total) by mouth Twice daily    finasteride (PROSCAR) 5 mg Oral Tablet Take 1 Tablet (5 mg total) by mouth Once a day    metoprolol succinate (TOPROL-XL) 25 mg Oral Tablet Sustained Release 24 hr Take 1 Tablet (25 mg total) by mouth Once a day    nitroGLYCERIN (NITROSTAT) 0.4 mg Sublingual Tablet, Sublingual DISSOLVE 1 TAB UNDER TONGUE EVERY 5 MINUTES AS NEEDED FOR CHEST PAIN. DO NOT EXCEED 3 DOSES IN 15 MINUTES.    sertraline (ZOLOFT) 100 mg Oral Tablet Take 1 Tablet (100 mg total) by mouth Every night for 180 days Indications: repeated episodes of anxiety    tamsulosin (FLOMAX) 0.4 mg Oral Capsule Take 1 Capsule (0.4 mg total) by mouth Every evening after dinner for 90 days Indications: enlarged prostate with urination problem     Family Medical History:       Problem Relation (Age of Onset)    Brain cancer Father    Heart Disease Mother    Hypertension (High Blood Pressure) Mother    No Known Problems Sister, Brother, Half-Sister, Half-Brother, Maternal Aunt, Maternal Uncle, Paternal Aunt, Paternal Uncle, Maternal Grandmother, Maternal Grandfather, Paternal Grandmother, Paternal Grandfather, Daughter, Son, Other            Social  History     Socioeconomic  History    Marital status: Married     Spouse name: Myriam Jacobson    Number of children: 2    Years of education: 12+    Highest education level: Bachelor's degree (e.g., BA, AB, BS)   Tobacco Use    Smoking status: Every Day     Current packs/day: 1.00     Average packs/day: 1 pack/day for 55.4 years (55.4 ttl pk-yrs)     Types: Cigarettes     Start date: 1969    Smokeless tobacco: Never    Tobacco comments:     Counseled by A.Wanetta Funderburke NP 03/15/22   Vaping Use    Vaping status: Never Used   Substance and Sexual Activity    Alcohol use: Yes     Alcohol/week: 3.0 standard drinks of alcohol     Types: 3 Cans of beer per week     Comment: occasionally    Drug use: Never     Social Determinants of Health     Financial Resource Strain: Low Risk  (06/15/2022)    Financial Resource Strain     SDOH Financial: No   Transportation Needs: Low Risk  (06/15/2022)    Transportation Needs     SDOH Transportation: No   Social Connections: Low Risk  (06/15/2022)    Social Connections     SDOH Social Isolation: 5 or more times a week   Intimate Partner Violence: Low Risk  (06/15/2022)    Intimate Partner Violence     SDOH Domestic Violence: No   Housing Stability: Low Risk  (06/15/2022)    Housing Stability     SDOH Housing Situation: I have housing.     SDOH Housing Worry: No   Health Literacy: Low Risk  (02/14/2022)    Health Literacy     SDOH Health Literacy: Never   Employment Status: Low Risk  (06/15/2022)    Employment Status     SDOH Employment: Otherwise unemployed but not seeking work (ex. Consulting civil engineer, retired, disabled, unpaid primary care giver)         List of Current Health Care Providers   Care Team       PCP       Name Type Specialty Phone Number    [place], [name], FNP Nurse Practitioner NURSE PRACTITIONER [phone]              Care Team       Name Type Specialty Phone Number    Adline Mango, MD Not available CARDIOVASCULAR DISEASE [phone]    Cecilie Kicks, DO Not available UROLOGY [phone]                      Health  Maintenance   Topic Date Due    Hepatitis C screening  Never done    Colonoscopy  Never done    Shingles Vaccine (1 of 2) Never done    AAA Screening  Never done    Pneumococcal Vaccination, Age 93+ (2 of 2 - PCV) 03/14/2014    Covid-19 Vaccine (4 - 2023-24 season) 02/10/2022    Medicare Annual Wellness Visit - Calendar Year Insurers  Never done    CT Lung Cancer Screening  06/14/2023    Depression Screening  11/15/2023    Adult Tdap-Td (2 - Td or Tdap) 03/15/2032    Influenza Vaccine  Completed    Meningococcal Vaccine  Aged Out     Medicare Wellness Assessment  Medicare initial or wellness physical in the last year?: No  Advance Directives   Does patient have a living will or MPOA: No           Advance directive information given to the patient today?: Patient Declined (Patient already has packet)      Activities of Daily Living   Do you need help with dressing, bathing, or walking?: No   Do you need help with shopping, housekeeping, medications, or finances?: No   Do you have rugs in hallways, broken steps, or poor lighting?: No   Do you have grab bars in your bathroom, non-slip strips in your tub, and hand rails on your stairs?: Yes   Cognitive Function Screen (1=Yes, 0=No)   What is you age?: Correct   What is the time to the nearest hour?: Correct   What is the year?: Correct   What is the name of this clinic?: Incorrect   Can the patient recognize two persons (the doctor, the nurse, home help, etc.)?: Correct   What is the date of your birth? (day and month sufficient) : Correct   In what year did World War II end?: Correct   Who is the current president of the [place]?: Correct   Count from 20 down to 1?: Incorrect (missed 11)   What address did I give you earlier?: Correct   Total Score: 8   Interpretation of Total Score: Greater than 6 Normal   Fall Risk Screen   Do you feel unsteady when standing or walking?: No  Do you worry about falling?: No  Have you fallen in the past year?: Yes  How many  times have you fallen?: Once  Were you ever injured from falling?: No   Depression Screen     Little interest or pleasure in doing things.: Not at all  Feeling down, depressed, or hopeless: Several Days  PHQ 2 Total: 1     Pain Score   Pain Score:   5    Substance Use-Abuse Screening     Tobacco Use     In Past 12 MONTHS, how often have you used any tobacco product (for example, cigarettes, e-cigarettes, cigars, pipes, or smokeless tobacco)?: Daily  In the PAST 3 MONTHS, did you smoke a cigarette containing tobacco or use any other nicotine delivery product (i.e., e-cigarette, vaping or chewing tobacco)?: Yes  In the PAST 3 MONTHS, did you usually smoke more than 10 cigarettes, vape, use an e-cigarette or chew tobacco more than 10 times each day?: Yes  In the PAST 3 MONTHS, did you usually smoke/use an e-cigarette, vape or chew tobacco within 30 minutes after waking?: No     Alcohol use     In the PAST 12 MONTHS, how often have you had 5 (men)/4 (women) or more drinks containing alcohol in one day?: Never     Prescription Drug Use     In the PAST 12 months, how often have you used any prescription medications just for the feeling, more than prescribed, or that were not prescribed for you? Prescriptions may include: opioids, benzodiazepines, medications for ADHD: Never           Illicit Drug Use   In the PAST 12 MONTHS, how often have you used any drugs, including marijuana, cocaine or crack, heroin, methamphetamine, hallucinogens, ecstasy/MDMA?: Never        Hearing Screen   Have you noticed any hearing difficulties?: No  After whispering 9-1-6 how many numbers did  the patient repeat correctly?: 3  After whispering 4-7-8 how many numbers did the patient repeat correctly?: 3       Vision Screen   Right Eye = 20: 0.67   Left Eye = 20: 0.67     Urine Incontinence Screen   Urinary Incontinence Screen  Do you ever leak urine when you don't want to?: No (not anymore)                      OBJECTIVE:   BP 115/70 (Site:  Left, Patient Position: Sitting)   Pulse 68   Temp 36.4 C (97.5 F) (Temporal)   Resp 17   Ht 1.702 m (5\' 7" )   Wt 71 kg (156 lb 9 oz)   SpO2 96%   BMI 24.52 kg/m        Other appropriate exam:    Health Maintenance Due   Topic Date Due    Hepatitis C screening  Never done    Colonoscopy  Never done    Shingles Vaccine (1 of 2) Never done    AAA Screening  Never done    Pneumococcal Vaccination, Age 96+ (2 of 2 - PCV) 03/14/2014    Covid-19 Vaccine (4 - 2023-24 season) 02/10/2022    Medicare Annual Wellness Visit - Calendar Year Insurers  Never done      ASSESSMENT & PLAN:   Assessment/Plan   1. Medicare annual wellness visit, subsequent       Identified Risk Factors/ Recommended Actions     Fall Risk Follow up plan of care: Discussed optimizing home safety  Footwear and potential problems addressed  The PHQ 2 Total: 1 depression screen is interpreted as negative.    Patient continues to decline colonoscopy.  We discuss need vaccines: PCV20, RSV and Shingles. This was included in his d/c papers.     Tobacco cessation counseling performed.   He is not ready to quit the habit.   He enjoys smoking. He is participating in lung cancer screening.        Patient declined Advanced Directives information.        No orders of the defined types were placed in this encounter.         The patient has been educated about risk factors and recommended preventive care. Written Prevention Plan completed/ updated and given to patient (see After Visit Summary).    No follow-ups on file.    Asa Lente, FNP

## 2022-11-15 NOTE — Patient Instructions (Addendum)
Need Vaccines:   PCV20  RSV   Shingles.    Medicare Preventive Services  Medicare coverage information Recommendation for YOU   Heart Disease and Diabetes   Lipid profile every 5 years or more often if at risk for cardiovascular disease  Lab Results   Component Value Date    CHOLESTEROL 99 10/23/2022    HDLCHOL 20 (L) 10/23/2022    LDLCHOL 30 10/23/2022    TRIG 243 (H) 10/23/2022       Diabetes Screening with Blood Glucose test or Glucose Tolerance Test Yearly for those at risk for diabetes, up to two tests per year for those with prediabetes  Last Glucose: 75     Diabetes Self-Management Training   Initial training ten hours per year, and follow-up training two hours per subsequent year. Optional for those with diabetes    Medical Nutrition Therapy   Three hours of one-on-one counseling in first year, two hours in subsequent years. Optional for those with diabetes, kidney disease   Intensive Behavioral Therapy for Obesity  Face-to-face counseling, first month every week, month 2-6 every other week, month 7-12 every month if continued progress is documented Optional for those with Body Mass Index 30 or higher  Your Body mass index is 24.52 kg/m.   Tobacco Cessation (Quitting) Counseling   Two attempts per year, max 4 sessions per attempt, up to 8 sessions per year Optional for those who use tobacco    Cancer Screening Last Completion Date   Colorectal screening   For anyone age 73 to 63 or any age if high risk:  Screening Colonoscopy every 10 yrs if low risk,  more frequent if higher risk  OR  Cologuard Stool DNA test once every 3 years OR  Fecal Occult Blood Testing yearly OR  Flexible  Sigmoidoscopy  every 5 yr OR  CT Colonography every 5 yrs    See below for due date if applicable.   Prostate Cancer Screening  Prostate Specific Antigen blood test based on joint decision making with your provider for ages 26-69  A joint decision between you and your primary care provider   Lung Cancer Screening  Annual low dose  computed tomography (LDCT scan) is recommended for those age 80-80 who smoked 20 pack-years and are current smokers or quit smoking within past 15 years, after counseling by your doctor or nurse clinician about the possible benefits or harms. --06/14/2022  See below for due date if applicable.   Vaccinations   Respiratory syncytial virus (RSV)  Age 39 years or older: Based on shared clinical decision-making with your provider.  Pneumococcal Vaccine  Recommended routinely age 47+ with one or two separate vaccines based on your risk. Recommended before age 4 if medical conditions with increased risk  Seasonal Influenza Vaccine  Once every flu season   Hepatitis B Vaccine  3 doses if risk (including anyone with diabetes or liver disease)  Shingles Vaccine  Two doses at age 25 or older  Diphtheria Tetanus Pertussis Vaccine  ONCE as adult, booster every 10 years     Immunization History   Administered Date(s) Administered    Covid-19 Vaccine,Pfizer-BioNTech,Purple Top,12yrs+ 07/18/2019, 08/06/2019, 04/01/2020    FLUZONE HD VACCINE (ADMIN) 03/26/2017    High-Dose Influenza Vaccine, 65+ 03/19/2018    Influenza Vaccine, 6 month-adult 03/19/2019, 03/23/2020, 04/20/2021, 03/15/2022    Pneumovax 03/14/2013    Tetanus Toxoid/Diphtheria Toxoid/Acellular Pertussis Vaccine, Adsorbed 03/15/2022     Shingles vaccine and Diphtheria Tetanus Pertussis vaccines are available at  pharmacies or local health department without a prescription.   Other Preventative Screening  Last Completion Date   Glaucoma Screening   Yearly if in high risk group such as diabetes, family history, African American age 59+ or Hispanic American age 47+ See your Eye Care Provider   Hepatitis C Screening   Recommended  for those born between ages 18-79 years.   See below for due date if applicable.     HIV Testing  Recommended routinely at least ONCE, covered every year for age 56 to 45 regardless of risk, and every year for age over 47 who ask for the test or  higher risk. Yearly or up to 3 times in pregnancy    See below for due date if applicable.  Bone Densitometry   Screening is recommended for Men ages 29 and above with one or more risk factor: androgen deprivation therapy for prostate cancer, hypogonadism, frailty, primary hyperparathyroidism, hyperthyroidism  For men diagnosed with osteoporosis, follow up is recommended every years or a frequency recommended by your provider     See below for due date if applicable.   Abdominal Aortic Aneurysm Screening Ultrasound   Once with a family history of abdominal aortic aneurysms OR a male between 83-75 and have smoked at least 100 cigarettes in your lifetime.     See below for due date if applicable.       Your Personalized Schedule for Preventive Tests     Health Maintenance: Pending and Last Completed         Date Due Completion Date    Hepatitis C screening Never done ---    Colonoscopy Never done ---    Shingles Vaccine (1 of 2) Never done ---    AAA Screening Never done ---    Pneumococcal Vaccination, Age 40+ (2 of 2 - PCV) 03/14/2014 03/14/2013    Covid-19 Vaccine (4 - 2023-24 season) 02/10/2022 04/01/2020    Medicare Annual Wellness Visit - Calendar Year Insurers Never done ---    CT Lung Cancer Screening 06/14/2023 06/14/2022    Depression Screening 11/15/2023 11/15/2022    Adult Tdap-Td (2 - Td or Tdap) 03/15/2032 03/15/2022                For Information on Advanced Directives for Health Care:  [place]:  [email or web address]  [place], [place], [place], [place] General Information: [email or web address]

## 2022-11-21 ENCOUNTER — Other Ambulatory Visit (RURAL_HEALTH_CENTER): Payer: Self-pay | Admitting: Family

## 2022-11-30 ENCOUNTER — Other Ambulatory Visit (RURAL_HEALTH_CENTER): Payer: Self-pay | Admitting: Family

## 2022-11-30 ENCOUNTER — Encounter (RURAL_HEALTH_CENTER): Payer: Self-pay | Admitting: Family

## 2022-11-30 MED ORDER — FAMOTIDINE 40 MG TABLET
40.0000 mg | ORAL_TABLET | Freq: Two times a day (BID) | ORAL | 1 refills | Status: DC
Start: 2022-11-30 — End: 2022-12-15

## 2022-12-01 ENCOUNTER — Other Ambulatory Visit (RURAL_HEALTH_CENTER): Payer: Self-pay | Admitting: Family

## 2022-12-01 MED ORDER — TRAMADOL 50 MG TABLET
1.0000 | ORAL_TABLET | Freq: Four times a day (QID) | ORAL | 0 refills | Status: AC | PRN
Start: 2022-12-01 — End: 2022-12-31

## 2022-12-01 NOTE — Result Encounter Note (Signed)
Reviewed.  Repeat CBC and cholesterol panel at his July 5th appointment

## 2022-12-08 ENCOUNTER — Encounter (RURAL_HEALTH_CENTER): Payer: Self-pay | Admitting: Family

## 2022-12-12 ENCOUNTER — Other Ambulatory Visit (RURAL_HEALTH_CENTER): Payer: Self-pay | Admitting: Family

## 2022-12-15 ENCOUNTER — Encounter (RURAL_HEALTH_CENTER): Payer: Self-pay | Admitting: Family

## 2022-12-15 ENCOUNTER — Other Ambulatory Visit: Payer: Self-pay

## 2022-12-15 ENCOUNTER — Ambulatory Visit (RURAL_HEALTH_CENTER): Payer: Medicare PPO | Attending: Family | Admitting: Family

## 2022-12-15 VITALS — BP 119/74 | HR 67 | Temp 97.7°F | Resp 17 | Ht 67.0 in | Wt 158.5 lb

## 2022-12-15 DIAGNOSIS — K219 Gastro-esophageal reflux disease without esophagitis: Secondary | ICD-10-CM | POA: Insufficient documentation

## 2022-12-15 DIAGNOSIS — I25708 Atherosclerosis of coronary artery bypass graft(s), unspecified, with other forms of angina pectoris: Secondary | ICD-10-CM | POA: Insufficient documentation

## 2022-12-15 DIAGNOSIS — G8929 Other chronic pain: Secondary | ICD-10-CM | POA: Insufficient documentation

## 2022-12-15 DIAGNOSIS — F411 Generalized anxiety disorder: Secondary | ICD-10-CM | POA: Insufficient documentation

## 2022-12-15 DIAGNOSIS — E785 Hyperlipidemia, unspecified: Secondary | ICD-10-CM | POA: Insufficient documentation

## 2022-12-15 DIAGNOSIS — G4733 Obstructive sleep apnea (adult) (pediatric): Secondary | ICD-10-CM | POA: Insufficient documentation

## 2022-12-15 DIAGNOSIS — F17210 Nicotine dependence, cigarettes, uncomplicated: Secondary | ICD-10-CM | POA: Insufficient documentation

## 2022-12-15 DIAGNOSIS — M549 Dorsalgia, unspecified: Secondary | ICD-10-CM | POA: Insufficient documentation

## 2022-12-15 DIAGNOSIS — I714 Abdominal aortic aneurysm, without rupture, unspecified (CMS HCC): Secondary | ICD-10-CM

## 2022-12-15 DIAGNOSIS — I10 Essential (primary) hypertension: Secondary | ICD-10-CM | POA: Insufficient documentation

## 2022-12-15 DIAGNOSIS — E559 Vitamin D deficiency, unspecified: Secondary | ICD-10-CM | POA: Insufficient documentation

## 2022-12-15 DIAGNOSIS — N400 Enlarged prostate without lower urinary tract symptoms: Secondary | ICD-10-CM | POA: Insufficient documentation

## 2022-12-15 MED ORDER — ATORVASTATIN 40 MG TABLET
40.0000 mg | ORAL_TABLET | Freq: Every day | ORAL | 1 refills | Status: DC
Start: 2022-12-15 — End: 2023-03-19

## 2022-12-15 MED ORDER — SERTRALINE 100 MG TABLET
100.0000 mg | ORAL_TABLET | Freq: Every evening | ORAL | 1 refills | Status: DC
Start: 2022-12-15 — End: 2023-03-19

## 2022-12-15 MED ORDER — METOPROLOL SUCCINATE ER 25 MG TABLET,EXTENDED RELEASE 24 HR
25.0000 mg | ORAL_TABLET | Freq: Every day | ORAL | 1 refills | Status: DC
Start: 2022-12-15 — End: 2023-03-19

## 2022-12-15 MED ORDER — AMLODIPINE 5 MG TABLET
5.0000 mg | ORAL_TABLET | Freq: Every day | ORAL | 1 refills | Status: DC
Start: 2022-12-15 — End: 2023-03-19

## 2022-12-15 MED ORDER — OMEPRAZOLE 40 MG CAPSULE,DELAYED RELEASE
40.0000 mg | DELAYED_RELEASE_CAPSULE | Freq: Every morning | ORAL | 1 refills | Status: DC
Start: 2022-12-15 — End: 2023-03-19

## 2022-12-15 MED ORDER — CHOLECALCIFEROL (VITAMIN D3) 25 MCG (1,000 UNIT) TABLET
2000.0000 [IU] | ORAL_TABLET | Freq: Every day | ORAL | 1 refills | Status: DC
Start: 2022-12-15 — End: 2023-03-19

## 2022-12-15 MED ORDER — TAMSULOSIN 0.4 MG CAPSULE
0.4000 mg | ORAL_CAPSULE | Freq: Every evening | ORAL | 0 refills | Status: DC
Start: 2022-12-15 — End: 2023-03-19

## 2022-12-15 MED ORDER — FAMOTIDINE 40 MG TABLET
40.0000 mg | ORAL_TABLET | Freq: Two times a day (BID) | ORAL | 1 refills | Status: DC
Start: 2022-12-15 — End: 2023-03-19

## 2022-12-15 NOTE — Assessment & Plan Note (Addendum)
Last Lipid level was collected in Oct 23, 2022  Lab Results   Component Value Date    CHOLESTEROL 99 10/23/2022    HDLCHOL 20 (L) 10/23/2022    LDLCHOL 30 10/23/2022    TRIG 243 (H) 10/23/2022   Patient continues to take his statin consistently without issues.   Will cont medication. I will defer lipid level today, since its too soon.   I continue to educate him on the need for a heart healthy diet, low in saturated fat.

## 2022-12-15 NOTE — Assessment & Plan Note (Signed)
Patient failed to decrease his weekly vitamin d as instructed.   This decision was based on elevated Vitamin D level of 54 on Oct 23, 2022.   I am going to stop his weekly Vitamin D in fear of toxication.   Message was left with pharmacy and I will have [place], [place] call patients wife.   I will prescribe Vitamin D 1000IU 2 tablets daily. He will need to buy the medication OTC.   Will recheck a Vitamin D level today.

## 2022-12-15 NOTE — Assessment & Plan Note (Signed)
Primarily managed by Dr Alethia Berthold. However, Corey [name] likes me to prescribe his medication.   I had Corey [name], [place] call and speak to his wife to confirm that we had his medication list correct. I then called the pharmacy to make sure he was taking Metoprolol 25 mg daily.   Last clinical note per Dr Alethia Berthold stated that he was taking Metoprolol 25 mg (1/2 tablet) at night. Pt denied this.   I will continue his current daily metoprolol. HR and BP are WNL.   He tells me he doing well off the Plavix. He is only taking a ASA.

## 2022-12-15 NOTE — Assessment & Plan Note (Signed)
Corey [name] is very compliant with the use of his CPAP.

## 2022-12-15 NOTE — Assessment & Plan Note (Signed)
Manageable at this time. Pt denies need for refill on Tramadol currently.

## 2022-12-15 NOTE — Assessment & Plan Note (Addendum)
Controlled on a H2 blocker, famotidine and a PPI, omeprazole. Duel therapy was recommended by gastroenterology per patient. I have requested clinical notes, but have not rec'd them.   Patient does not want to stop his reflux medications.  Will continue the medications, but he now on Pepcid twice a day and omeprazole daily, check a CBC.   I will again have support staff, request clinical notes from Dr. Jaynie Bream office

## 2022-12-15 NOTE — Progress Notes (Addendum)
Corey [name],   Please print results and fax copy to [place] office   Attention Corey [name]--- Positive AAA Screening.     Positive AAA screening.   Will discuss finding at his next scheduled appt on 03/19/2023   Will send copy of testing to Corey [name] office.  Needs to be monitored- Per up to date Every 3 years for AAA 3.0-3.9cm.         FAMILY MEDICINE, [facility]  [address]  [place] [address]  Operated by [facility]     Name: Corey [name] MRN:  [id number]   Date of Birth: 1945-08-07 Age: 77 y.o.   Date: 12/15/2022  Time: 15:57     Provider: Asa Lente, FNP    Reason for visit: Follow Up (3 month follow-up/)      History of Present Illness:  Corey [name] is a 77 y.o. male presents to the clinic for his 3 month chronic disease management appt.    HTN: He does not monitor his BP at home. He does not really watch his diet.   Denies any swelling or increased sob.   CAD: He denies any issues with chest pain, since he stopped the Plavix- per cardiology decision. He is currently taking the metoprolol- per his [name]. (Corey [name] can not tell me any of his medications that he physically takes) Per pharmacy he has Metoprolol 25 mg daily...   Hyperlipidemia: He continues to take his cholesterol medication consistently.   GERD: He denies any issues with reflux. He can not recall why he on Omeprazole bid, and famotidine daily. He denies any abnormal scopes.   GAD: No issues.   Low back pain: He does not currently need a refill on his Tramadol.   OSA: He continues to use his CPAP consistently.   BPH: Post Aquablation. He tells me when he has to go to the rest room he has to go quick. Per Corey [name] he is now on Myrbetriq and not finasteride.    Vitamin D: he tells me there is a green pill he is still taking weekly, despite me asking them to decrease it to every other week.     He is having some R knee pain.     Patient Active Problem List    Diagnosis  Date Noted    GAD (generalized anxiety disorder) 09/14/2022    Coronary artery disease of bypass graft of native heart with stable angina pectoris (CMS HCC) 03/15/2022    History of constipation 03/15/2022    Positive colorectal cancer screening using Cologuard test 03/15/2022    Skin cancer 12/21/2021    Chronic back pain 12/21/2021    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    Essential hypertension 12/21/2021    Anxiety 12/21/2021    Osteoarthritis 12/21/2021    Vitamin D deficiency, unspecified 12/21/2021    Benign prostatic hyperplasia 12/21/2021    Current smoker 12/21/2021    Hyperlipidemia 08/29/2021    Heart murmur 08/29/2021    Obstructive sleep apnea treated with continuous positive airway pressure (CPAP) 08/29/2021    Right carotid bruit 08/29/2021    GERD (gastroesophageal reflux disease) 08/29/2021       Historical Data    Past Medical History:  Past Medical History:   Diagnosis Date    Abnormal laboratory test     Abnormal prostate specific antigen     Anxiety     Arteriosclerotic vascular disease     Atypical chest  pain     BPH (benign prostatic hyperplasia)     Coronary artery disease     Depression     Diarrhea     Esophageal reflux     Fatigue     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    [place]  Corey [name]    Hypercholesterolemia     Hypertension     IBS (irritable bowel syndrome)     Left foot pain     Leukocytosis     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    Lumbar disc herniation with radiculopathy     Narcolepsy     Neck pain     Nephritis 12/21/2021    OSA (obstructive sleep apnea)     Osteoarthritis     Paresthesia     Previous back surgery     Right carotid bruit     Right hip pain     Thigh pain     Thyroid nodule     Weight loss, non-intentional      Past Surgical History:  Past Surgical History:   Procedure Laterality Date    CARDIAC CATHETERIZATION  07/13/2021    [id number]    Corey [name]    DENTAL SURGERY      HX BACK SURGERY      HX COLONOSCOPY  2019    had done at [facility]    HX  CORONARY ARTERY BYPASS GRAFT  08/03/2021    Corey [name] [facility]    HX HERNIA REPAIR      HX LAP CHOLECYSTECTOMY      ORTHOPEDIC SURGERY      PROSTATE SURGERY  10/2022    Corey [name]     Allergies:  Allergies   Allergen Reactions    Penicillins  Other Adverse Reaction (Add comment) and Rash     Childhood allergy, unknown reaction     Medications:  Current Outpatient Medications   Medication Sig    amLODIPine (NORVASC) 5 mg Oral Tablet Take 1 Tablet (5 mg total) by mouth Once a day for 180 days    aspirin (ECOTRIN) 81 mg Oral Tablet, Delayed Release (E.C.) Take 1 Tablet (81 mg total) by mouth Once a day    atorvastatin (LIPITOR) 40 mg Oral Tablet Take 1 Tablet (40 mg total) by mouth Once a day for 180 days    cholecalciferol, vitamin D3, 25 mcg (1,000 unit) Oral Tablet Take 2 Tablets (2,000 Units total) by mouth Once a day for 180 days Indications: prevention of vitamin D deficiency    cyclobenzaprine (FLEXERIL) 10 mg Oral Tablet Take 1 Tablet (10 mg total) by mouth Three times a day    famotidine (PEPCID) 40 mg Oral Tablet Take 1 Tablet (40 mg total) by mouth Twice daily    metoprolol succinate (TOPROL-XL) 25 mg Oral Tablet Sustained Release 24 hr Take 1 Tablet (25 mg total) by mouth Once a day for 180 days    mirabegron (MYRBETRIQ) 25 mg Oral Tablet Sustained Release 24 hr Take 1 Tablet (25 mg total) by mouth Once a day    nitroGLYCERIN (NITROSTAT) 0.4 mg Sublingual Tablet, Sublingual DISSOLVE 1 TAB UNDER TONGUE EVERY 5 MINUTES AS NEEDED FOR CHEST PAIN. DO NOT EXCEED 3 DOSES IN 15 MINUTES.    omeprazole (PRILOSEC) 40 mg Oral Capsule, Delayed Release(E.C.) Take 1 Capsule (40 mg total) by mouth Every morning for 180 days Indications: gastroesophageal reflux disease    sertraline (ZOLOFT) 100 mg Oral Tablet Take 1 Tablet (100 mg  total) by mouth Every night for 180 days Indications: repeated episodes of anxiety    tamsulosin (FLOMAX) 0.4 mg Oral Capsule Take 1 Capsule (0.4 mg total) by mouth Every evening after dinner for 90  days Indications: enlarged prostate with urination problem    traMADoL (ULTRAM) 50 mg Oral Tablet Take 1 Tablet (50 mg total) by mouth Every 6 hours as needed for Pain for up to 30 days Indications: pain     Family History:  Family Medical History:       Problem Relation (Age of Onset)    Brain cancer Father    Heart Disease Mother    Hypertension (High Blood Pressure) Mother    No Known Problems Sister, Brother, Maternal Grandmother, Maternal Grandfather, Paternal Grandmother, Paternal Grandfather, Daughter, Son, Maternal Aunt, Maternal Uncle, Paternal Aunt, Paternal Uncle, Half-Brother, Half-Sister, Other            Social History:  Social History     Socioeconomic History    Marital status: Married     Spouse name: Myriam Jacobson    Number of children: 2    Years of education: 12+    Highest education level: Bachelor's degree (e.g., BA, AB, BS)   Tobacco Use    Smoking status: Every Day     Current packs/day: 1.00     Average packs/day: 1 pack/day for 55.5 years (55.5 ttl pk-yrs)     Types: Cigarettes     Start date: 1969    Smokeless tobacco: Never    Tobacco comments:     Counseled by A.Christophr Calix NP 03/15/22   Vaping Use    Vaping status: Never Used   Substance and Sexual Activity    Alcohol use: Yes     Alcohol/week: 3.0 standard drinks of alcohol     Types: 3 Cans of beer per week     Comment: occasionally    Drug use: Never     Social Determinants of Health     Financial Resource Strain: Low Risk  (06/15/2022)    Financial Resource Strain     SDOH Financial: No   Transportation Needs: Low Risk  (06/15/2022)    Transportation Needs     SDOH Transportation: No   Social Connections: Low Risk  (06/15/2022)    Social Connections     SDOH Social Isolation: 5 or more times a week   Intimate Partner Violence: Low Risk  (06/15/2022)    Intimate Partner Violence     SDOH Domestic Violence: No   Housing Stability: Low Risk  (06/15/2022)    Housing Stability     SDOH Housing Situation: I have housing.     SDOH Housing Worry: No            Review of Systems:  Any pertinent Review of Systems as addressed in the HPI above.    Physical Exam:  Vital Signs:  Vitals:    12/15/22 0804   BP: 119/74   Pulse: 67   Resp: 17   Temp: 36.5 C (97.7 F)   TempSrc: Temporal   SpO2: 95%   Weight: 71.9 kg (158 lb 8 oz)   Height: 1.702 m (5\' 7" )   BMI: 24.88     Physical Exam  Vitals reviewed.   Constitutional:       Appearance: Normal appearance. He is well-developed, well-groomed and normal weight. He is not ill-appearing.   HENT:      Head: Normocephalic and atraumatic.   Eyes:      General:  No scleral icterus.     Conjunctiva/sclera: Conjunctivae normal.   Cardiovascular:      Rate and Rhythm: Normal rate and regular rhythm.      Heart sounds: S1 normal and S2 normal. Murmur heard.      Systolic murmur is present with a grade of 3/6.      Comments: Ascending descending murmur heart best at RUSB.   Pulmonary:      Effort: No respiratory distress.      Breath sounds: Normal breath sounds. No wheezing, rhonchi or rales.   Abdominal:      General: Bowel sounds are normal.      Palpations: Abdomen is soft.   Musculoskeletal:      Cervical back: Neck supple.      Right lower leg: No edema.      Left lower leg: No edema.   Neurological:      Mental Status: He is alert and oriented to person, place, and time. Mental status is at baseline.   Psychiatric:         Mood and Affect: Mood and affect normal.         Behavior: Behavior normal. Behavior is cooperative.          Assessment/Plan:  (I10) Essential hypertension  (primary encounter diagnosis)  Plan: CBC/DIFF, COMPREHENSIVE METABOLIC PANEL,         NON-FASTING, VITAMIN D 25 TOTAL    (E78.5) Hyperlipidemia, unspecified hyperlipidemia type  Plan: CBC/DIFF, COMPREHENSIVE METABOLIC PANEL,         NON-FASTING, VITAMIN D 25 TOTAL    (K21.9) Gastroesophageal reflux disease, unspecified whether esophagitis present  Plan: CBC/DIFF, COMPREHENSIVE METABOLIC PANEL,         NON-FASTING, VITAMIN D 25 TOTAL    (F41.1) GAD  (generalized anxiety disorder)  Plan: CBC/DIFF, COMPREHENSIVE METABOLIC PANEL,         NON-FASTING, VITAMIN D 25 TOTAL    (I25.708) Coronary artery disease of bypass graft of native heart with stable angina pectoris (CMS HCC)  Plan: CBC/DIFF, COMPREHENSIVE METABOLIC PANEL,         NON-FASTING, VITAMIN D 25 TOTAL    (M54.9,  G89.29) Chronic back pain  Plan: CBC/DIFF, COMPREHENSIVE METABOLIC PANEL,         NON-FASTING, VITAMIN D 25 TOTAL    (N40.0) Benign prostatic hyperplasia, unspecified whether lower urinary tract symptoms present  Plan: CBC/DIFF, COMPREHENSIVE METABOLIC PANEL,         NON-FASTING, VITAMIN D 25 TOTAL    (E55.9) Vitamin D deficiency, unspecified  Plan: VITAMIN D 25 TOTAL    (G47.33) Obstructive sleep apnea treated with continuous positive airway pressure (CPAP)     Problem List Items Addressed This Visit          Cardiovascular System    Hyperlipidemia     Last Lipid level was collected in Oct 23, 2022  Lab Results   Component Value Date    CHOLESTEROL 99 10/23/2022    HDLCHOL 20 (L) 10/23/2022    LDLCHOL 30 10/23/2022    TRIG 243 (H) 10/23/2022   Patient continues to take his statin consistently without issues.   Will cont medication. I will defer lipid level today, since its too soon.   I continue to educate him on the need for a heart healthy diet, low in saturated fat.            Relevant Orders    CBC/DIFF    COMPREHENSIVE METABOLIC PANEL, NON-FASTING    VITAMIN D  25 TOTAL    Essential hypertension - Primary     Controlled on amlodipine.   Will continue the medication and check a CMP.          Relevant Orders    CBC/DIFF    COMPREHENSIVE METABOLIC PANEL, NON-FASTING    VITAMIN D 25 TOTAL    Coronary artery disease of bypass graft of native heart with stable angina pectoris (CMS [facility])     Primarily managed by Corey [name]. However, Corey [name] likes me to prescribe his medication.   I had Ernest Haber, [place] call and speak to his [name] to confirm that we had his medication list correct. I then called  the pharmacy to make sure he was taking Metoprolol 25 mg daily.   Last clinical note per Corey [name] stated that he was taking Metoprolol 25 mg (1/2 tablet) at night. Pt denied this.   I will continue his current daily metoprolol. HR and BP are WNL.   He tells me he doing well off the Plavix. He is only taking a Corey.          Relevant Orders    CBC/DIFF    COMPREHENSIVE METABOLIC PANEL, NON-FASTING    VITAMIN D 25 TOTAL       Respiratory    Obstructive sleep apnea treated with continuous positive airway pressure (CPAP)     Corey [name] is very compliant with the use of his CPAP.               Digestive    GERD (gastroesophageal reflux disease)     Controlled on a H2 blocker, famotidine and a PPI, omeprazole. Duel therapy was recommended by gastroenterology per patient. I have requested clinical notes, but have not rec'd them.   Patient does not want to stop his reflux medications.  Will continue the medications, but he now on Pepcid twice a day and omeprazole daily, check a CBC.   I will again have support staff, request clinical notes from Corey. [place] Bouchard [place]'s office           Relevant Orders    CBC/DIFF    COMPREHENSIVE METABOLIC PANEL, NON-FASTING    VITAMIN D 25 TOTAL       Endocrine    Vitamin D deficiency, unspecified     Patient failed to decrease his weekly vitamin d as instructed.   This decision was based on elevated Vitamin D level of 54 on Oct 23, 2022.   I am going to stop his weekly Vitamin D in fear of toxication.   Message was left with pharmacy and I will have [place], [place] call patients [name].   I will prescribe Vitamin D 1000IU 2 tablets daily. He will need to buy the medication OTC.   Will recheck a Vitamin D level today.          Relevant Orders    VITAMIN D 25 TOTAL       Musculoskeletal    Chronic back pain     Manageable at this time. Pt denies need for refill on Tramadol currently.          Relevant Orders    CBC/DIFF    COMPREHENSIVE METABOLIC PANEL, NON-FASTING    VITAMIN D 25 TOTAL       Urology     Benign prostatic hyperplasia     Currently being managed through Urology.         Relevant Orders    CBC/DIFF    COMPREHENSIVE METABOLIC PANEL,  NON-FASTING    VITAMIN D 25 TOTAL       Psychiatric    GAD (generalized anxiety disorder)     Stable  Continue his SSRI, Sertraline.          Relevant Orders    CBC/DIFF    COMPREHENSIVE METABOLIC PANEL, NON-FASTING    VITAMIN D 25 TOTAL      Orders Placed This Encounter    CBC/DIFF    COMPREHENSIVE METABOLIC PANEL, NON-FASTING    VITAMIN D 25 TOTAL    atorvastatin (LIPITOR) 40 mg Oral Tablet    tamsulosin (FLOMAX) 0.4 mg Oral Capsule    amLODIPine (NORVASC) 5 mg Oral Tablet    sertraline (ZOLOFT) 100 mg Oral Tablet    famotidine (PEPCID) 40 mg Oral Tablet    omeprazole (PRILOSEC) 40 mg Oral Capsule, Delayed Release(E.C.)    metoprolol succinate (TOPROL-XL) 25 mg Oral Tablet Sustained Release 24 hr    cholecalciferol, vitamin D3, 25 mcg (1,000 unit) Oral Tablet        Pt is an extremely poor historian regarding his medications. We have to call his [name] and the pharmacy to confirm his medication list.  Pt also left the office today without getting his lab work drawn again.Marland KitchenMarland Kitchen   I will have Corey [name] call him back in for the lab work next week.     I have asked him to make an appt early next week if he wants to address his knee pain.     On the day of the encounter, a total of 55 minutes was spent on this patient encounter including review of historical information, examination, documentation and post-visit activities- such as calling his pharmacy multiple times to confirm his medication list and that he was getting the appropriate medication. The time documented excludes procedural time.     Return in about 3 months (around 03/17/2023) for Chronic Disease Management.    Paola Aleshire L Akera Snowberger, FNP     Portions of this note may be dictated using voice recognition software or a dictation service. Variances in spelling and vocabulary are possible and unintentional. Not all errors are  caught/corrected. Please notify the Thereasa Parkin if any discrepancies are noted or if the meaning of any statement is not clear.

## 2022-12-15 NOTE — Nursing Note (Signed)
Patient here today for 3 month follow-up Patient states no new concerns or medication refills at this time.

## 2022-12-15 NOTE — Assessment & Plan Note (Signed)
Currently being managed through Urology.

## 2022-12-15 NOTE — Assessment & Plan Note (Signed)
Stable  Continue his SSRI, Sertraline.

## 2022-12-15 NOTE — Assessment & Plan Note (Signed)
Controlled on amlodipine.   Will continue the medication and check a CMP.

## 2022-12-19 ENCOUNTER — Ambulatory Visit: Payer: Medicare PPO | Attending: Family | Admitting: Family

## 2022-12-19 ENCOUNTER — Other Ambulatory Visit: Payer: Self-pay

## 2022-12-19 ENCOUNTER — Ambulatory Visit (RURAL_HEALTH_CENTER): Payer: Medicare PPO | Attending: Family

## 2022-12-19 DIAGNOSIS — E559 Vitamin D deficiency, unspecified: Secondary | ICD-10-CM | POA: Insufficient documentation

## 2022-12-19 DIAGNOSIS — E785 Hyperlipidemia, unspecified: Secondary | ICD-10-CM | POA: Insufficient documentation

## 2022-12-19 DIAGNOSIS — G8929 Other chronic pain: Secondary | ICD-10-CM | POA: Insufficient documentation

## 2022-12-19 DIAGNOSIS — F411 Generalized anxiety disorder: Secondary | ICD-10-CM | POA: Insufficient documentation

## 2022-12-19 DIAGNOSIS — I25708 Atherosclerosis of coronary artery bypass graft(s), unspecified, with other forms of angina pectoris: Secondary | ICD-10-CM | POA: Insufficient documentation

## 2022-12-19 DIAGNOSIS — M549 Dorsalgia, unspecified: Secondary | ICD-10-CM | POA: Insufficient documentation

## 2022-12-19 DIAGNOSIS — I10 Essential (primary) hypertension: Secondary | ICD-10-CM | POA: Insufficient documentation

## 2022-12-19 DIAGNOSIS — K219 Gastro-esophageal reflux disease without esophagitis: Secondary | ICD-10-CM | POA: Insufficient documentation

## 2022-12-19 DIAGNOSIS — N400 Enlarged prostate without lower urinary tract symptoms: Secondary | ICD-10-CM | POA: Insufficient documentation

## 2022-12-20 LAB — CBC WITH DIFF
BASOPHIL #: 0.1 10*3/uL (ref 0.00–0.10)
BASOPHIL %: 1 % (ref 0–1)
EOSINOPHIL #: 0.1 10*3/uL (ref 0.00–0.50)
EOSINOPHIL %: 1 %
HCT: 38.6 % (ref 36.7–47.1)
HGB: 12.9 g/dL (ref 12.5–16.3)
LYMPHOCYTE #: 3.2 10*3/uL — ABNORMAL HIGH (ref 1.00–3.00)
LYMPHOCYTE %: 27 % (ref 16–44)
MCH: 30.9 pg (ref 23.8–33.4)
MCHC: 33.4 g/dL (ref 32.5–36.3)
MCV: 92.3 fL (ref 73.0–96.2)
MONOCYTE #: 0.6 10*3/uL (ref 0.30–1.00)
MONOCYTE %: 5 % (ref 5–13)
MPV: 10.2 fL (ref 7.4–11.4)
NEUTROPHIL #: 7.6 10*3/uL (ref 1.85–7.80)
NEUTROPHIL %: 66 % (ref 43–77)
PLATELETS: 237 10*3/uL (ref 140–440)
RBC: 4.18 10*6/uL (ref 4.06–5.63)
RDW: 13.5 % (ref 12.1–16.2)
WBC: 11.7 10*3/uL — ABNORMAL HIGH (ref 3.6–10.2)

## 2022-12-20 LAB — COMPREHENSIVE METABOLIC PANEL, NON-FASTING
ALBUMIN/GLOBULIN RATIO: 1.8 — ABNORMAL HIGH (ref 0.8–1.4)
ALBUMIN: 4.4 g/dL (ref 3.5–5.7)
ALKALINE PHOSPHATASE: 98 U/L (ref 34–104)
ALT (SGPT): 15 U/L (ref 7–52)
ANION GAP: 7 mmol/L (ref 4–13)
AST (SGOT): 19 U/L (ref 13–39)
BILIRUBIN TOTAL: 0.6 mg/dL (ref 0.3–1.2)
BUN/CREA RATIO: 20 (ref 6–22)
BUN: 22 mg/dL (ref 7–25)
CALCIUM, CORRECTED: 9.1 mg/dL (ref 8.9–10.8)
CALCIUM: 9.4 mg/dL (ref 8.6–10.3)
CHLORIDE: 108 mmol/L — ABNORMAL HIGH (ref 98–107)
CO2 TOTAL: 25 mmol/L (ref 21–31)
CREATININE: 1.12 mg/dL (ref 0.60–1.30)
ESTIMATED GFR: 69 mL/min/{1.73_m2} (ref >59)
GLOBULIN: 2.5 — ABNORMAL LOW (ref 2.9–5.4)
GLUCOSE: 98 mg/dL (ref 74–109)
OSMOLALITY, CALCULATED: 283 mOsm/kg (ref 270–290)
POTASSIUM: 3.8 mmol/L (ref 3.5–5.1)
PROTEIN TOTAL: 6.9 g/dL (ref 6.4–8.9)
SODIUM: 140 mmol/L (ref 136–145)

## 2022-12-20 LAB — VITAMIN D 25 TOTAL: VITAMIN D: 65 ng/mL (ref 30–100)

## 2022-12-31 NOTE — Result Encounter Note (Signed)
Corey [name] you give Corey [name] a call regarding his lab results?  Comprehensive panel.  No detection of electrolyte abnormalities, no indication of liver dysfunction.  Kidney function is slightly decreased-would recommend making sure he is well hydrated.  CBC:  Slight increase in WBC's-please confirm that he does not have any signs or symptoms of infection?  No indications of anemia.   Vitamin-D:  Levels are again slightly more elevated than I would like..  I would recommend that he stop any vitamin-D supplement at this point.  Let me know what he says or if he has any questions.Marland Kitchen

## 2023-01-01 ENCOUNTER — Encounter (RURAL_HEALTH_CENTER): Payer: Self-pay | Admitting: Family

## 2023-01-01 ENCOUNTER — Other Ambulatory Visit (RURAL_HEALTH_CENTER): Payer: Self-pay | Admitting: Family

## 2023-01-01 ENCOUNTER — Ambulatory Visit (RURAL_HEALTH_CENTER): Payer: Medicare PPO | Attending: Family | Admitting: Family

## 2023-01-01 ENCOUNTER — Ambulatory Visit (RURAL_HEALTH_CENTER): Payer: Self-pay | Admitting: Family

## 2023-01-01 ENCOUNTER — Inpatient Hospital Stay
Admission: RE | Admit: 2023-01-01 | Discharge: 2023-01-01 | Disposition: A | Discharge status: Home / Self Care | Payer: Medicare PPO | Source: Ambulatory Visit | Attending: Family | Admitting: Family

## 2023-01-01 ENCOUNTER — Other Ambulatory Visit: Payer: Self-pay

## 2023-01-01 ENCOUNTER — Ambulatory Visit (HOSPITAL_COMMUNITY): Payer: Medicare PPO | Admitting: Family

## 2023-01-01 VITALS — BP 137/75 | HR 51 | Temp 98.3°F | Resp 18 | Ht 67.0 in | Wt 156.5 lb

## 2023-01-01 DIAGNOSIS — M1711 Unilateral primary osteoarthritis, right knee: Secondary | ICD-10-CM | POA: Insufficient documentation

## 2023-01-01 DIAGNOSIS — M109 Gout, unspecified: Secondary | ICD-10-CM | POA: Insufficient documentation

## 2023-01-01 MED ORDER — LIDOCAINE HCL 10 MG/ML (1 %) INJECTION SOLUTION
3.0000 mL | Freq: Once | INTRAMUSCULAR | Status: AC
Start: 2023-01-01 — End: 2023-01-01
  Administered 2023-01-01: 30 mg

## 2023-01-01 MED ORDER — COLCHICINE 0.6 MG TABLET
1.2000 mg | ORAL_TABLET | Freq: Every day | ORAL | 1 refills | Status: DC
Start: 2023-01-01 — End: 2023-08-06

## 2023-01-01 MED ORDER — TRIAMCINOLONE ACETONIDE 40 MG/ML SUSPENSION FOR INJECTION
40.0000 mg | INTRAMUSCULAR | Status: AC
Start: 2023-01-01 — End: 2023-01-01
  Administered 2023-01-01: 40 mg via INTRA_ARTICULAR

## 2023-01-01 NOTE — Result Encounter Note (Signed)
Corey Haber, MA  [name], [name], FNP  Called and inform patient about lab results and recommendations. Patient states that he understands and if he has any question or concerns he would let us know.

## 2023-01-01 NOTE — Assessment & Plan Note (Signed)
Will check a uric acid level and treat with colchicine for acute flare.   Patient educated on gout, and written information given.

## 2023-01-01 NOTE — Result Encounter Note (Signed)
Results reviewed with patient

## 2023-01-01 NOTE — Progress Notes (Signed)
FAMILY MEDICINE, [facility] FAMILY MEDICINE [facility]  [address]  [place] [address]  Operated by [facility]     Name: Lecil Moffa MRN:  [id number]   Date of Birth: 1945-12-24 Age: 77 y.o.   Date: 01/01/2023  Time: 18:05     Provider: Asa Lente, FNP    Reason for visit: Joint Injection (Right knee injection , )      History of Present Illness:  Corey [name] is a 77 y.o. male presents to the clinic for a R knee injection but tells me he woke up with extreme pain in his L big toe. He thinks he may have gout.   He denies any history of gout. But he has been eating a lot of summer veg. And other food that can cause gout.     In regards to the R Knee. He is having some discomfort and feeling of instability for approx 2 weeks. He denies any acute trauma.       Patient Active Problem List    Diagnosis Date Noted    Gout attack 01/01/2023    GAD (generalized anxiety disorder) 09/14/2022    Coronary artery disease of bypass graft of native heart with stable angina pectoris (CMS HCC) 03/15/2022    History of constipation 03/15/2022    Positive colorectal cancer screening using Cologuard test 03/15/2022    Skin cancer 12/21/2021    Chronic back pain 12/21/2021    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    Essential hypertension 12/21/2021    Anxiety 12/21/2021    Osteoarthritis 12/21/2021    Vitamin D deficiency, unspecified 12/21/2021    Benign prostatic hyperplasia 12/21/2021    Current smoker 12/21/2021    Hyperlipidemia 08/29/2021    Heart murmur 08/29/2021    Obstructive sleep apnea treated with continuous positive airway pressure (CPAP) 08/29/2021    Right carotid bruit 08/29/2021    GERD (gastroesophageal reflux disease) 08/29/2021       Historical Data    Past Medical History:  Past Medical History:   Diagnosis Date    Abnormal laboratory test     Abnormal prostate specific antigen     Anxiety     Arteriosclerotic vascular disease     Atypical chest pain      BPH (benign prostatic hyperplasia)     Coronary artery disease     Depression     Diarrhea     Esophageal reflux     Fatigue     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    [place]  Dr Aleen Campi    Hypercholesterolemia     Hypertension     IBS (irritable bowel syndrome)     Left foot pain     Leukocytosis     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    Lumbar disc herniation with radiculopathy     Narcolepsy     Neck pain     Nephritis 12/21/2021    OSA (obstructive sleep apnea)     Osteoarthritis     Paresthesia     Previous back surgery     Right carotid bruit     Right hip pain     Thigh pain     Thyroid nodule     Weight loss, non-intentional      Past Surgical History:  Past Surgical History:   Procedure Laterality Date    CARDIAC CATHETERIZATION  07/13/2021    [id number]    Dr [facility]  DENTAL SURGERY      HX BACK SURGERY      HX COLONOSCOPY  2019    had done at [facility]    HX CORONARY ARTERY BYPASS GRAFT  08/03/2021    Dr Aleen Campi [facility]    HX HERNIA REPAIR      HX LAP CHOLECYSTECTOMY      ORTHOPEDIC SURGERY      PROSTATE SURGERY  10/2022    Dr. Suzie Portela     Allergies:  Allergies   Allergen Reactions    Penicillins  Other Adverse Reaction (Add comment) and Rash     Childhood allergy, unknown reaction     Medications:  Current Outpatient Medications   Medication Sig    amLODIPine (NORVASC) 5 mg Oral Tablet Take 1 Tablet (5 mg total) by mouth Once a day for 180 days    aspirin (ECOTRIN) 81 mg Oral Tablet, Delayed Release (E.C.) Take 1 Tablet (81 mg total) by mouth Once a day    atorvastatin (LIPITOR) 40 mg Oral Tablet Take 1 Tablet (40 mg total) by mouth Once a day for 180 days    cholecalciferol, vitamin D3, 25 mcg (1,000 unit) Oral Tablet Take 2 Tablets (2,000 Units total) by mouth Once a day for 180 days Indications: prevention of vitamin D deficiency (Patient not taking: Reported on 01/01/2023)    colchicine 0.6 mg Oral Tablet Take 2 Tablets (1.2 mg total) by mouth Once a day After 1 hour you can take an additional 0.6mg   Indications: acute inflammation of the joints due to gout attack    cyclobenzaprine (FLEXERIL) 10 mg Oral Tablet Take 1 Tablet (10 mg total) by mouth Three times a day    famotidine (PEPCID) 40 mg Oral Tablet Take 1 Tablet (40 mg total) by mouth Twice daily    metoprolol succinate (TOPROL-XL) 25 mg Oral Tablet Sustained Release 24 hr Take 1 Tablet (25 mg total) by mouth Once a day for 180 days    mirabegron (MYRBETRIQ) 25 mg Oral Tablet Sustained Release 24 hr Take 1 Tablet (25 mg total) by mouth Once a day    nitroGLYCERIN (NITROSTAT) 0.4 mg Sublingual Tablet, Sublingual DISSOLVE 1 TAB UNDER TONGUE EVERY 5 MINUTES AS NEEDED FOR CHEST PAIN. DO NOT EXCEED 3 DOSES IN 15 MINUTES.    omeprazole (PRILOSEC) 40 mg Oral Capsule, Delayed Release(E.C.) Take 1 Capsule (40 mg total) by mouth Every morning for 180 days Indications: gastroesophageal reflux disease    sertraline (ZOLOFT) 100 mg Oral Tablet Take 1 Tablet (100 mg total) by mouth Every night for 180 days Indications: repeated episodes of anxiety    tamsulosin (FLOMAX) 0.4 mg Oral Capsule Take 1 Capsule (0.4 mg total) by mouth Every evening after dinner for 90 days Indications: enlarged prostate with urination problem     Family History:  Family Medical History:       Problem Relation (Age of Onset)    Brain cancer Father    Heart Disease Mother    Hypertension (High [name] Pressure) Mother    No Known Problems Sister, Brother, Maternal Grandmother, Maternal Grandfather, Paternal Grandmother, Paternal Grandfather, Daughter, Son, Maternal Aunt, Maternal Uncle, Paternal Aunt, Paternal Uncle, Half-Brother, Half-Sister, Other            Social History:  Social History     Socioeconomic History    Marital status: Married     Spouse name: Myriam Jacobson    Number of children: 2    Years of education: 12+    Highest education level:  Bachelor's degree (e.g., BA, AB, BS)   Tobacco Use    Smoking status: Every Day     Current packs/day: 1.00     Average packs/day: 1 pack/day for 55.6  years (55.6 ttl pk-yrs)     Types: Cigarettes     Start date: 1969    Smokeless tobacco: Never    Tobacco comments:     Counseled by A.Orman Matsumura NP 03/15/22   Vaping Use    Vaping status: Never Used   Substance and Sexual Activity    Alcohol use: Yes     Alcohol/week: 3.0 standard drinks of alcohol     Types: 3 Cans of beer per week     Comment: occasionally    Drug use: Never     Social Determinants of Health     Financial Resource Strain: Low Risk  (06/15/2022)    Financial Resource Strain     SDOH Financial: No   Transportation Needs: Low Risk  (06/15/2022)    Transportation Needs     SDOH Transportation: No   Social Connections: Low Risk  (06/15/2022)    Social Connections     SDOH Social Isolation: 5 or more times a week   Intimate Partner Violence: Low Risk  (06/15/2022)    Intimate Partner Violence     SDOH Domestic Violence: No   Housing Stability: Low Risk  (06/15/2022)    Housing Stability     SDOH Housing Situation: I have housing.     SDOH Housing Worry: No           Review of Systems:  Any pertinent Review of Systems as addressed in the HPI above.    Physical Exam:  Vital Signs:  Vitals:    01/01/23 1601   BP: 137/75   Pulse: 51   Resp: 18   Temp: 36.8 C (98.3 F)   TempSrc: Temporal   SpO2: 98%   Weight: 71 kg (156 lb 8 oz)   Height: 1.702 m (5\' 7" )   BMI: 24.56     Physical Exam  Constitutional:       Appearance: Normal appearance.   HENT:      Head: Normocephalic and atraumatic.   Eyes:      General: No scleral icterus.     Conjunctiva/sclera: Conjunctivae normal.   Cardiovascular:      Rate and Rhythm: Normal rate.   Pulmonary:      Effort: Pulmonary effort is normal.   Musculoskeletal:      Left knee: Crepitus present. No swelling, deformity, effusion or erythema. Decreased range of motion (mild). Tenderness present over the medial joint line and lateral joint line. Normal alignment and normal patellar mobility. Normal pulse.   Feet:      Left foot:      Skin integrity: Skin integrity normal. No erythema or  warmth.      Comments: Mild swelling, moderate discomfort  noted at the PIP joint of the L big toe.    Neurological:      Mental Status: He is alert.          Assessment/Plan:  (M17.11) Primary osteoarthritis of right knee  (primary encounter diagnosis)  Plan: URIC ACID, 54098 - ARTHROCENTESIS         ASPIRATION/INJ, MAJOR JOINT/BURSA         (SHOULDER/HIP/KNEE/SUBACROMIAL BURSA); WO [place]         (AMB ONLY-PD), Large Joint Arthrocentesis: R         knee, CANCELED: XR KNEE  RIGHT 2 VIEW    (M10.9) Gout attack     Problem List Items Addressed This Visit          Musculoskeletal    Osteoarthritis - Primary       Study Result    Narrative & Impression   Corey [name]     RADIOLOGIST: Truman Hayward, MD     XR KNEE RIGHT 4 OR MORE VIEWS performed on 01/01/2023 12:30 PM     CLINICAL HISTORY: M17.11: Primary osteoarthritis of right knee.       RIGHT KNEE PAIN     TECHNIQUE:  4 view(s) of the right knee     COMPARISON:  None.        FINDINGS:   No fracture.  No suspicious bone lesion.     Mild to moderate medial joint space narrowing. Joint space narrowing is otherwise minimal.     No effusion.  Soft tissues are unremarkable.        IMPRESSION:  DEGENERATIVE OSTEOARTHROSIS. NO ACUTE FINDINGS.               Radiologist location ID: [id number]   Xray results reviewed with patient.     Steroid injection discussed and agreed upon.   Patient tolerated well.   D/c instructions given to limit excessive motion or stress to the knee for the next couple of weeks. Wear his brace while playing golf. Elevate when able.   He has agreed to notify me immediately if any s/s of infection occur. Increased Swelling, redness, pain or warmth around the knee.   Consents were signed prior the injection.            Relevant Orders    URIC ACID    20610 - ARTHROCENTESIS ASPIRATION/INJ, MAJOR JOINT/BURSA (SHOULDER/HIP/KNEE/SUBACROMIAL BURSA); WO [place] (AMB ONLY-PD)    Large Joint Arthrocentesis: R knee       Rheumatologic    Gout attack      Will check a uric acid level and treat with colchicine for acute flare.   Patient educated on gout, and written information given.           Orders Placed This Encounter    20610 - ARTHROCENTESIS ASPIRATION/INJ, MAJOR JOINT/BURSA (SHOULDER/HIP/KNEE/SUBACROMIAL BURSA); WO [place] (AMB ONLY-PD)    Large Joint Arthrocentesis: R knee    CANCELED: XR KNEE RIGHT 2 VIEW    URIC ACID    colchicine 0.6 mg Oral Tablet    lidocaine 1% injection    triamcinolone acetonide (KENALOG-40) 40 mg/mL injection      Return if symptoms worsen or fail to improve.    Dresden Lozito L Kaion Tisdale, FNP     Portions of this note may be dictated using voice recognition software or a dictation service. Variances in spelling and vocabulary are possible and unintentional. Not all errors are caught/corrected. Please notify the Thereasa Parkin if any discrepancies are noted or if the meaning of any statement is not clear.

## 2023-01-01 NOTE — Procedures (Signed)
FAMILY MEDICINE, [facility] FAMILY MEDICINE [facility]  [address]  [place] [address]  Operated by [facility]  Procedure Note    Name: Corey [name] MRN:  [id number]   Date: 01/01/2023 DOB:  27-Jan-1946 (77 y.o.)         Large Joint Arthrocentesis: R knee  Indications: pain  Details: 25 G needle, anterolateral approach  Aspirate: 0 mL  Outcome: tolerated well, no immediate complications  Procedure, treatment alternatives, risks and benefits explained, specific risks discussed. Consent was given by the patient. Immediately prior to procedure a time out was called to verify the correct patient, procedure, equipment, support staff and site/side marked as required. Patient was prepped and draped in the usual sterile fashion.           Asa Lente, FNP

## 2023-01-01 NOTE — Result Encounter Note (Signed)
Williams, [name], MA  Sukari Grist L, FNP  Patient did not have any sign of infection.

## 2023-01-01 NOTE — Nursing Note (Signed)
Patient here today for right knee  joint injection.

## 2023-01-01 NOTE — Assessment & Plan Note (Signed)
Study Result    Narrative & Impression   Tavita HARLEY [place]     RADIOLOGIST: Truman Hayward, MD     XR KNEE RIGHT 4 OR MORE VIEWS performed on 01/01/2023 12:30 PM     CLINICAL HISTORY: M17.11: Primary osteoarthritis of right knee.       RIGHT KNEE PAIN     TECHNIQUE:  4 view(s) of the right knee     COMPARISON:  None.        FINDINGS:   No fracture.  No suspicious bone lesion.     Mild to moderate medial joint space narrowing. Joint space narrowing is otherwise minimal.     No effusion.  Soft tissues are unremarkable.        IMPRESSION:  DEGENERATIVE OSTEOARTHROSIS. NO ACUTE FINDINGS.               Radiologist location ID: [id number]   Xray results reviewed with patient.     Steroid injection discussed and agreed upon.   Patient tolerated well.   D/c instructions given to limit excessive motion or stress to the knee for the next couple of weeks. Wear his brace while playing golf. Elevate when able.   He has agreed to notify me immediately if any s/s of infection occur. Increased Swelling, redness, pain or warmth around the knee.   Consents were signed prior the injection.

## 2023-01-02 LAB — URIC ACID: URIC ACID: 3.7 mg/dL (ref 2.3–7.6)

## 2023-01-03 NOTE — Result Encounter Note (Signed)
Corey Haber, MA  Emalina Dubreuil L, FNP  Called and spoke with patient about the pain in his left toe. Patient states that dose not hurt anymore and that the medication worked like a Nature conservation officer.

## 2023-01-09 ENCOUNTER — Inpatient Hospital Stay
Admission: RE | Admit: 2023-01-09 | Discharge: 2023-01-09 | Disposition: A | Discharge status: Home / Self Care | Payer: Medicare PPO | Source: Ambulatory Visit | Attending: Family | Admitting: Family

## 2023-01-09 ENCOUNTER — Other Ambulatory Visit: Payer: Self-pay

## 2023-01-09 DIAGNOSIS — F17210 Nicotine dependence, cigarettes, uncomplicated: Secondary | ICD-10-CM | POA: Insufficient documentation

## 2023-01-09 DIAGNOSIS — Z0000 Encounter for general adult medical examination without abnormal findings: Secondary | ICD-10-CM | POA: Insufficient documentation

## 2023-01-09 DIAGNOSIS — Z136 Encounter for screening for cardiovascular disorders: Secondary | ICD-10-CM | POA: Insufficient documentation

## 2023-02-13 ENCOUNTER — Ambulatory Visit (RURAL_HEALTH_CENTER): Payer: Self-pay | Admitting: Family

## 2023-02-13 ENCOUNTER — Other Ambulatory Visit (RURAL_HEALTH_CENTER): Payer: Self-pay | Admitting: Family

## 2023-02-13 DIAGNOSIS — I714 Abdominal aortic aneurysm, without rupture, unspecified: Secondary | ICD-10-CM | POA: Insufficient documentation

## 2023-02-13 MED ORDER — TRAMADOL 50 MG TABLET
1.0000 | ORAL_TABLET | Freq: Four times a day (QID) | ORAL | 0 refills | Status: AC | PRN
Start: 2023-02-13 — End: 2023-03-15

## 2023-02-13 NOTE — Progress Notes (Signed)
Refill request

## 2023-02-13 NOTE — Result Encounter Note (Signed)
Corey [name],   Please print results and fax copy to [place] office   Attention Dr Alethia Berthold--- Positive AAA Screening.     Positive AAA screening.   Will discuss finding at his next scheduled appt on 03/19/2023   Will send copy of testing to Dr Alethia Berthold office.  Needs to be monitored- Per up to date Every 3 years for AAA 3.0-3.9cm.

## 2023-02-14 ENCOUNTER — Ambulatory Visit (RURAL_HEALTH_CENTER): Payer: Self-pay | Admitting: Family

## 2023-02-14 NOTE — Result Encounter Note (Signed)
Ernest Haber, MA  [place], [name], FNP  Faxed dr. Alethia Berthold office today 02/14/23

## 2023-02-22 ENCOUNTER — Ambulatory Visit (RURAL_HEALTH_CENTER): Payer: Self-pay | Admitting: Family

## 2023-03-06 ENCOUNTER — Other Ambulatory Visit (HOSPITAL_COMMUNITY): Payer: Self-pay | Admitting: Urology

## 2023-03-06 DIAGNOSIS — N6310 Unspecified lump in the right breast, unspecified quadrant: Secondary | ICD-10-CM

## 2023-03-06 DIAGNOSIS — N644 Mastodynia: Secondary | ICD-10-CM

## 2023-03-07 ENCOUNTER — Other Ambulatory Visit: Payer: Medicare PPO | Attending: Urology

## 2023-03-07 ENCOUNTER — Other Ambulatory Visit: Payer: Self-pay

## 2023-03-07 DIAGNOSIS — Z125 Encounter for screening for malignant neoplasm of prostate: Secondary | ICD-10-CM | POA: Insufficient documentation

## 2023-03-07 DIAGNOSIS — R399 Unspecified symptoms and signs involving the genitourinary system: Secondary | ICD-10-CM | POA: Insufficient documentation

## 2023-03-07 LAB — PSA SCREENING: PSA: 2.22 ng/mL (ref <=4.00)

## 2023-03-14 ENCOUNTER — Inpatient Hospital Stay
Admission: RE | Admit: 2023-03-14 | Discharge: 2023-03-14 | Disposition: A | Discharge status: Home / Self Care | Payer: Medicare PPO | Source: Ambulatory Visit | Attending: Urology | Admitting: Urology

## 2023-03-14 ENCOUNTER — Other Ambulatory Visit: Payer: Self-pay

## 2023-03-14 ENCOUNTER — Encounter (HOSPITAL_COMMUNITY): Payer: Self-pay

## 2023-03-14 DIAGNOSIS — N644 Mastodynia: Secondary | ICD-10-CM | POA: Insufficient documentation

## 2023-03-14 DIAGNOSIS — N6310 Unspecified lump in the right breast, unspecified quadrant: Secondary | ICD-10-CM | POA: Insufficient documentation

## 2023-03-14 HISTORY — DX: Malignant (primary) neoplasm, unspecified: C80.1

## 2023-03-19 ENCOUNTER — Other Ambulatory Visit (RURAL_HEALTH_CENTER): Payer: Self-pay | Admitting: Family

## 2023-03-19 ENCOUNTER — Other Ambulatory Visit: Payer: Self-pay

## 2023-03-19 ENCOUNTER — Encounter (RURAL_HEALTH_CENTER): Payer: Self-pay | Admitting: Family

## 2023-03-19 ENCOUNTER — Ambulatory Visit (RURAL_HEALTH_CENTER): Payer: Medicare PPO | Attending: Family | Admitting: Family

## 2023-03-19 VITALS — BP 115/67 | HR 67 | Temp 98.1°F | Resp 18 | Ht 67.0 in | Wt 159.1 lb

## 2023-03-19 DIAGNOSIS — F411 Generalized anxiety disorder: Secondary | ICD-10-CM | POA: Insufficient documentation

## 2023-03-19 DIAGNOSIS — M109 Gout, unspecified: Secondary | ICD-10-CM | POA: Insufficient documentation

## 2023-03-19 DIAGNOSIS — N644 Mastodynia: Secondary | ICD-10-CM | POA: Insufficient documentation

## 2023-03-19 DIAGNOSIS — N62 Hypertrophy of breast: Secondary | ICD-10-CM | POA: Insufficient documentation

## 2023-03-19 DIAGNOSIS — Z7182 Exercise counseling: Secondary | ICD-10-CM | POA: Insufficient documentation

## 2023-03-19 DIAGNOSIS — Z6824 Body mass index (BMI) 24.0-24.9, adult: Secondary | ICD-10-CM | POA: Insufficient documentation

## 2023-03-19 DIAGNOSIS — E785 Hyperlipidemia, unspecified: Secondary | ICD-10-CM | POA: Insufficient documentation

## 2023-03-19 DIAGNOSIS — K219 Gastro-esophageal reflux disease without esophagitis: Secondary | ICD-10-CM | POA: Insufficient documentation

## 2023-03-19 DIAGNOSIS — I10 Essential (primary) hypertension: Secondary | ICD-10-CM | POA: Insufficient documentation

## 2023-03-19 DIAGNOSIS — I714 Abdominal aortic aneurysm, without rupture, unspecified (CMS HCC): Secondary | ICD-10-CM | POA: Insufficient documentation

## 2023-03-19 DIAGNOSIS — Z713 Dietary counseling and surveillance: Secondary | ICD-10-CM | POA: Insufficient documentation

## 2023-03-19 DIAGNOSIS — E559 Vitamin D deficiency, unspecified: Secondary | ICD-10-CM | POA: Insufficient documentation

## 2023-03-19 DIAGNOSIS — I25708 Atherosclerosis of coronary artery bypass graft(s), unspecified, with other forms of angina pectoris: Secondary | ICD-10-CM | POA: Insufficient documentation

## 2023-03-19 DIAGNOSIS — N6310 Unspecified lump in the right breast, unspecified quadrant: Secondary | ICD-10-CM | POA: Insufficient documentation

## 2023-03-19 DIAGNOSIS — G4733 Obstructive sleep apnea (adult) (pediatric): Secondary | ICD-10-CM | POA: Insufficient documentation

## 2023-03-19 DIAGNOSIS — M1990 Unspecified osteoarthritis, unspecified site: Secondary | ICD-10-CM | POA: Insufficient documentation

## 2023-03-19 DIAGNOSIS — Z716 Tobacco abuse counseling: Secondary | ICD-10-CM | POA: Insufficient documentation

## 2023-03-19 DIAGNOSIS — N400 Enlarged prostate without lower urinary tract symptoms: Secondary | ICD-10-CM | POA: Insufficient documentation

## 2023-03-19 DIAGNOSIS — F17210 Nicotine dependence, cigarettes, uncomplicated: Secondary | ICD-10-CM | POA: Insufficient documentation

## 2023-03-19 MED ORDER — AMLODIPINE 5 MG TABLET
5.0000 mg | ORAL_TABLET | Freq: Every day | ORAL | 1 refills | Status: DC
Start: 2023-03-19 — End: 2023-06-29

## 2023-03-19 MED ORDER — TAMSULOSIN 0.4 MG CAPSULE
0.4000 mg | ORAL_CAPSULE | Freq: Every evening | ORAL | 0 refills | Status: DC
Start: 2023-03-19 — End: 2023-06-29

## 2023-03-19 MED ORDER — FAMOTIDINE 40 MG TABLET
40.0000 mg | ORAL_TABLET | Freq: Two times a day (BID) | ORAL | 1 refills | Status: DC
Start: 2023-03-19 — End: 2023-06-29

## 2023-03-19 MED ORDER — SERTRALINE 100 MG TABLET
100.0000 mg | ORAL_TABLET | Freq: Every evening | ORAL | 1 refills | Status: DC
Start: 2023-03-19 — End: 2023-06-29

## 2023-03-19 MED ORDER — OMEPRAZOLE 40 MG CAPSULE,DELAYED RELEASE
40.0000 mg | DELAYED_RELEASE_CAPSULE | Freq: Every morning | ORAL | 1 refills | Status: DC
Start: 2023-03-19 — End: 2023-06-29

## 2023-03-19 MED ORDER — ATORVASTATIN 40 MG TABLET
40.0000 mg | ORAL_TABLET | Freq: Every day | ORAL | 1 refills | Status: DC
Start: 2023-03-19 — End: 2023-06-29

## 2023-03-19 MED ORDER — METOPROLOL SUCCINATE ER 25 MG TABLET,EXTENDED RELEASE 24 HR
12.5000 mg | ORAL_TABLET | Freq: Every day | ORAL | 1 refills | Status: DC
Start: 2023-03-19 — End: 2023-06-29

## 2023-03-19 NOTE — Assessment & Plan Note (Addendum)
Last Vitamin D level was 65 on 12/19/2022. And vitamin D supplement was stopped.   But he now taking Prevagen. I have encouraged him to switch to Neuriva- Which does not contain Vitamin D.   Will recheck a Vitamin D level today.

## 2023-03-19 NOTE — Addendum Note (Signed)
Addended byMarcelino Duster L on: 03/19/2023 05:27 PM     Modules accepted: Orders

## 2023-03-19 NOTE — Assessment & Plan Note (Addendum)
Primarily managed by Dr Alethia Berthold. H.  Last clinical note per Dr Alethia Berthold stated that he was taking Metoprolol 25 mg (1/2 tablet) at night. Pt denied this.   HR is 67 today. I have asked them to taper his metoprolol down to the correct dose of 12.5mg . They  (he and his wife) have agreed to monitor his HR prior to taking the medication if HR is greater than 100 his is to take a whole tablet and let me know.        He tells me he doing well off the Plavix. He is only taking a ASA. - Denies any issues with GI bleed

## 2023-03-19 NOTE — Assessment & Plan Note (Signed)
Mammogram noted:     Study Result    Narrative & Impression   Matheu HARLEY Shearer     MAMMO BILAT DIAGNOSTIC W TOMO-ADDL VWS/BREAST US AS REQ BY RAD     CLINICAL HISTORY:   77 years.  Male.   N63.10: Mass of right breast  N64.4: Breast tenderness.     TECHNIQUE: Bilateral baseline male diagnostic digital breast tomosynthesis with C-View and 2D FFDM. Computer aided detection.      COMPARISON: None. Baseline exam.     FINDINGS:     There are scattered areas of fibroglandular density.  This study does not show any suspicious spiculated mass or architectural distortion. There is bilateral retroareolar tissue, which is much more prominent right than left. No suspicious calcifications or significant adenopathy is seen. CAD flags the right retroareolar tissue on CC. Tomogram does not show any focal mass, including right retroareolar.        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     NO FOCAL SUSPICIOUS LESION IS SEEN.     RECOMMENDATION:     CLINICAL FOLLOW-UP REGARDING UNDERLYING CAUSES OF GYNECOMASTIA.     ABSENCE OF DEFINITIVE IMAGING FINDINGS FOR MALIGNANCY SHOULD NOT RESULT IN DELAY OF BIOPSY OF ANY CLINICALLY SUSPICIOUS PALPABLE LESION. AT MINIMUM, RECOMMEND CAREFUL CLINICAL FOLLOW-UP OF THE AREA CONCERN WITH ADDITIONAL IMAGING IF NECESSARY.           BI-RADS 2:  BENIGN     RECOMMEND ANNUAL MAMMOGRAPHIC SCREENING.     DENSITY: B     PERFORMING FACILITY:  [facility]  [place], [place]  [address]  Phone [phone]                 Radiologist location ID: [id number]     Will refer to Dr Dewaine Conger for further evaluation and treatment.

## 2023-03-19 NOTE — Assessment & Plan Note (Signed)
Uric Acid level was 3.7 in July. This was during an acute gout attack.  Will recheck levels today and based on those results determine if Allopurinol should be  started.   Plan of care dicussed with patient.

## 2023-03-19 NOTE — Assessment & Plan Note (Addendum)
Stable  Continue his SSRI, Sertraline.   Check a cbc and a cmp

## 2023-03-19 NOTE — Assessment & Plan Note (Addendum)
Currently being managed through Urology.  Will refill his Flomax.

## 2023-03-19 NOTE — Assessment & Plan Note (Addendum)
Controlled on a H2 blocker, famotidine and a PPI, omeprazole. Duel therapy was recommended by gastroenterology per patient. This information was confirmed by Clinical notes dated 10-26-2022  Will continue the medications, Pepcid  daily,  and omeprazole  bid recheck a CBC.   He is scheduled to see Dr Kirtland Bouchard patel on 04-26-23 at 1 pm.  Next EGD is due- I want him to have a Colonoscopy at the same time- will continue to discuss this.

## 2023-03-19 NOTE — Nursing Note (Signed)
Patient here today for 3 month follow-up and medication refills.

## 2023-03-19 NOTE — Assessment & Plan Note (Addendum)
AAA screening on 01/09/2023 noted:   Corey [name]     RADIOLOGIST: Alvester Chou, MD     ABDOMINAL DUPLEX - AORTA - MEDICARE SCREEN FOR AAA performed on 01/09/2023 10:53 AM     CLINICAL HISTORY:  Z00.00: Medicare annual wellness visit, subsequent abdominal aortic aneurysm screening. Smoking history.        COMPARISON: None.     FINDINGS:   Abdominal aorta measurements:        Proximal:   2.2 cm x 2.8 cm       Mid:            2.8 cm x 3.0 cm       Distal:        2.6 cm x 2.9 cm     Iliac arterial measurements at bifurcation:       Right: 1.3 cm       Left:  1.4 cm     There is a borderline sized abdominal aortic aneurysm measuring 3 cm in diameter. 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   We discussed the importance and impact smoking plays- Again I encourage him to stop smoking.  We also discussed the importance of good BP control.   Results were faxed to Cardiology on 02-14-2023

## 2023-03-19 NOTE — Assessment & Plan Note (Addendum)
Last Lipid level was collected in Oct 23, 2022  Lab Results   Component Value Date    CHOLESTEROL 99 10/23/2022    HDLCHOL 20 (L) 10/23/2022    LDLCHOL 30 10/23/2022    TRIG 243 (H) 10/23/2022   Patient continues to take his statin, atorvastatin, consistently without issues.   Will recheck a lipid level. - I have asked for a fasting sample. He going to go tomorrow morning to the hospital.   I continue to educate him on the need for a heart healthy diet, low in saturated fat.

## 2023-03-19 NOTE — Assessment & Plan Note (Signed)
Denies any current issues. He does not need a refill on his Tramadol at this time.

## 2023-03-19 NOTE — Assessment & Plan Note (Addendum)
Corey [name] is  mostly compliant with the use of his CPAP.  Continue to encourage the consistent use of the Cpap

## 2023-03-19 NOTE — Assessment & Plan Note (Addendum)
Well Controlled on amlodipine.   Will continue the Calcium Channel Blocker and check a CMP.

## 2023-03-19 NOTE — Progress Notes (Addendum)
FAMILY MEDICINE, [facility] FAMILY MEDICINE [facility]  [address]  [place] [address]  Operated by [facility]     Name: Corey [name] MRN:  [id number]   Date of Birth: 1945-09-11 Age: 77 y.o.   Date: 03/19/2023  Time: 17:27     Provider: Asa Lente, FNP    Reason for visit: Follow Up (3 month follow-up AND MEDICATION REFILLS. )      History of Present Illness:  Corey [name] is a 77 y.o. male presents to the clinic for his 3 month chronic disease management appt. His wife is with him today:-)    HTN: He does not monitor his BP at home. He does not really watch his diet.   Denies any swelling or increased sob.   CAD: He denies any issues with chest pain. He continues to take his ASA.Marland Kitchen Plavix was stopped by Cardiology. He is currently taking the metoprolol (whole tablet) per his wife.  He is scheduled to see Cardiology in Dec.   Hyperlipidemia: He continues to take his cholesterol medication consistently. Denies any stroke like symptoms.   GERD: He denies any issues with reflux. He is taking his Omeprazole bid, and famotidine daily.   GAD: No issues.   Low back pain: He does not currently need a refill on his Tramadol.   OSA: He continues to use his CPAP consistently- unless it irritates him during the night, which happens and he ends up throwing it off.   BPH: Post Aquablation. He doing much better. Per Mr Blakeman wife he is now on Myrbetriq and tamsulosin.  Vitamin D: He did stop his Vitamin d supplement? Last level was 65- But then he started taking Prevagen for memory (which contains vitamin-D- 2000iu)    AAA- Denies any chest pain.       Gout: He tells me the colchicine helped, gout pain was gone by the next day.  He did not make any changes in his diet.  He has not had any further symptoms.    He is having some issues with a lump and nipple pain in his R nipple. He tells me that Dr Suzie Portela with urology ordered Mammogram-which showed gynecomastia. He tells me  he has pain in the nipple area when he touches only. No drainage.   Symptoms started in August.     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    AAA (abdominal aortic aneurysm) (CMS HCC) 02/13/2023    Gout attack 01/01/2023    GAD (generalized anxiety disorder) 09/14/2022    Coronary artery disease of bypass graft of native heart with stable angina pectoris (CMS HCC) 03/15/2022    History of constipation 03/15/2022    Positive colorectal cancer screening using Cologuard test 03/15/2022    Skin cancer 12/21/2021    Chronic back pain 12/21/2021    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    Essential hypertension 12/21/2021    Anxiety 12/21/2021    Osteoarthritis 12/21/2021    Vitamin D deficiency, unspecified 12/21/2021    Benign prostatic hyperplasia 12/21/2021    Current smoker 12/21/2021    Hyperlipidemia 08/29/2021    Heart murmur 08/29/2021    Obstructive sleep apnea treated with continuous positive airway pressure (CPAP) 08/29/2021    Right carotid bruit 08/29/2021    GERD (gastroesophageal reflux disease) 08/29/2021       Historical Data    Past Medical History:  Past Medical History:  Diagnosis Date    Abnormal laboratory test     Abnormal prostate specific antigen     Anxiety     Arteriosclerotic vascular disease     Atypical chest pain     BPH (benign prostatic hyperplasia)     Cancer (CMS HCC)     skin    Coronary artery disease     Depression     Diarrhea     Esophageal reflux     Fatigue     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    [place]  Dr Aleen Campi    Hypercholesterolemia     Hypertension     IBS (irritable bowel syndrome)     Left foot pain     Leukocytosis     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    Lumbar disc herniation with radiculopathy     Narcolepsy     Neck pain     Nephritis 12/21/2021    OSA (obstructive sleep apnea)     Osteoarthritis     Paresthesia     Previous back surgery     Right carotid bruit     Right hip pain     Thigh pain      Thyroid nodule     Weight loss, non-intentional      Past Surgical History:  Past Surgical History:   Procedure Laterality Date    CARDIAC CATHETERIZATION  07/13/2021    [id number]    Dr [facility]    DENTAL SURGERY      HX BACK SURGERY      HX COLONOSCOPY  2019    had done at [facility]    HX CORONARY ARTERY BYPASS GRAFT  08/03/2021    Dr Aleen Campi [facility]    HX HERNIA REPAIR      HX LAP CHOLECYSTECTOMY      ORTHOPEDIC SURGERY      PROSTATE SURGERY  10/2022    Dr. Suzie Portela     Allergies:  Allergies   Allergen Reactions    Penicillins  Other Adverse Reaction (Add comment) and Rash     Childhood allergy, unknown reaction     Medications:  Current Outpatient Medications   Medication Sig    amLODIPine (NORVASC) 5 mg Oral Tablet Take 1 Tablet (5 mg total) by mouth Once a day for 180 days    aspirin (ECOTRIN) 81 mg Oral Tablet, Delayed Release (E.C.) Take 1 Tablet (81 mg total) by mouth Once a day    atorvastatin (LIPITOR) 40 mg Oral Tablet Take 1 Tablet (40 mg total) by mouth Once a day for 180 days    colchicine 0.6 mg Oral Tablet Take 2 Tablets (1.2 mg total) by mouth Once a day After 1 hour you can take an additional 0.6mg  Indications: acute inflammation of the joints due to gout attack    cyclobenzaprine (FLEXERIL) 10 mg Oral Tablet Take 1 Tablet (10 mg total) by mouth Three times a day    famotidine (PEPCID) 40 mg Oral Tablet Take 1 Tablet (40 mg total) by mouth Twice daily    metoprolol succinate (TOPROL-XL) 25 mg Oral Tablet Sustained Release 24 hr Take 0.5 Tablets (12.5 mg total) by mouth Once a day for 180 days Wean to 1/2 tablet, take whole tablet if heart rate is greater than 100    mirabegron (MYRBETRIQ) 25 mg Oral Tablet Sustained Release 24 hr Take 1 Tablet (25 mg total) by mouth Once a day    nitroGLYCERIN (NITROSTAT) 0.4 mg Sublingual  Tablet, Sublingual DISSOLVE 1 TAB UNDER TONGUE EVERY 5 MINUTES AS NEEDED FOR CHEST PAIN. DO NOT EXCEED 3 DOSES IN 15 MINUTES.    omeprazole (PRILOSEC) 40 mg Oral Capsule, Delayed  Release(E.C.) Take 1 Capsule (40 mg total) by mouth Every morning for 180 days Indications: gastroesophageal reflux disease    sertraline (ZOLOFT) 100 mg Oral Tablet Take 1 Tablet (100 mg total) by mouth Every night for 180 days Indications: repeated episodes of anxiety    tamsulosin (FLOMAX) 0.4 mg Oral Capsule Take 1 Capsule (0.4 mg total) by mouth Every evening after dinner for 90 days Indications: enlarged prostate with urination problem     Family History:  Family Medical History:       Problem Relation (Age of Onset)    Brain cancer Father    Heart Disease Mother    Hypertension (High Blood Pressure) Mother    No Known Problems Sister, Brother, Maternal Grandmother, Maternal Grandfather, Paternal Grandmother, Paternal Grandfather, Daughter, Son, Maternal Aunt, Maternal Uncle, Paternal Aunt, Paternal Uncle, Half-Brother, Half-Sister, Other            Social History:  Social History     Socioeconomic History    Marital status: Married     Spouse name: Myriam Jacobson    Number of children: 2    Years of education: 12+    Highest education level: Bachelor's degree (e.g., BA, AB, BS)   Tobacco Use    Smoking status: Every Day     Current packs/day: 1.00     Average packs/day: 1 pack/day for 55.8 years (55.8 ttl pk-yrs)     Types: Cigarettes     Start date: 1969    Smokeless tobacco: Never    Tobacco comments:     Counseled by A.Yashica Sterbenz NP 03/15/22   Vaping Use    Vaping status: Never Used   Substance and Sexual Activity    Alcohol use: Yes     Alcohol/week: 3.0 standard drinks of alcohol     Types: 3 Cans of beer per week     Comment: occasionally    Drug use: Never     Social Determinants of Health     Financial Resource Strain: Low Risk  (06/15/2022)    Financial Resource Strain     SDOH Financial: No   Transportation Needs: Low Risk  (06/15/2022)    Transportation Needs     SDOH Transportation: No   Social Connections: Low Risk  (06/15/2022)    Social Connections     SDOH Social Isolation: 5 or more times a week   Intimate  Partner Violence: Low Risk  (06/15/2022)    Intimate Partner Violence     SDOH Domestic Violence: No   Housing Stability: Low Risk  (06/15/2022)    Housing Stability     SDOH Housing Situation: I have housing.     SDOH Housing Worry: No           Review of Systems:  Any pertinent Review of Systems as addressed in the HPI above.    Physical Exam:  Vital Signs:  Vitals:    03/19/23 0804   BP: 115/67   Pulse: 67   Resp: 18   Temp: 36.7 C (98.1 F)   TempSrc: Temporal   SpO2: 95%   Weight: 72.2 kg (159 lb 2 oz)   Height: 1.702 m (5\' 7" )   BMI: 24.97       Physical Exam  Vitals reviewed.   Constitutional:  Appearance: Normal appearance. He is well-developed, well-groomed and normal weight. He is not ill-appearing.   HENT:      Head: Normocephalic and atraumatic.   Eyes:      General: No scleral icterus.     Conjunctiva/sclera: Conjunctivae normal.   Cardiovascular:      Rate and Rhythm: Normal rate and regular rhythm.      Heart sounds: S1 normal and S2 normal. Murmur heard.      Systolic murmur is present with a grade of 3/6.      Comments: Ascending descending murmur heart best at RUSB.   Pulmonary:      Effort: No respiratory distress.      Breath sounds: Normal breath sounds. No wheezing, rhonchi or rales.   Chest:      Chest wall: No tenderness.   Breasts:     Breasts are asymmetrical.      Right: Mass and tenderness present. No nipple discharge or skin change.      Left: Normal.       Abdominal:      Palpations: Abdomen is soft.   Musculoskeletal:      Cervical back: Neck supple.      Right lower leg: No edema.      Left lower leg: No edema.   Lymphadenopathy:      Upper Body:      Right upper body: No supraclavicular, axillary or pectoral adenopathy.   Neurological:      Mental Status: He is alert and oriented to person, place, and time. Mental status is at baseline.   Psychiatric:         Mood and Affect: Mood and affect normal.         Behavior: Behavior normal. Behavior is cooperative.           Assessment/Plan:  (I71.40) Abdominal aortic aneurysm (AAA) without rupture, unspecified part (CMS HCC)  (primary encounter diagnosis)  Plan: CBC/DIFF, COMPREHENSIVE METABOLIC PANEL,         NON-FASTING, LIPID PANEL, URIC ACID, VITAMIN D         25 TOTAL, Referral to External Provider (AMB)    (I10) Essential hypertension  Plan: CBC/DIFF, COMPREHENSIVE METABOLIC PANEL,         NON-FASTING, LIPID PANEL, URIC ACID, VITAMIN D         25 TOTAL, Referral to External Provider (AMB)    (Z71.6) Encounter for smoking cessation counseling    (I25.708) Coronary artery disease of bypass graft of native heart with stable angina pectoris (CMS HCC)  Plan: CBC/DIFF, COMPREHENSIVE METABOLIC PANEL,         NON-FASTING, LIPID PANEL, URIC ACID, VITAMIN D         25 TOTAL, Referral to External Provider (AMB)    (E78.5) Hyperlipidemia, unspecified hyperlipidemia type  Plan: CBC/DIFF, COMPREHENSIVE METABOLIC PANEL,         NON-FASTING, LIPID PANEL, URIC ACID, VITAMIN D         25 TOTAL, Referral to External Provider (AMB)    (G47.33) Obstructive sleep apnea treated with continuous positive airway pressure (CPAP)  Plan: CBC/DIFF, COMPREHENSIVE METABOLIC PANEL,         NON-FASTING, LIPID PANEL, URIC ACID, VITAMIN D         25 TOTAL, Referral to External Provider (AMB)    (K21.9) Gastroesophageal reflux disease without esophagitis  Plan: CBC/DIFF, COMPREHENSIVE METABOLIC PANEL,         NON-FASTING, LIPID PANEL, URIC ACID, VITAMIN D  25 TOTAL, Referral to External Provider (AMB)    (E55.9) Vitamin D deficiency, unspecified  Plan: CBC/DIFF, COMPREHENSIVE METABOLIC PANEL,         NON-FASTING, LIPID PANEL, URIC ACID, VITAMIN D         25 TOTAL, Referral to External Provider (AMB)    (N40.0) Benign prostatic hyperplasia, unspecified whether lower urinary tract symptoms present  Plan: CBC/DIFF, COMPREHENSIVE METABOLIC PANEL,         NON-FASTING, LIPID PANEL, URIC ACID, VITAMIN D         25 TOTAL, Referral to External Provider  (AMB)    (F41.1) GAD (generalized anxiety disorder)  Plan: CBC/DIFF, COMPREHENSIVE METABOLIC PANEL,         NON-FASTING, LIPID PANEL, URIC ACID, VITAMIN D         25 TOTAL, Referral to External Provider (AMB)    (M10.9) Gout attack  Plan: CBC/DIFF, COMPREHENSIVE METABOLIC PANEL,         NON-FASTING, LIPID PANEL, URIC ACID, VITAMIN D         25 TOTAL, Referral to External Provider (AMB)    (M19.90) Osteoarthritis, unspecified osteoarthritis type, unspecified site    (N62) Gynecomastia, male    (N64.4) Nipple pain    (N63.10) Breast mass, right    (Z71.3) Nutritional counseling    (Z71.82) Exercise counseling       Problem List Items Addressed This Visit          Cardiovascular System    Hyperlipidemia         Last Lipid level was collected in Oct 23, 2022  Lab Results   Component Value Date    CHOLESTEROL 99 10/23/2022    HDLCHOL 20 (L) 10/23/2022    LDLCHOL 30 10/23/2022    TRIG 243 (H) 10/23/2022   Patient continues to take his statin, atorvastatin, consistently without issues.   Will recheck a lipid level. - I have asked for a fasting sample. He going to go tomorrow morning to the hospital.   I continue to educate him on the need for a heart healthy diet, low in saturated fat.            Relevant Orders    CBC/DIFF    COMPREHENSIVE METABOLIC PANEL, NON-FASTING    LIPID PANEL    URIC ACID    VITAMIN D 25 TOTAL    Referral to External Provider (AMB)    Essential hypertension     Well Controlled on amlodipine.   Will continue the Calcium Channel Blocker and check a CMP.          Relevant Orders    CBC/DIFF    COMPREHENSIVE METABOLIC PANEL, NON-FASTING    LIPID PANEL    URIC ACID    VITAMIN D 25 TOTAL    Referral to External Provider (AMB)    Coronary artery disease of bypass graft of native heart with stable angina pectoris (CMS [facility])     Primarily managed by Dr Alethia Berthold. H.  Last clinical note per Dr Alethia Berthold stated that he was taking Metoprolol 25 mg (1/2 tablet) at night. Pt denied this.   HR is 67 today. I have  asked them to taper his metoprolol down to the correct dose of 12.5mg . They  (he and his wife) have agreed to monitor his HR prior to taking the medication if HR is greater than 100 his is to take a whole tablet and let me know.        He tells me he  doing well off the Plavix. He is only taking a ASA. - Denies any issues with GI bleed           Relevant Orders    CBC/DIFF    COMPREHENSIVE METABOLIC PANEL, NON-FASTING    LIPID PANEL    URIC ACID    VITAMIN D 25 TOTAL    Referral to External Provider (AMB)    AAA (abdominal aortic aneurysm) (CMS HCC) - Primary     AAA screening on 01/09/2023 noted:   Jaquavious HARLEY Mccorkle     RADIOLOGIST: Alvester Chou, MD     ABDOMINAL DUPLEX - AORTA - MEDICARE SCREEN FOR AAA performed on 01/09/2023 10:53 AM     CLINICAL HISTORY:  Z00.00: Medicare annual wellness visit, subsequent abdominal aortic aneurysm screening. Smoking history.        COMPARISON: None.     FINDINGS:   Abdominal aorta measurements:        Proximal:   2.2 cm x 2.8 cm       Mid:            2.8 cm x 3.0 cm       Distal:        2.6 cm x 2.9 cm     Iliac arterial measurements at bifurcation:       Right: 1.3 cm       Left:  1.4 cm     There is a borderline sized abdominal aortic aneurysm measuring 3 cm in diameter. There is some mild atherosclerotic disease present     DOPPLER:   Color Doppler: Normal color flow doppler signal  Spectral Doppler: Normal arterial waveforms        IMPRESSION:  3 cm borderline sized abdominal aortic aneurysm                 Radiologist location ID: [id number]    Results of finding discussed at today's visit.   We discussed the importance and impact smoking plays- Again I encourage him to stop smoking.  We also discussed the importance of good BP control.   Results were faxed to Cardiology on 02-14-2023         Relevant Orders    CBC/DIFF    COMPREHENSIVE METABOLIC PANEL, NON-FASTING    LIPID PANEL    URIC ACID    VITAMIN D 25 TOTAL    Referral to External Provider (AMB)       Respiratory     Obstructive sleep apnea treated with continuous positive airway pressure (CPAP)     Mr Lathem is  mostly compliant with the use of his CPAP.  Continue to encourage the consistent use of the Cpap           Relevant Orders    CBC/DIFF    COMPREHENSIVE METABOLIC PANEL, NON-FASTING    LIPID PANEL    URIC ACID    VITAMIN D 25 TOTAL    Referral to External Provider (AMB)       Digestive    GERD (gastroesophageal reflux disease)     Controlled on a H2 blocker, famotidine and a PPI, omeprazole. Duel therapy was recommended by gastroenterology per patient. This information was confirmed by Clinical notes dated 10-26-2022  Will continue the medications, Pepcid  daily,  and omeprazole  bid recheck a CBC.   He is scheduled to see Dr Kirtland Bouchard patel on 04-26-23 at 1 pm.  Next EGD is due- I want him to have a Colonoscopy at the same time-  will continue to discuss this.          Relevant Orders    CBC/DIFF    COMPREHENSIVE METABOLIC PANEL, NON-FASTING    LIPID PANEL    URIC ACID    VITAMIN D 25 TOTAL    Referral to External Provider (AMB)       Endocrine    Vitamin D deficiency, unspecified     Last Vitamin D level was 65 on 12/19/2022. And vitamin D supplement was stopped.   But he now taking Prevagen. I have encouraged him to switch to Neuriva- Which does not contain Vitamin D.   Will recheck a Vitamin D level today.          Relevant Orders    CBC/DIFF    COMPREHENSIVE METABOLIC PANEL, NON-FASTING    LIPID PANEL    URIC ACID    VITAMIN D 25 TOTAL    Referral to External Provider (AMB)       Musculoskeletal    Osteoarthritis     Denies any current issues. He does not need a refill on his Tramadol at this time.             Urology    Benign prostatic hyperplasia     Currently being managed through Urology.  Will refill his Flomax.            Relevant Orders    CBC/DIFF    COMPREHENSIVE METABOLIC PANEL, NON-FASTING    LIPID PANEL    URIC ACID    VITAMIN D 25 TOTAL    Referral to External Provider (AMB)       Psychiatric    GAD (generalized  anxiety disorder)     Stable  Continue his SSRI, Sertraline.   Check a cbc and a cmp         Relevant Orders    CBC/DIFF    COMPREHENSIVE METABOLIC PANEL, NON-FASTING    LIPID PANEL    URIC ACID    VITAMIN D 25 TOTAL    Referral to External Provider (AMB)       Rheumatologic    Gout attack     Uric Acid level was 3.7 in July. This was during an acute gout attack.  Will recheck levels today and based on those results determine if Allopurinol should be  started.   Plan of care dicussed with patient.          Relevant Orders    CBC/DIFF    COMPREHENSIVE METABOLIC PANEL, NON-FASTING    LIPID PANEL    URIC ACID    VITAMIN D 25 TOTAL    Referral to External Provider (AMB)       Obstetrics/Gynecology    Gynecomastia, male     Mammogram noted:     Study Result    Narrative & Impression   Avyn HARLEY Lacek     MAMMO BILAT DIAGNOSTIC W TOMO-ADDL VWS/BREAST US AS REQ BY RAD     CLINICAL HISTORY:   77 years.  Male.   N63.10: Mass of right breast  N64.4: Breast tenderness.     TECHNIQUE: Bilateral baseline male diagnostic digital breast tomosynthesis with C-View and 2D FFDM. Computer aided detection.      COMPARISON: None. Baseline exam.     FINDINGS:     There are scattered areas of fibroglandular density.  This study does not show any suspicious spiculated mass or architectural distortion. There is bilateral retroareolar tissue, which is much more prominent right than left. No  suspicious calcifications or significant adenopathy is seen. CAD flags the right retroareolar tissue on CC. Tomogram does not show any focal mass, including right retroareolar.        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     NO FOCAL SUSPICIOUS LESION IS SEEN.     RECOMMENDATION:     CLINICAL FOLLOW-UP REGARDING UNDERLYING CAUSES OF GYNECOMASTIA.     ABSENCE OF DEFINITIVE IMAGING FINDINGS FOR MALIGNANCY SHOULD NOT RESULT IN DELAY OF BIOPSY OF ANY CLINICALLY SUSPICIOUS PALPABLE  LESION. AT MINIMUM, RECOMMEND CAREFUL CLINICAL FOLLOW-UP OF THE AREA CONCERN WITH ADDITIONAL IMAGING IF NECESSARY.           BI-RADS 2:  BENIGN     RECOMMEND ANNUAL MAMMOGRAPHIC SCREENING.     DENSITY: B     PERFORMING FACILITY:  [facility]  [place], [place]  [address]  Phone [phone]                 Radiologist location ID: [id number]     Will refer to Dr Dewaine Conger for further evaluation and treatment.           Other Visit Diagnoses       Encounter for smoking cessation counseling        Nipple pain        Breast mass, right        Nutritional counseling        Exercise counseling                 Orders Placed This Encounter    CBC/DIFF    COMPREHENSIVE METABOLIC PANEL, NON-FASTING    LIPID PANEL    URIC ACID    VITAMIN D 25 TOTAL    Referral to External Provider (AMB)    atorvastatin (LIPITOR) 40 mg Oral Tablet    metoprolol succinate (TOPROL-XL) 25 mg Oral Tablet Sustained Release 24 hr    amLODIPine (NORVASC) 5 mg Oral Tablet    famotidine (PEPCID) 40 mg Oral Tablet    omeprazole (PRILOSEC) 40 mg Oral Capsule, Delayed Release(E.C.)    sertraline (ZOLOFT) 100 mg Oral Tablet    tamsulosin (FLOMAX) 0.4 mg Oral Capsule        Depression screening is negative. PHQ 2 Total: 0     Tobacco cessation counseling performed.        Return in about 3 months (around 06/19/2023) for Chronic Disease Management.      On the day of the encounter, a total of 45 minutes was spent on this patient encounter including review of historical information, examination, documentation and post-visit activities. The time documented excludes procedural time.   Leeana Creer L Suleiman Finigan, FNP-C     Portions of this note may be dictated using voice recognition software or a dictation service. Variances in spelling and vocabulary are possible and unintentional. Not all errors are caught/corrected. Please notify the Thereasa Parkin if any discrepancies are noted or if the meaning of any statement is not clear.

## 2023-03-21 ENCOUNTER — Other Ambulatory Visit: Payer: Medicare PPO | Attending: Family

## 2023-03-21 ENCOUNTER — Other Ambulatory Visit: Payer: Self-pay

## 2023-03-21 DIAGNOSIS — E559 Vitamin D deficiency, unspecified: Secondary | ICD-10-CM | POA: Insufficient documentation

## 2023-03-21 DIAGNOSIS — I10 Essential (primary) hypertension: Secondary | ICD-10-CM | POA: Insufficient documentation

## 2023-03-21 DIAGNOSIS — E785 Hyperlipidemia, unspecified: Secondary | ICD-10-CM | POA: Insufficient documentation

## 2023-03-21 DIAGNOSIS — G4733 Obstructive sleep apnea (adult) (pediatric): Secondary | ICD-10-CM | POA: Insufficient documentation

## 2023-03-21 DIAGNOSIS — F411 Generalized anxiety disorder: Secondary | ICD-10-CM | POA: Insufficient documentation

## 2023-03-21 DIAGNOSIS — I714 Abdominal aortic aneurysm, without rupture, unspecified: Secondary | ICD-10-CM | POA: Insufficient documentation

## 2023-03-21 DIAGNOSIS — M109 Gout, unspecified: Secondary | ICD-10-CM | POA: Insufficient documentation

## 2023-03-21 DIAGNOSIS — I25708 Atherosclerosis of coronary artery bypass graft(s), unspecified, with other forms of angina pectoris: Secondary | ICD-10-CM | POA: Insufficient documentation

## 2023-03-21 DIAGNOSIS — N400 Enlarged prostate without lower urinary tract symptoms: Secondary | ICD-10-CM | POA: Insufficient documentation

## 2023-03-21 DIAGNOSIS — K219 Gastro-esophageal reflux disease without esophagitis: Secondary | ICD-10-CM | POA: Insufficient documentation

## 2023-03-21 LAB — LIPID PANEL
CHOL/HDL RATIO: 3.9
CHOLESTEROL: 86 mg/dL (ref <200)
HDL CHOL: 22 mg/dL — ABNORMAL LOW (ref >=40)
LDL CALC: 36 mg/dL (ref 0–100)
TRIGLYCERIDES: 139 mg/dL (ref <=150)
VLDL CALC: 28 mg/dL (ref 0–50)

## 2023-03-21 LAB — COMPREHENSIVE METABOLIC PANEL, NON-FASTING
ALBUMIN/GLOBULIN RATIO: 1.5 — ABNORMAL HIGH (ref 0.8–1.4)
ALBUMIN: 4.3 g/dL (ref 3.5–5.7)
ALKALINE PHOSPHATASE: 102 U/L (ref 34–104)
ALT (SGPT): 13 U/L (ref 7–52)
ANION GAP: 7 mmol/L (ref 4–13)
AST (SGOT): 17 U/L (ref 13–39)
BILIRUBIN TOTAL: 0.7 mg/dL (ref 0.3–1.0)
BUN/CREA RATIO: 15 (ref 6–22)
BUN: 16 mg/dL (ref 7–25)
CALCIUM, CORRECTED: 9 mg/dL (ref 8.9–10.8)
CALCIUM: 9.2 mg/dL (ref 8.6–10.3)
CHLORIDE: 108 mmol/L — ABNORMAL HIGH (ref 98–107)
CO2 TOTAL: 28 mmol/L (ref 21–31)
CREATININE: 1.06 mg/dL (ref 0.60–1.30)
ESTIMATED GFR: 73 mL/min/{1.73_m2} (ref >59)
GLOBULIN: 2.8 — ABNORMAL LOW (ref 2.9–5.4)
GLUCOSE: 108 mg/dL (ref 74–109)
OSMOLALITY, CALCULATED: 287 mosm/kg (ref 270–290)
POTASSIUM: 4.1 mmol/L (ref 3.5–5.1)
PROTEIN TOTAL: 7.1 g/dL (ref 6.4–8.9)
SODIUM: 143 mmol/L (ref 136–145)

## 2023-03-21 LAB — CBC WITH DIFF
BASOPHIL #: 0.1 10*3/uL (ref 0.00–0.10)
BASOPHIL %: 1 % (ref 0–1)
EOSINOPHIL #: 0.2 10*3/uL (ref 0.00–0.50)
EOSINOPHIL %: 2 % (ref 1–8)
HCT: 42.4 % (ref 36.7–47.1)
HGB: 14.4 g/dL (ref 12.5–16.3)
LYMPHOCYTE #: 3.5 10*3/uL — ABNORMAL HIGH (ref 1.00–3.00)
LYMPHOCYTE %: 36 % (ref 16–44)
MCH: 31.5 pg (ref 23.8–33.4)
MCHC: 34 g/dL (ref 32.5–36.3)
MCV: 92.8 fL (ref 73.0–96.2)
MONOCYTE #: 0.6 10*3/uL (ref 0.30–1.00)
MONOCYTE %: 7 % (ref 5–13)
MPV: 9.4 fL (ref 7.4–11.4)
NEUTROPHIL #: 5.4 10*3/uL (ref 1.85–7.80)
NEUTROPHIL %: 55 % (ref 43–77)
PLATELETS: 254 10*3/uL (ref 140–440)
RBC: 4.56 10*6/uL (ref 4.06–5.63)
RDW: 14.3 % (ref 12.1–16.2)
WBC: 9.8 10*3/uL (ref 3.6–10.2)

## 2023-03-21 LAB — VITAMIN D 25 TOTAL: VITAMIN D 25, TOTAL: 70.33 ng/mL (ref 30.00–100.00)

## 2023-03-21 LAB — URIC ACID: URIC ACID: 4.5 mg/dL (ref 2.3–7.6)

## 2023-04-27 NOTE — Result Encounter Note (Signed)
Labs results have been reviewed by patient per epic.  Comprehensive panel-stable.  Cholesterol panel:  Looks good  CBC:  No significant findings-WBCs have come down since last tests.  Vitamin D level has increased slightly-this is not surprising giving his recent supplement of privilege Prevagen.  Discussed recommended vitamin-D level should be between 30 and 50 at his last appointment.  Uric acid level within normal limits

## 2023-05-08 ENCOUNTER — Other Ambulatory Visit: Payer: Self-pay

## 2023-05-08 ENCOUNTER — Other Ambulatory Visit (HOSPITAL_BASED_OUTPATIENT_CLINIC_OR_DEPARTMENT_OTHER): Payer: Medicare PPO

## 2023-05-08 ENCOUNTER — Encounter (HOSPITAL_BASED_OUTPATIENT_CLINIC_OR_DEPARTMENT_OTHER): Payer: Self-pay

## 2023-05-11 ENCOUNTER — Other Ambulatory Visit: Payer: Medicare PPO | Attending: Family

## 2023-05-11 ENCOUNTER — Other Ambulatory Visit: Payer: Self-pay

## 2023-05-11 DIAGNOSIS — K921 Melena: Secondary | ICD-10-CM | POA: Insufficient documentation

## 2023-05-11 LAB — OCCULT BLOOD, STOOL (IFOB): IFOB: NEGATIVE

## 2023-06-19 ENCOUNTER — Ambulatory Visit (RURAL_HEALTH_CENTER): Payer: Self-pay | Admitting: Family

## 2023-06-29 ENCOUNTER — Encounter (RURAL_HEALTH_CENTER): Payer: Self-pay | Admitting: Family

## 2023-06-29 ENCOUNTER — Other Ambulatory Visit: Payer: Self-pay

## 2023-06-29 ENCOUNTER — Ambulatory Visit (RURAL_HEALTH_CENTER): Payer: Medicare PPO | Attending: Family | Admitting: Family

## 2023-06-29 ENCOUNTER — Ambulatory Visit: Payer: Medicare PPO | Attending: Family | Admitting: Family

## 2023-06-29 VITALS — BP 125/69 | HR 60 | Temp 98.9°F | Resp 18 | Ht 67.0 in | Wt 167.1 lb

## 2023-06-29 DIAGNOSIS — N62 Hypertrophy of breast: Secondary | ICD-10-CM | POA: Insufficient documentation

## 2023-06-29 DIAGNOSIS — F411 Generalized anxiety disorder: Secondary | ICD-10-CM | POA: Insufficient documentation

## 2023-06-29 DIAGNOSIS — G4733 Obstructive sleep apnea (adult) (pediatric): Secondary | ICD-10-CM | POA: Insufficient documentation

## 2023-06-29 DIAGNOSIS — Z131 Encounter for screening for diabetes mellitus: Secondary | ICD-10-CM | POA: Insufficient documentation

## 2023-06-29 DIAGNOSIS — E559 Vitamin D deficiency, unspecified: Secondary | ICD-10-CM | POA: Insufficient documentation

## 2023-06-29 DIAGNOSIS — K219 Gastro-esophageal reflux disease without esophagitis: Secondary | ICD-10-CM | POA: Insufficient documentation

## 2023-06-29 DIAGNOSIS — I10 Essential (primary) hypertension: Secondary | ICD-10-CM | POA: Insufficient documentation

## 2023-06-29 DIAGNOSIS — E785 Hyperlipidemia, unspecified: Secondary | ICD-10-CM | POA: Insufficient documentation

## 2023-06-29 DIAGNOSIS — Z713 Dietary counseling and surveillance: Secondary | ICD-10-CM | POA: Insufficient documentation

## 2023-06-29 DIAGNOSIS — Z6826 Body mass index (BMI) 26.0-26.9, adult: Secondary | ICD-10-CM | POA: Insufficient documentation

## 2023-06-29 DIAGNOSIS — F17210 Nicotine dependence, cigarettes, uncomplicated: Secondary | ICD-10-CM | POA: Insufficient documentation

## 2023-06-29 DIAGNOSIS — I25708 Atherosclerosis of coronary artery bypass graft(s), unspecified, with other forms of angina pectoris: Secondary | ICD-10-CM | POA: Insufficient documentation

## 2023-06-29 DIAGNOSIS — M109 Gout, unspecified: Secondary | ICD-10-CM | POA: Insufficient documentation

## 2023-06-29 DIAGNOSIS — G8929 Other chronic pain: Secondary | ICD-10-CM | POA: Insufficient documentation

## 2023-06-29 DIAGNOSIS — M549 Dorsalgia, unspecified: Secondary | ICD-10-CM | POA: Insufficient documentation

## 2023-06-29 DIAGNOSIS — N400 Enlarged prostate without lower urinary tract symptoms: Secondary | ICD-10-CM | POA: Insufficient documentation

## 2023-06-29 DIAGNOSIS — Z716 Tobacco abuse counseling: Secondary | ICD-10-CM | POA: Insufficient documentation

## 2023-06-29 LAB — CBC WITH DIFF
BASOPHIL #: 0.1 10*3/uL (ref 0.00–0.10)
BASOPHIL %: 1 % (ref 0–1)
EOSINOPHIL #: 0.2 10*3/uL (ref 0.00–0.50)
EOSINOPHIL %: 2 % (ref 1–8)
HCT: 41.1 % (ref 36.7–47.1)
HGB: 13.9 g/dL (ref 12.5–16.3)
LYMPHOCYTE #: 3.2 10*3/uL — ABNORMAL HIGH (ref 1.00–3.00)
LYMPHOCYTE %: 31 % (ref 16–44)
MCH: 30.8 pg (ref 23.8–33.4)
MCHC: 33.8 g/dL (ref 32.5–36.3)
MCV: 91.2 fL (ref 73.0–96.2)
MONOCYTE #: 0.7 10*3/uL (ref 0.30–1.00)
MONOCYTE %: 7 % (ref 5–13)
MPV: 8.8 fL (ref 7.4–11.4)
NEUTROPHIL #: 6.1 10*3/uL (ref 1.85–7.80)
NEUTROPHIL %: 59 % (ref 43–77)
PLATELETS: 230 10*3/uL (ref 140–440)
RBC: 4.51 10*6/uL (ref 4.06–5.63)
RDW: 14 % (ref 12.1–16.2)
WBC: 10.4 10*3/uL — ABNORMAL HIGH (ref 3.6–10.2)

## 2023-06-29 LAB — COMPREHENSIVE METABOLIC PANEL, NON-FASTING
ALBUMIN/GLOBULIN RATIO: 1.6 — ABNORMAL HIGH (ref 0.8–1.4)
ALBUMIN: 4.6 g/dL (ref 3.5–5.7)
ALKALINE PHOSPHATASE: 102 U/L (ref 34–104)
ALT (SGPT): 23 U/L (ref 7–52)
ANION GAP: 15 mmol/L — ABNORMAL HIGH (ref 4–13)
AST (SGOT): 24 U/L (ref 13–39)
BILIRUBIN TOTAL: 0.4 mg/dL (ref 0.3–1.0)
BUN/CREA RATIO: 14 (ref 6–22)
BUN: 16 mg/dL (ref 7–25)
CALCIUM, CORRECTED: 8.9 mg/dL (ref 8.9–10.8)
CALCIUM: 9.4 mg/dL (ref 8.6–10.3)
CHLORIDE: 105 mmol/L (ref 98–107)
CO2 TOTAL: 24 mmol/L (ref 21–31)
CREATININE: 1.14 mg/dL (ref 0.60–1.30)
ESTIMATED GFR: 67 mL/min/{1.73_m2} (ref >59)
GLOBULIN: 2.8 (ref 2.0–3.5)
GLUCOSE: 97 mg/dL (ref 74–109)
OSMOLALITY, CALCULATED: 288 mosm/kg (ref 270–290)
POTASSIUM: 3.8 mmol/L (ref 3.5–5.1)
PROTEIN TOTAL: 7.4 g/dL (ref 6.4–8.9)
SODIUM: 144 mmol/L (ref 136–145)

## 2023-06-29 LAB — HGA1C (HEMOGLOBIN A1C WITH EST AVG GLUCOSE): HEMOGLOBIN A1C: 5.3 % (ref 4.0–6.0)

## 2023-06-29 LAB — LIPID PANEL
CHOL/HDL RATIO: 5.1
CHOLESTEROL: 107 mg/dL (ref <200)
HDL CHOL: 21 mg/dL — ABNORMAL LOW (ref >=40)
LDL CALC: 29 mg/dL (ref 0–100)
TRIGLYCERIDES: 287 mg/dL — ABNORMAL HIGH (ref <=150)
VLDL CALC: 57 mg/dL — ABNORMAL HIGH (ref 0–50)

## 2023-06-29 LAB — VITAMIN D 25 TOTAL: VITAMIN D 25, TOTAL: 53.14 ng/mL (ref 30.00–100.00)

## 2023-06-29 MED ORDER — FAMOTIDINE 40 MG TABLET
40.0000 mg | ORAL_TABLET | Freq: Two times a day (BID) | ORAL | 1 refills | Status: DC
Start: 2023-06-29 — End: 2023-10-03

## 2023-06-29 MED ORDER — TRAMADOL 50 MG TABLET
1.0000 | ORAL_TABLET | Freq: Four times a day (QID) | ORAL | 0 refills | Status: AC | PRN
Start: 2023-06-29 — End: 2023-08-06

## 2023-06-29 MED ORDER — SHINGRIX (PF) 50 MCG/0.5 ML INTRAMUSCULAR SUSPENSION, KIT
0.5000 mL | INHALATION_SUSPENSION | Freq: Once | INTRAMUSCULAR | 0 refills | Status: AC
Start: 2023-06-29 — End: 2023-06-29

## 2023-06-29 MED ORDER — METOPROLOL SUCCINATE ER 25 MG TABLET,EXTENDED RELEASE 24 HR
12.5000 mg | ORAL_TABLET | Freq: Every day | ORAL | 1 refills | Status: DC
Start: 2023-06-29 — End: 2023-10-03

## 2023-06-29 MED ORDER — RSVPREF3 ANTIGEN-AS01E ADJUVANT(PF) 120 MCG/0.5 ML IM SUSPENSION, KIT
0.5000 mL | INHALATION_SUSPENSION | Freq: Once | INTRAMUSCULAR | 0 refills | Status: AC
Start: 2023-06-29 — End: 2023-06-29

## 2023-06-29 MED ORDER — SERTRALINE 100 MG TABLET
100.0000 mg | ORAL_TABLET | Freq: Every evening | ORAL | 1 refills | Status: DC
Start: 2023-06-29 — End: 2023-10-03

## 2023-06-29 MED ORDER — ATORVASTATIN 40 MG TABLET
40.0000 mg | ORAL_TABLET | Freq: Every day | ORAL | 1 refills | Status: DC
Start: 2023-06-29 — End: 2023-10-03

## 2023-06-29 MED ORDER — AMLODIPINE 5 MG TABLET
5.0000 mg | ORAL_TABLET | Freq: Every day | ORAL | 1 refills | Status: DC
Start: 2023-06-29 — End: 2023-07-06

## 2023-06-29 MED ORDER — TAMSULOSIN 0.4 MG CAPSULE
0.4000 mg | ORAL_CAPSULE | Freq: Every evening | ORAL | 0 refills | Status: DC
Start: 2023-06-29 — End: 2023-10-03

## 2023-06-29 MED ORDER — OMEPRAZOLE 40 MG CAPSULE,DELAYED RELEASE
40.0000 mg | DELAYED_RELEASE_CAPSULE | Freq: Every morning | ORAL | 1 refills | Status: DC
Start: 2023-06-29 — End: 2023-10-03

## 2023-06-29 MED ORDER — PREVNAR 20 (PF) 0.5 ML INTRAMUSCULAR SYRINGE
0.5000 mL | INJECTION | Freq: Once | INTRAMUSCULAR | 0 refills | Status: AC
Start: 2023-06-29 — End: 2023-06-29

## 2023-06-29 NOTE — Assessment & Plan Note (Signed)
Stable. Will request updated clinical notes from Dr Alethia Berthold.

## 2023-06-29 NOTE — Assessment & Plan Note (Signed)
Currently being managed through Urology.  Will refill his Flomax.

## 2023-06-29 NOTE — Assessment & Plan Note (Addendum)
Well Controlled on amlodipine- However, on medication review his may be causing his gynecomastia.   Will continue the Calcium Channel Blocker and check a CMP, for now.  I am going to bring him back in next week and do some BP medication changes to see if this will help and relieve the gynecomastia.

## 2023-06-29 NOTE — Assessment & Plan Note (Addendum)
Remains an issue. We going to see if this is a result of his calcium channel blocker.  I have asked him to come back into the office next week-for medication changes.

## 2023-06-29 NOTE — Assessment & Plan Note (Signed)
Stable. He has as need colchicine to use at home for flares.

## 2023-06-29 NOTE — Assessment & Plan Note (Signed)
Will refill his Tramadol.  And monitor response to therapy.

## 2023-06-29 NOTE — Nursing Note (Signed)
Patient here today for 3 month follow-up and medication refills.

## 2023-06-29 NOTE — Assessment & Plan Note (Signed)
Last Vitamin D level was 70.3 in Oct 2024 And vitamin D supplement was stopped.   But he now taking Prevagen. I have encouraged him to switch to Neuriva- Which does not contain Vitamin D.   Will recheck a Vitamin D level today.

## 2023-06-29 NOTE — Assessment & Plan Note (Addendum)
Compliant with his CPAP.  Continue to monitor

## 2023-06-29 NOTE — Progress Notes (Signed)
FAMILY MEDICINE, [facility] FAMILY MEDICINE [facility]  [address]  [place] [address]  Operated by [facility]     Name: Corey [name] MRN:  [id number]   Date of Birth: Feb 17, 1946 Age: 78 y.o.   Date: 06/29/2023  Time: 09:32     Provider: Asa Lente, FNP    Reason for visit: Follow Up      History of Present Illness:  Corey [name] is a 78 y.o. male presents to the clinic for his 3 month chronic disease management appt. His wife is with him today:-)    HTN: He tells me it doing good. He not really watching his sodium, but he not using a lot at the table. Denies any cp, swelling, headaches, but he does have some occasional  SOB.   CAD:   He saw Dr Alethia Berthold recently.  He only taking 1/2 a pil as requested- metoprolol  Hyperlipidemia:  tolerating it well. - Fasting.   GERD: He denies any issues with reflux. He is taking his Omeprazole bid, and famotidine daily.   GAD: No issues.   Low back pain: Tramadol. - he needs a refill   OSA: He continues to use his CPAP consistently  Vitamin D: he was on Prevagen which has 2000 IUD a vitamin-D  AAA- Denies any chest pain.       Gout: No issues.   BPH: Post Aquablation. Per Mr Gismondi wife he is now on Myrbetriq and tamsulosin.    Gynecomastia: He tells me both breast are still very sore and uncomfortable.     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    AAA (abdominal aortic aneurysm) (CMS HCC) 02/13/2023    Gout attack 01/01/2023    GAD (generalized anxiety disorder) 09/14/2022    Coronary artery disease of bypass graft of native heart with stable angina pectoris (CMS HCC) 03/15/2022    History of constipation 03/15/2022    Positive colorectal cancer screening using Cologuard test 03/15/2022    Skin cancer 12/21/2021    Chronic back pain 12/21/2021    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    Essential hypertension 12/21/2021    Anxiety 12/21/2021    Osteoarthritis 12/21/2021     Vitamin D deficiency, unspecified 12/21/2021    Benign prostatic hyperplasia 12/21/2021    Current smoker 12/21/2021    Hyperlipidemia 08/29/2021    Heart murmur 08/29/2021    Obstructive sleep apnea treated with continuous positive airway pressure (CPAP) 08/29/2021    Right carotid bruit 08/29/2021    GERD (gastroesophageal reflux disease) 08/29/2021       Historical Data    Past Medical History:  Past Medical History:   Diagnosis Date    Abnormal laboratory test     Abnormal prostate specific antigen     Anxiety     Arteriosclerotic vascular disease     Atypical chest pain     BPH (benign prostatic hyperplasia)     Cancer (CMS HCC)     skin    Coronary artery disease     Depression     Diarrhea     Esophageal reflux     Fatigue     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    [place]  Dr Aleen Campi    Hypercholesterolemia     Hypertension     IBS (irritable bowel syndrome)     Left foot pain     Leukocytosis  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    Lumbar disc herniation with radiculopathy     Narcolepsy     Neck pain     Nephritis 12/21/2021    OSA (obstructive sleep apnea)     Osteoarthritis     Paresthesia     Previous back surgery     Right carotid bruit     Right hip pain     Thigh pain     Thyroid nodule     Weight loss, non-intentional      Past Surgical History:  Past Surgical History:   Procedure Laterality Date    CARDIAC CATHETERIZATION  07/13/2021    [id number]    Dr [facility]    DENTAL SURGERY      HX BACK SURGERY      HX COLONOSCOPY  2019    had done at [facility] [place] [facility]    HX CORONARY ARTERY BYPASS GRAFT  08/03/2021    Dr Aleen Campi [facility]    HX HERNIA REPAIR      HX LAP CHOLECYSTECTOMY      ORTHOPEDIC SURGERY      PROSTATE SURGERY  10/2022    Dr. Suzie Portela     Allergies:  Allergies   Allergen Reactions    Penicillins  Other Adverse Reaction (Add comment) and Rash     Childhood allergy, unknown reaction     Medications:  Current Outpatient Medications   Medication Sig    amLODIPine (NORVASC) 5 mg Oral Tablet Take 1  Tablet (5 mg total) by mouth Once a day for 180 days    aspirin (ECOTRIN) 81 mg Oral Tablet, Delayed Release (E.C.) Take 1 Tablet (81 mg total) by mouth Once a day    atorvastatin (LIPITOR) 40 mg Oral Tablet Take 1 Tablet (40 mg total) by mouth Once a day for 180 days    colchicine 0.6 mg Oral Tablet Take 2 Tablets (1.2 mg total) by mouth Once a day After 1 hour you can take an additional 0.6mg  Indications: acute inflammation of the joints due to gout attack    cyclobenzaprine (FLEXERIL) 10 mg Oral Tablet Take 1 Tablet (10 mg total) by mouth Three times a day    famotidine (PEPCID) 40 mg Oral Tablet Take 1 Tablet (40 mg total) by mouth Twice daily    metoprolol succinate (TOPROL-XL) 25 mg Oral Tablet Sustained Release 24 hr Take 0.5 Tablets (12.5 mg total) by mouth Once a day for 180 days Wean to 1/2 tablet, take whole tablet if heart rate is greater than 100    mirabegron (MYRBETRIQ) 25 mg Oral Tablet Sustained Release 24 hr Take 1 Tablet (25 mg total) by mouth Once a day    nitroGLYCERIN (NITROSTAT) 0.4 mg Sublingual Tablet, Sublingual DISSOLVE 1 TAB UNDER TONGUE EVERY 5 MINUTES AS NEEDED FOR CHEST PAIN. DO NOT EXCEED 3 DOSES IN 15 MINUTES.    omeprazole (PRILOSEC) 40 mg Oral Capsule, Delayed Release(E.C.) Take 1 Capsule (40 mg total) by mouth Every morning for 180 days Indications: gastroesophageal reflux disease    pneumoc 20-valent conjugate vaccine (PREVNAR 20, PF,) IntraMUSCULAR Syringe Inject 0.5 mL into the muscle One time for 1 dose    RSVPreF3 antigen-AS01E, PF, (AREXVY) 120 mcg/0.5 mL IntraMUSCULAR Suspension for Reconstitution Inject 0.5 mL into the muscle One time for 1 dose    sertraline (ZOLOFT) 100 mg Oral Tablet Take 1 Tablet (100 mg total) by mouth Every night for 180 days Indications: repeated episodes of anxiety    tamsulosin (FLOMAX) 0.4 mg  Oral Capsule Take 1 Capsule (0.4 mg total) by mouth Every evening after dinner for 90 days Indications: enlarged prostate with urination problem    traMADoL  (ULTRAM) 50 mg Oral Tablet Take 1 Tablet (50 mg total) by mouth Every 6 hours as needed for Pain for up to 30 days Indications: pain    varicella-zoster, PF, (SHINGRIX, PF,) 50 mcg/0.5 mL IntraMUSCULAR Suspension for Reconstitution Inject 0.5 mL into the muscle One time for 1 dose     Family History:  Family Medical History:       Problem Relation (Age of Onset)    Brain cancer Father    Heart Disease Mother    Hypertension (High Blood Pressure) Mother    No Known Problems Sister, Brother, Maternal Grandmother, Maternal Grandfather, Paternal Grandmother, Paternal Grandfather, Daughter, Son, Maternal Aunt, Maternal Uncle, Paternal Aunt, Paternal Uncle, Half-Brother, Half-Sister, Other            Social History:  Social History     Socioeconomic History    Marital status: Married     Spouse name: Myriam Jacobson    Number of children: 2    Years of education: 12+    Highest education level: Bachelor's degree (e.g., BA, AB, BS)   Tobacco Use    Smoking status: Every Day     Current packs/day: 1.00     Average packs/day: 1 pack/day for 56.0 years (56.0 ttl pk-yrs)     Types: Cigarettes     Start date: 1969    Smokeless tobacco: Never    Tobacco comments:     Counseled by A.Kynslie Ringle NP 03/15/22   Vaping Use    Vaping status: Never Used   Substance and Sexual Activity    Alcohol use: Yes     Alcohol/week: 3.0 standard drinks of alcohol     Types: 3 Cans of beer per week     Comment: occasionally    Drug use: Never     Social Determinants of Health     Financial Resource Strain: Low Risk  (06/15/2022)    Financial Resource Strain     SDOH Financial: No   Transportation Needs: Low Risk  (06/15/2022)    Transportation Needs     SDOH Transportation: No   Social Connections: Low Risk  (06/15/2022)    Social Connections     SDOH Social Isolation: 5 or more times a week   Intimate Partner Violence: Low Risk  (06/15/2022)    Intimate Partner Violence     SDOH Domestic Violence: No   Housing Stability: Low Risk  (06/15/2022)    Housing Stability      SDOH Housing Situation: I have housing.     SDOH Housing Worry: No           Review of Systems:  Any pertinent Review of Systems as addressed in the HPI above.    Physical Exam:  Vital Signs:  Vitals:    06/29/23 0844   BP: 125/69   Pulse: 60   Resp: 18   Temp: 37.2 C (98.9 F)   TempSrc: Temporal   SpO2: 96%   Weight: 75.8 kg (167 lb 2 oz)   Height: 1.702 m (5\' 7" )   BMI: 26.18         Physical Exam  Vitals reviewed.   Constitutional:       Appearance: Normal appearance.   HENT:      Head: Normocephalic and atraumatic.      Nose: Nose normal.  Mouth/Throat:      Mouth: Mucous membranes are moist.   Eyes:      General: No scleral icterus.     Conjunctiva/sclera: Conjunctivae normal.   Cardiovascular:      Rate and Rhythm: Normal rate and regular rhythm.      Pulses: Normal pulses.      Heart sounds: S1 normal and S2 normal. Murmur heard.      Systolic murmur is present with a grade of 3/6.      Comments: Ascending descending murmur heart best at RUSB.   Pulmonary:      Effort: Pulmonary effort is normal.      Breath sounds: Normal breath sounds. No wheezing, rhonchi or rales.   Abdominal:      Palpations: Abdomen is soft.   Musculoskeletal:         General: No swelling.      Right lower leg: No edema.      Left lower leg: No edema.   Skin:     General: Skin is warm.      Coloration: Skin is not cyanotic or pale.      Nails: There is no clubbing.   Neurological:      Mental Status: He is alert and oriented to person, place, and time. Mental status is at baseline.           Assessment/Plan:  (I10) Essential hypertension  (primary encounter diagnosis)  Plan: CBC/DIFF, COMPREHENSIVE METABOLIC PANEL,         NON-FASTING, LIPID PANEL, VITAMIN D 25 TOTAL,         HGA1C (HEMOGLOBIN A1C WITH EST AVG GLUCOSE)    (I25.708) Coronary artery disease of bypass graft of native heart with stable angina pectoris (CMS HCC)  Plan: CBC/DIFF, COMPREHENSIVE METABOLIC PANEL,         NON-FASTING, LIPID PANEL, VITAMIN D 25 TOTAL,          HGA1C (HEMOGLOBIN A1C WITH EST AVG GLUCOSE)    (E78.5) Hyperlipidemia, unspecified hyperlipidemia type  Plan: CBC/DIFF, COMPREHENSIVE METABOLIC PANEL,         NON-FASTING, LIPID PANEL, VITAMIN D 25 TOTAL,         HGA1C (HEMOGLOBIN A1C WITH EST AVG GLUCOSE)    (G47.33) Obstructive sleep apnea treated with continuous positive airway pressure (CPAP)  Plan: CBC/DIFF, COMPREHENSIVE METABOLIC PANEL,         NON-FASTING, LIPID PANEL, VITAMIN D 25 TOTAL,         HGA1C (HEMOGLOBIN A1C WITH EST AVG GLUCOSE)    (K21.9) Gastroesophageal reflux disease without esophagitis  Plan: CBC/DIFF, COMPREHENSIVE METABOLIC PANEL,         NON-FASTING, LIPID PANEL, VITAMIN D 25 TOTAL,         HGA1C (HEMOGLOBIN A1C WITH EST AVG GLUCOSE)    (E55.9) Vitamin D deficiency, unspecified  Plan: CBC/DIFF, COMPREHENSIVE METABOLIC PANEL,         NON-FASTING, LIPID PANEL, VITAMIN D 25 TOTAL,         HGA1C (HEMOGLOBIN A1C WITH EST AVG GLUCOSE)    (N40.0) Benign prostatic hyperplasia, unspecified whether lower urinary tract symptoms present  Plan: CBC/DIFF, COMPREHENSIVE METABOLIC PANEL,         NON-FASTING, LIPID PANEL, VITAMIN D 25 TOTAL,         HGA1C (HEMOGLOBIN A1C WITH EST AVG GLUCOSE)    (F41.1) GAD (generalized anxiety disorder)  Plan: CBC/DIFF, COMPREHENSIVE METABOLIC PANEL,         NON-FASTING, LIPID PANEL, VITAMIN D 25 TOTAL,  HGA1C (HEMOGLOBIN A1C WITH EST AVG GLUCOSE)    (M10.9) Gout attack  Plan: CBC/DIFF, COMPREHENSIVE METABOLIC PANEL,         NON-FASTING, LIPID PANEL, VITAMIN D 25 TOTAL,         HGA1C (HEMOGLOBIN A1C WITH EST AVG GLUCOSE)    (N62) Gynecomastia, male    (M54.9,  G16.29) Chronic back pain    (Z13.1) Diabetes mellitus screening  Plan: CBC/DIFF, COMPREHENSIVE METABOLIC PANEL,         NON-FASTING, LIPID PANEL, VITAMIN D 25 TOTAL,         HGA1C (HEMOGLOBIN A1C WITH EST AVG GLUCOSE)    (Z71.3) Nutritional counseling    (Z71.6) Encounter for tobacco use cessation counseling    (Z68.26) BMI 26.0-26.9,adult         Problem  List Items Addressed This Visit          Cardiovascular System    Hyperlipidemia         Last Lipid level was collected in Oct 23, 2022  Lab Results   Component Value Date    CHOLESTEROL 86 03/21/2023    HDLCHOL 22 (L) 03/21/2023    LDLCHOL 36 03/21/2023    TRIG 139 03/21/2023   Patient continues to take his statin, atorvastatin, consistently without issues.   I continue to educate him on the need for a heart healthy diet, low in saturated fat.            Relevant Orders    CBC/DIFF    COMPREHENSIVE METABOLIC PANEL, NON-FASTING    LIPID PANEL    VITAMIN D 25 TOTAL    HGA1C (HEMOGLOBIN A1C WITH EST AVG GLUCOSE)    Essential hypertension - Primary     Well Controlled on amlodipine- However, on medication review his may be causing his gynecomastia.   Will continue the Calcium Channel Blocker and check a CMP, for now.  I am going to bring him back in next week and do some BP medication changes to see if this will help and relieve the gynecomastia.          Relevant Orders    CBC/DIFF    COMPREHENSIVE METABOLIC PANEL, NON-FASTING    LIPID PANEL    VITAMIN D 25 TOTAL    HGA1C (HEMOGLOBIN A1C WITH EST AVG GLUCOSE)    Coronary artery disease of bypass graft of native heart with stable angina pectoris (CMS HCC)     Stable. Will request updated clinical notes from Dr Alethia Berthold.          Relevant Orders    CBC/DIFF    COMPREHENSIVE METABOLIC PANEL, NON-FASTING    LIPID PANEL    VITAMIN D 25 TOTAL    HGA1C (HEMOGLOBIN A1C WITH EST AVG GLUCOSE)       Respiratory    Obstructive sleep apnea treated with continuous positive airway pressure (CPAP)     Compliant with his CPAP.  Continue to monitor           Relevant Orders    CBC/DIFF    COMPREHENSIVE METABOLIC PANEL, NON-FASTING    LIPID PANEL    VITAMIN D 25 TOTAL    HGA1C (HEMOGLOBIN A1C WITH EST AVG GLUCOSE)       Digestive    GERD (gastroesophageal reflux disease)     Controlled on a H2 blocker, famotidine and a PPI, omeprazole.  Will continue the medications, Pepcid  daily,  and  omeprazole    Next EGD is due- I want him to have  a Colonoscopy at the same time- will continue to discuss this.   Will request clinical  notes from K Patel.          Relevant Orders    CBC/DIFF    COMPREHENSIVE METABOLIC PANEL, NON-FASTING    LIPID PANEL    VITAMIN D 25 TOTAL    HGA1C (HEMOGLOBIN A1C WITH EST AVG GLUCOSE)       Endocrine    Vitamin D deficiency, unspecified     Last Vitamin D level was 70.3 in Oct 2024 And vitamin D supplement was stopped.   But he now taking Prevagen. I have encouraged him to switch to Neuriva- Which does not contain Vitamin D.   Will recheck a Vitamin D level today.          Relevant Orders    CBC/DIFF    COMPREHENSIVE METABOLIC PANEL, NON-FASTING    LIPID PANEL    VITAMIN D 25 TOTAL    HGA1C (HEMOGLOBIN A1C WITH EST AVG GLUCOSE)       Musculoskeletal    Chronic back pain     Will refill his Tramadol.  And monitor response to therapy.            Urology    Benign prostatic hyperplasia     Currently being managed through Urology.  Will refill his Flomax.            Relevant Orders    CBC/DIFF    COMPREHENSIVE METABOLIC PANEL, NON-FASTING    LIPID PANEL    VITAMIN D 25 TOTAL    HGA1C (HEMOGLOBIN A1C WITH EST AVG GLUCOSE)       Psychiatric    GAD (generalized anxiety disorder)     Stable  Continue his SSRI, Sertraline.   Check a cbc and a cmp         Relevant Orders    CBC/DIFF    COMPREHENSIVE METABOLIC PANEL, NON-FASTING    LIPID PANEL    VITAMIN D 25 TOTAL    HGA1C (HEMOGLOBIN A1C WITH EST AVG GLUCOSE)       Rheumatologic    Gout attack     Stable. He has as need colchicine to use at home for flares.          Relevant Orders    CBC/DIFF    COMPREHENSIVE METABOLIC PANEL, NON-FASTING    LIPID PANEL    VITAMIN D 25 TOTAL    HGA1C (HEMOGLOBIN A1C WITH EST AVG GLUCOSE)       Obstetrics/Gynecology    Gynecomastia, male     Remains an issue. We going to see if this is a result of his calcium channel blocker.  I have asked him to come back into the office next week-for medication  changes.            Other Visit Diagnoses       Diabetes mellitus screening        Relevant Orders    CBC/DIFF    COMPREHENSIVE METABOLIC PANEL, NON-FASTING    LIPID PANEL    VITAMIN D 25 TOTAL    HGA1C (HEMOGLOBIN A1C WITH EST AVG GLUCOSE)    Nutritional counseling        Encounter for tobacco use cessation counseling        BMI 26.0-26.9,adult                   Orders Placed This Encounter    CBC/DIFF    COMPREHENSIVE METABOLIC PANEL, NON-FASTING  LIPID PANEL    VITAMIN D 25 TOTAL    HGA1C (HEMOGLOBIN A1C WITH EST AVG GLUCOSE)    CBC WITH DIFF    atorvastatin (LIPITOR) 40 mg Oral Tablet    tamsulosin (FLOMAX) 0.4 mg Oral Capsule    metoprolol succinate (TOPROL-XL) 25 mg Oral Tablet Sustained Release 24 hr    amLODIPine (NORVASC) 5 mg Oral Tablet    famotidine (PEPCID) 40 mg Oral Tablet    omeprazole (PRILOSEC) 40 mg Oral Capsule, Delayed Release(E.C.)    sertraline (ZOLOFT) 100 mg Oral Tablet    traMADoL (ULTRAM) 50 mg Oral Tablet    RSVPreF3 antigen-AS01E, PF, (AREXVY) 120 mcg/0.5 mL IntraMUSCULAR Suspension for Reconstitution    pneumoc 20-valent conjugate vaccine (PREVNAR 20, PF,) IntraMUSCULAR Syringe    varicella-zoster, PF, (SHINGRIX, PF,) 50 mcg/0.5 mL IntraMUSCULAR Suspension for Reconstitution        Depression screening is negative. PHQ 2 Total: 0     Tobacco cessation counseling performed.        Return in about 3 months (around 09/27/2023) for CDM and his annual  medicare wellness .      Huxton Glaus L Odile Veloso, FNP-C     Portions of this note may be dictated using voice recognition software or a dictation service. Variances in spelling and vocabulary are possible and unintentional. Not all errors are caught/corrected. Please notify the Thereasa Parkin if any discrepancies are noted or if the meaning of any statement is not clear.

## 2023-06-29 NOTE — Assessment & Plan Note (Signed)
Stable  Continue his SSRI, Sertraline.   Check a cbc and a cmp

## 2023-06-29 NOTE — Assessment & Plan Note (Signed)
Last Lipid level was collected in Oct 23, 2022  Lab Results   Component Value Date    CHOLESTEROL 86 03/21/2023    HDLCHOL 22 (L) 03/21/2023    LDLCHOL 36 03/21/2023    TRIG 139 03/21/2023   Patient continues to take his statin, atorvastatin, consistently without issues.   I continue to educate him on the need for a heart healthy diet, low in saturated fat.

## 2023-06-29 NOTE — Assessment & Plan Note (Addendum)
Controlled on a H2 blocker, famotidine and a PPI, omeprazole.  Will continue the medications, Pepcid  daily,  and omeprazole    Next EGD is due- I want him to have a Colonoscopy at the same time- will continue to discuss this.   Will request clinical  notes from K Patel.

## 2023-06-30 NOTE — Result Encounter Note (Signed)
Corey [name],  We you give Corey [name] a call regarding his lab results?  Comprehensive metabolic panel:  No indications of electrolyte imbalances.  Kidney function remains decreased, but stable- continue to stay well hydrated, avoid any NSAID products such as ibuprofen, Motrin, Alleve, Naprosyn, naproxen. Use only acetaminophen or his Tramadol for pain.   No indications of liver dysfunction.  Cholesterol panel:  Looks okay.  I do not think this is was a fasting sample-thus that is why his triglycerides are elevated.  His next cholesterol panel in March-I would recommend fasting.  CBC:  No significant findings  Let me know if they have any questions.   Corey [name]

## 2023-07-04 ENCOUNTER — Ambulatory Visit (RURAL_HEALTH_CENTER): Payer: Self-pay | Admitting: Family

## 2023-07-06 ENCOUNTER — Encounter (RURAL_HEALTH_CENTER): Payer: Self-pay | Admitting: Family

## 2023-07-06 ENCOUNTER — Other Ambulatory Visit: Payer: Self-pay

## 2023-07-06 ENCOUNTER — Ambulatory Visit (RURAL_HEALTH_CENTER): Payer: Medicare PPO | Attending: Family | Admitting: Family

## 2023-07-06 VITALS — BP 138/70 | HR 70 | Temp 98.7°F | Resp 18 | Ht 67.0 in | Wt 163.4 lb

## 2023-07-06 DIAGNOSIS — I10 Essential (primary) hypertension: Secondary | ICD-10-CM | POA: Insufficient documentation

## 2023-07-06 DIAGNOSIS — Z79899 Other long term (current) drug therapy: Secondary | ICD-10-CM | POA: Insufficient documentation

## 2023-07-06 DIAGNOSIS — F17210 Nicotine dependence, cigarettes, uncomplicated: Secondary | ICD-10-CM | POA: Insufficient documentation

## 2023-07-06 MED ORDER — LOSARTAN 25 MG TABLET
25.0000 mg | ORAL_TABLET | Freq: Two times a day (BID) | ORAL | 0 refills | Status: DC
Start: 2023-07-06 — End: 2023-08-06

## 2023-07-06 MED ORDER — AMLODIPINE 2.5 MG TABLET
2.5000 mg | ORAL_TABLET | Freq: Every day | ORAL | 0 refills | Status: DC
Start: 2023-07-06 — End: 2023-08-06

## 2023-07-06 NOTE — Nursing Note (Signed)
Patient here to follow-up on his blood pressure, patient states that he was put on new blood pressure medication.

## 2023-07-06 NOTE — Progress Notes (Signed)
FAMILY MEDICINE, [facility] FAMILY MEDICINE [facility]  [address]  [place] [address]  Operated by [facility]     Name: Corey [name] MRN:  [id number]   Date of Birth: [birth year] Age: 78 y.o.   Date: 07/06/2023  Time: 09:05     Provider: Asa Lente, FNP    Reason for visit: Follow Up      History of Present Illness:  Conal Shetley is a 78 y.o. male presents to the clinic for BP medication adjustments. He been struggling with gynecomastia and is on CCB. He cannot recall when or why he was started on amlodipine.   I have asked him in today so we can wean him off the amlodipine, in hopes of getting rid of the gynecomastia, which has become quite uncomfortable.       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    AAA (abdominal aortic aneurysm) (CMS HCC) 02/13/2023    Gout attack 01/01/2023    GAD (generalized anxiety disorder) 09/14/2022    Coronary artery disease of bypass graft of native heart with stable angina pectoris (CMS HCC) 03/15/2022    History of constipation 03/15/2022    Positive colorectal cancer screening using Cologuard test 03/15/2022    Skin cancer 12/21/2021    Chronic back pain 12/21/2021    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    Essential hypertension 12/21/2021    Anxiety 12/21/2021    Osteoarthritis 12/21/2021    Vitamin D deficiency, unspecified 12/21/2021    Benign prostatic hyperplasia 12/21/2021    Current smoker 12/21/2021    Hyperlipidemia 08/29/2021    Heart murmur 08/29/2021    Obstructive sleep apnea treated with continuous positive airway pressure (CPAP) 08/29/2021    Right carotid bruit 08/29/2021    GERD (gastroesophageal reflux disease) 08/29/2021       Historical Data    Past Medical History:  Past Medical History:   Diagnosis Date    Abnormal laboratory test     Abnormal prostate specific antigen     Anxiety     Arteriosclerotic vascular disease     Atypical chest pain     BPH  (benign prostatic hyperplasia)     Cancer (CMS HCC)     skin    Coronary artery disease     Depression     Diarrhea     Esophageal reflux     Fatigue     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    [place]  Dr Aleen Campi    Hypercholesterolemia     Hypertension     IBS (irritable bowel syndrome)     Left foot pain     Leukocytosis     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    Lumbar disc herniation with radiculopathy     Narcolepsy     Neck pain     Nephritis 12/21/2021    OSA (obstructive sleep apnea)     Osteoarthritis     Paresthesia     Previous back surgery     Right carotid bruit     Right hip pain     Thigh pain     Thyroid nodule     Weight loss, non-intentional      Past Surgical History:  Past Surgical History:   Procedure Laterality Date    CARDIAC CATHETERIZATION  07/13/2021    [id number]    Dr Elam Dutch    DENTAL  SURGERY      HX BACK SURGERY      HX COLONOSCOPY  2019    had done at [facility]    HX CORONARY ARTERY BYPASS GRAFT  08/03/2021    Dr Aleen Campi [facility]    HX HERNIA REPAIR      HX LAP CHOLECYSTECTOMY      ORTHOPEDIC SURGERY      PROSTATE SURGERY  10/2022    Dr. Suzie Portela     Allergies:  Allergies   Allergen Reactions    Penicillins  Other Adverse Reaction (Add comment) and Rash     Childhood allergy, unknown reaction     Medications:  Current Outpatient Medications   Medication Sig    amLODIPine (NORVASC) 2.5 mg Oral Tablet Take 1 Tablet (2.5 mg total) by mouth Once a day for 30 days    aspirin (ECOTRIN) 81 mg Oral Tablet, Delayed Release (E.C.) Take 1 Tablet (81 mg total) by mouth Once a day    atorvastatin (LIPITOR) 40 mg Oral Tablet Take 1 Tablet (40 mg total) by mouth Once a day for 180 days    colchicine 0.6 mg Oral Tablet Take 2 Tablets (1.2 mg total) by mouth Once a day After 1 hour you can take an additional 0.6mg  Indications: acute inflammation of the joints due to gout attack    cyclobenzaprine (FLEXERIL) 10 mg Oral Tablet Take 1 Tablet (10 mg total) by mouth Three times a day    famotidine (PEPCID) 40 mg  Oral Tablet Take 1 Tablet (40 mg total) by mouth Twice daily    losartan (COZAAR) 25 mg Oral Tablet Take 1 Tablet (25 mg total) by mouth Twice daily for 30 days Indications: high blood pressure    metoprolol succinate (TOPROL-XL) 25 mg Oral Tablet Sustained Release 24 hr Take 0.5 Tablets (12.5 mg total) by mouth Once a day for 180 days Wean to 1/2 tablet, take whole tablet if heart rate is greater than 100    mirabegron (MYRBETRIQ) 25 mg Oral Tablet Sustained Release 24 hr Take 1 Tablet (25 mg total) by mouth Once a day    nitroGLYCERIN (NITROSTAT) 0.4 mg Sublingual Tablet, Sublingual DISSOLVE 1 TAB UNDER TONGUE EVERY 5 MINUTES AS NEEDED FOR CHEST PAIN. DO NOT EXCEED 3 DOSES IN 15 MINUTES.    omeprazole (PRILOSEC) 40 mg Oral Capsule, Delayed Release(E.C.) Take 1 Capsule (40 mg total) by mouth Every morning for 180 days Indications: gastroesophageal reflux disease    sertraline (ZOLOFT) 100 mg Oral Tablet Take 1 Tablet (100 mg total) by mouth Every night for 180 days Indications: repeated episodes of anxiety    tamsulosin (FLOMAX) 0.4 mg Oral Capsule Take 1 Capsule (0.4 mg total) by mouth Every evening after dinner for 90 days Indications: enlarged prostate with urination problem    traMADoL (ULTRAM) 50 mg Oral Tablet Take 1 Tablet (50 mg total) by mouth Every 6 hours as needed for Pain for up to 30 days Indications: pain     Family History:  Family Medical History:       Problem Relation (Age of Onset)    Brain cancer Father    Heart Disease Mother    Hypertension (High Blood Pressure) Mother    No Known Problems Sister, Brother, Maternal Grandmother, Maternal Grandfather, Paternal Grandmother, Paternal Grandfather, Daughter, Son, Maternal Aunt, Maternal Uncle, Paternal Aunt, Paternal Uncle, Half-Brother, Half-Sister, Other            Social History:  Social History     Socioeconomic History  Marital status: Married     Spouse name: Myriam Jacobson    Number of children: 2    Years of education: 12+    Highest education  level: Bachelor's degree (e.g., BA, AB, BS)   Tobacco Use    Smoking status: Every Day     Current packs/day: 1.00     Average packs/day: 1 pack/day for 56.1 years (56.1 ttl pk-yrs)     Types: Cigarettes     Start date: 1969    Smokeless tobacco: Never    Tobacco comments:     Counseled by A.Terrez Ander NP 03/15/22   Vaping Use    Vaping status: Never Used   Substance and Sexual Activity    Alcohol use: Yes     Alcohol/week: 3.0 standard drinks of alcohol     Types: 3 Cans of beer per week     Comment: occasionally    Drug use: Never     Social Determinants of Health     Financial Resource Strain: Low Risk  (06/15/2022)    Financial Resource Strain     SDOH Financial: No   Transportation Needs: Low Risk  (06/15/2022)    Transportation Needs     SDOH Transportation: No   Social Connections: Low Risk  (06/15/2022)    Social Connections     SDOH Social Isolation: 5 or more times a week   Intimate Partner Violence: Low Risk  (06/15/2022)    Intimate Partner Violence     SDOH Domestic Violence: No   Housing Stability: Low Risk  (06/15/2022)    Housing Stability     SDOH Housing Situation: I have housing.     SDOH Housing Worry: No           Review of Systems:  Any pertinent Review of Systems as addressed in the HPI above.    Physical Exam:  Vital Signs:  Vitals:    07/06/23 0831   BP: 138/70   Pulse: 70   Resp: 18   Temp: 37.1 C (98.7 F)   TempSrc: Temporal   SpO2: 97%   Weight: 74.1 kg (163 lb 6 oz)   Height: 1.702 m (5\' 7" )   BMI: 25.59     Physical Exam  Vitals reviewed.   Constitutional:       Appearance: Normal appearance.   HENT:      Head: Normocephalic and atraumatic.   Eyes:      General: No scleral icterus.     Conjunctiva/sclera: Conjunctivae normal.   Cardiovascular:      Rate and Rhythm: Normal rate.   Pulmonary:      Effort: Pulmonary effort is normal.   Skin:     Coloration: Skin is not cyanotic or pale.   Neurological:      Mental Status: He is alert and oriented to person, place, and time. Mental status is at  baseline.          Assessment/Plan:  (I10) Essential hypertension  (primary encounter diagnosis)     Problem List Items Addressed This Visit          Cardiovascular System    Essential hypertension - Primary     Cut the amlodipine in half and start Losartan twice a day for 1 month.   He going to monitor his BP twice a day. - once in am, before meds and late afternoon.   He will let me know if his BP gets higher than 150/90 or if he has in symptoms.  Headaches, chest pain, racing heart rate, blurred vision, dizziness, headaches or develops SOB.             Orders Placed This Encounter    amLODIPine (NORVASC) 2.5 mg Oral Tablet    losartan (COZAAR) 25 mg Oral Tablet        Return in about 4 weeks (around 08/03/2023) for follow up BP medication adjustment.Asa Lente, FNP     Portions of this note may be dictated using voice recognition software or a dictation service. Variances in spelling and vocabulary are possible and unintentional. Not all errors are caught/corrected. Please notify the There[facility] Parkin if any discrepancies are noted or if the meaning of any statement is not clear.

## 2023-07-06 NOTE — Patient Instructions (Signed)
Overview of today:  Cut you amlodipine in half (now take 2.5mg )   Start Losartan 25 twice a day. ( Start now)  Monitor you BP twice  day and record for me.   Let me know if your BP is greater than 150/90

## 2023-07-06 NOTE — Assessment & Plan Note (Signed)
Cut the amlodipine in half and start Losartan twice a day for 1 month.   He going to monitor his BP twice a day. - once in am, before meds and late afternoon.   He will let me know if his BP gets higher than 150/90 or if he has in symptoms.   Headaches, chest pain, racing heart rate, blurred vision, dizziness, headaches or develops SOB.

## 2023-07-11 NOTE — Result Encounter Note (Signed)
Ernest Haber, MA  Asael Pann, [name], FNP  Informed patient here at appointment about lab results and recommendations. Patients states that he understand the recommendations and informed patient how to do a fasting sample for his blood work and patient states that he understands. Patients states that if he has any questions or concerns he would let us know.

## 2023-07-16 ENCOUNTER — Encounter (RURAL_HEALTH_CENTER): Payer: Self-pay | Admitting: Family

## 2023-08-06 ENCOUNTER — Ambulatory Visit (RURAL_HEALTH_CENTER): Payer: Medicare PPO | Attending: Family | Admitting: Family

## 2023-08-06 ENCOUNTER — Other Ambulatory Visit: Payer: Self-pay

## 2023-08-06 ENCOUNTER — Encounter (RURAL_HEALTH_CENTER): Payer: Self-pay | Admitting: Family

## 2023-08-06 VITALS — BP 122/72 | HR 84 | Temp 98.4°F | Ht 67.0 in | Wt 166.0 lb

## 2023-08-06 DIAGNOSIS — I10 Essential (primary) hypertension: Secondary | ICD-10-CM | POA: Insufficient documentation

## 2023-08-06 DIAGNOSIS — M25561 Pain in right knee: Secondary | ICD-10-CM | POA: Insufficient documentation

## 2023-08-06 MED ORDER — LOSARTAN 50 MG TABLET
50.0000 mg | ORAL_TABLET | Freq: Every day | ORAL | 0 refills | Status: DC
Start: 2023-08-06 — End: 2023-10-03

## 2023-08-06 MED ORDER — CLINDAMYCIN 150 MG/ML INJECTION SOLUTION
600.0000 mg | Freq: Once | INTRAMUSCULAR | Status: DC
Start: 2023-08-06 — End: 2023-08-06

## 2023-08-06 NOTE — Assessment & Plan Note (Signed)
 Will refer to ortho for further evaluation.   He is interested in a potential knee replacement.

## 2023-08-06 NOTE — Assessment & Plan Note (Addendum)
 Will stop the amlodipine and increase the Losartan 50 mg bid.   He will continue the Metoprolol 12.5mg  as prescribed by Cardiology.   I have encouraged them to purchase a wrist BP monitor for ease of measurement.   I asked him to let me know if his BP reading are consistently above 150/90's.  Or if he develops any symptoms of concern.     I will have nursing call and check on his BP readings next week.

## 2023-08-06 NOTE — Progress Notes (Signed)
 FAMILY MEDICINE, [facility] FAMILY MEDICINE [facility]  [address]  [place] [address]  Operated by [facility]     Name: Corey [name] MRN:  [id number]   Date of Birth: [birth year] Age: 78 y.o.   Date: 08/06/2023  Time: 15:49     Provider: Asa Lente, FNP    Reason for visit: Follow Up (Follow up after medication change and follow up blood pressure)      History of Present Illness:  Corey [name] is a 78 y.o. male presents to the clinic for a HTN follow up.   We are weaning him off his amlodipine due to gynecomastia.   At his last visit, we cut him to amlodipine 2.5mg  daily and started losartan 25 mg twice a day.   He taking his BP at home and his numbers are a little higher then we are getting here at the office. (Scan to chart.)  Systolic 160-170- most in the 644'I Diastolic 70's   He denies any issues with C.p, SOB, swelling, blurred vision , dizziness.   He tells me his R breast is not as sore, the soreness remains in L breast. And he feels like the swelling in both is better.     He currently takes Metoprolol 12.5 mg per cardiology.     He also having some issues with his R knee and wants a referral to see Dr Sharren Bridge for evaluation.       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) 03/15/2022    History of constipation 03/15/2022    Positive colorectal cancer screening using Cologuard test 03/15/2022    Skin cancer 12/21/2021    Chronic back pain 12/21/2021    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    Essential hypertension 12/21/2021    Anxiety 12/21/2021    Osteoarthritis 12/21/2021    Vitamin D deficiency, unspecified 12/21/2021    Benign prostatic  hyperplasia 12/21/2021    Current smoker 12/21/2021    Hyperlipidemia 08/29/2021    Heart murmur 08/29/2021    Obstructive sleep apnea treated with continuous positive airway pressure (CPAP) 08/29/2021    Right carotid bruit 08/29/2021    GERD (gastroesophageal reflux disease) 08/29/2021       Historical Data    Past Medical History:  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    [place]  Dr Aleen Campi    Hypercholesterolemia     Hypertension     IBS (irritable bowel syndrome)     Left foot pain 12/21/2021    Leukocytosis     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    Lumbar disc herniation  with radiculopathy 06/17/2018    Narcolepsy     Neck pain 12/21/2021    Nephritis 12/21/2021    OSA (obstructive sleep apnea)     Osteoarthritis     Paresthesia 12/21/2021    Previous back surgery 12/21/2021    Right carotid bruit     Right hip pain 12/21/2021    Thigh pain 12/21/2021    Thyroid nodule     Weight loss, non-intentional 12/21/2021     Past Surgical History:  Past Surgical History:   Procedure Laterality Date    CARDIAC CATHETERIZATION  07/13/2021    [id number]    Dr [facility]    DENTAL SURGERY      HX BACK SURGERY      HX COLONOSCOPY  2019    had done at [facility]    HX CORONARY ARTERY BYPASS GRAFT  08/03/2021    Dr Aleen Campi [facility]    HX HERNIA REPAIR      HX LAP CHOLECYSTECTOMY      ORTHOPEDIC SURGERY      PROSTATE SURGERY  10/2022    Dr. Suzie Portela     Allergies:  Allergies   Allergen Reactions    Penicillins  Other Adverse Reaction (Add comment) and Rash     Childhood allergy, unknown reaction     Medications:  Current Outpatient Medications   Medication Sig    aspirin (ECOTRIN) 81 mg Oral Tablet,  Delayed Release (E.C.) Take 1 Tablet (81 mg total) by mouth Once a day    atorvastatin (LIPITOR) 40 mg Oral Tablet Take 1 Tablet (40 mg total) by mouth Once a day for 180 days    cyclobenzaprine (FLEXERIL) 10 mg Oral Tablet Take 1 Tablet (10 mg total) by mouth Three times a day    famotidine (PEPCID) 40 mg Oral Tablet Take 1 Tablet (40 mg total) by mouth Twice daily    losartan (COZAAR) 50 mg Oral Tablet Take 1 Tablet (50 mg total) by mouth Once a day for 90 days Indications: high blood pressure    metoprolol succinate (TOPROL-XL) 25 mg Oral Tablet Sustained Release 24 hr Take 0.5 Tablets (12.5 mg total) by mouth Once a day for 180 days Wean to 1/2 tablet, take whole tablet if heart rate is greater than 100    mirabegron (MYRBETRIQ) 25 mg Oral Tablet Sustained Release 24 hr Take 1 Tablet (25 mg total) by mouth Once a day    nitroGLYCERIN (NITROSTAT) 0.4 mg Sublingual Tablet, Sublingual DISSOLVE 1 TAB UNDER TONGUE EVERY 5 MINUTES AS NEEDED FOR CHEST PAIN. DO NOT EXCEED 3 DOSES IN 15 MINUTES.    omeprazole (PRILOSEC) 40 mg Oral Capsule, Delayed Release(E.C.) Take 1 Capsule (40 mg total) by mouth Every morning for 180 days Indications: gastroesophageal reflux disease    sertraline (ZOLOFT) 100 mg Oral Tablet Take 1 Tablet (100 mg total) by mouth Every night for 180 days Indications: repeated episodes of anxiety    tamsulosin (FLOMAX) 0.4 mg Oral Capsule Take 1 Capsule (0.4 mg total) by mouth Every evening after dinner for 90 days Indications: enlarged prostate with urination problem    traMADoL (ULTRAM) 50 mg Oral Tablet Take 1 Tablet (50 mg total) by mouth Every 6 hours as needed for Pain for up to 30 days Indications: pain     Family History:  Family Medical History:       Problem Relation (Age of Onset)    Brain cancer Father    Heart Disease Mother    Hypertension (High Blood  Pressure) Mother    No Known Problems Sister, Brother, Maternal Grandmother, Maternal Grandfather, Paternal Grandmother, Paternal  Grandfather, Daughter, Son, Maternal Aunt, Maternal Uncle, Paternal Aunt, Paternal Uncle, Half-Brother, Half-Sister, Other            Social History:  Social History     Socioeconomic History    Marital status: Married     Spouse name: Corey [name]    Number of children: 2    Years of education: 12+    Highest education level: Bachelor's degree (e.g., BA, AB, BS)   Tobacco Use    Smoking status: Every Day     Current packs/day: 1.00     Average packs/day: 1 pack/day for 56.1 years (56.1 ttl pk-yrs)     Types: Cigarettes     Start date: 1969    Smokeless tobacco: Never    Tobacco comments:     Down to half a pack a day   Vaping Use    Vaping status: Never Used   Substance and Sexual Activity    Alcohol use: Yes     Alcohol/week: 3.0 standard drinks of alcohol     Types: 3 Cans of beer per week     Comment: occasionally    Drug use: Never     Social Determinants of Health     Financial Resource Strain: Low Risk  (06/15/2022)    Financial Resource Strain     SDOH Financial: No   Transportation Needs: Low Risk  (06/15/2022)    Transportation Needs     SDOH Transportation: No   Social Connections: Low Risk  (06/15/2022)    Social Connections     SDOH Social Isolation: 5 or more times a week   Intimate Partner Violence: Low Risk  (06/15/2022)    Intimate Partner Violence     SDOH Domestic Violence: No   Housing Stability: Low Risk  (06/15/2022)    Housing Stability     SDOH Housing Situation: I have housing.     SDOH Housing Worry: No           Review of Systems:  Any pertinent Review of Systems as addressed in the HPI above.    Physical Exam:  Vital Signs:  Vitals:    08/06/23 0945   BP: 122/72   Pulse: 84   Temp: 36.9 C (98.4 F)   TempSrc: Oral   SpO2: 93%   Weight: 75.3 kg (166 lb)   Height: 1.702 m (5\' 7" )   BMI: 26     Physical Exam  Vitals reviewed.   Constitutional:       Appearance: Normal appearance. He is well-developed, well-groomed and normal weight.   HENT:      Head: Normocephalic and atraumatic.   Eyes:      General: No  scleral icterus.     Conjunctiva/sclera: Conjunctivae normal.   Cardiovascular:      Rate and Rhythm: Normal rate and regular rhythm.      Heart sounds: S1 normal and S2 normal. Murmur heard.      Systolic murmur is present with a grade of 3/6.   Pulmonary:      Effort: Pulmonary effort is normal.      Breath sounds: Normal breath sounds and air entry. No wheezing, rhonchi or rales.   Musculoskeletal:      Right knee: Crepitus present. No swelling or deformity. Normal range of motion. Tenderness present. Normal pulse.      Right lower leg: No swelling. No edema.  Left lower leg: No edema.   Skin:     Coloration: Skin is not cyanotic or pale.   Neurological:      Mental Status: He is alert and oriented to person, place, and time. Mental status is at baseline.          Assessment/Plan:  (I10) Essential hypertension  (primary encounter diagnosis)    (M25.561) Right knee pain  Plan: Referral to External Provider (AMB)     Problem List Items Addressed This Visit          Cardiovascular System    Essential hypertension - Primary    Will stop the amlodipine and increase the Losartan 50 mg bid.   He will continue the Metoprolol 12.5mg  as prescribed by Cardiology.   I have encouraged them to purchase a wrist BP monitor for ease of measurement.   I asked him to let me know if his BP reading are consistently above 150/90's.  Or if he develops any symptoms of concern.     I will have nursing call and check on his BP readings next week.               Musculoskeletal    Right knee pain    Will refer to ortho for further evaluation.   He is interested in a potential knee replacement.            Relevant Orders    Referral to External Provider (AMB)       Orders Placed This Encounter    Referral to External Provider (AMB)    losartan (COZAAR) 50 mg Oral Tablet       Return for As scheduled. Marland KitchenApril 17th for his CDM.     Grayling Schranz L Cova Knieriem, FNP-C    Portions of this note may be dictated using voice recognition software or a dictation  service. Variances in spelling and vocabulary are possible and unintentional. Not all errors are caught/corrected. Please notify the There[facility] Parkin if any discrepancies are noted or if the meaning of any statement is not clear.

## 2023-09-12 ENCOUNTER — Other Ambulatory Visit (HOSPITAL_COMMUNITY): Payer: Self-pay | Admitting: Orthopaedic Surgery

## 2023-09-14 ENCOUNTER — Encounter (HOSPITAL_COMMUNITY): Payer: Self-pay

## 2023-09-27 ENCOUNTER — Other Ambulatory Visit: Payer: Self-pay

## 2023-09-27 ENCOUNTER — Encounter (RURAL_HEALTH_CENTER): Payer: Self-pay | Admitting: Family

## 2023-09-27 ENCOUNTER — Ambulatory Visit (RURAL_HEALTH_CENTER): Payer: Self-pay | Attending: Family | Admitting: Family

## 2023-09-27 VITALS — BP 147/91 | HR 75 | Temp 98.4°F | Wt 162.0 lb

## 2023-09-27 DIAGNOSIS — F17210 Nicotine dependence, cigarettes, uncomplicated: Secondary | ICD-10-CM | POA: Insufficient documentation

## 2023-09-27 DIAGNOSIS — J020 Streptococcal pharyngitis: Secondary | ICD-10-CM | POA: Insufficient documentation

## 2023-09-27 LAB — POCT RAPID COVID (SOFIA) (AMB ONLY): COVID-19 AG: NEGATIVE

## 2023-09-27 LAB — POCT RAPID FLU
INFLUENZA TYPE A: NEGATIVE
INFLUENZA TYPE B: NEGATIVE

## 2023-09-27 LAB — POCT RAPID STREP A: RAPID STREP A (POCT): POSITIVE

## 2023-09-27 MED ORDER — AZITHROMYCIN 250 MG TABLET
ORAL_TABLET | ORAL | 0 refills | Status: DC
Start: 2023-09-27 — End: 2023-10-03

## 2023-09-27 NOTE — Nursing Note (Signed)
 Patient complains of runny nose, cough, and sneezing since yesterday

## 2023-09-27 NOTE — Addendum Note (Signed)
 Addended by: Lytle Malburg [place] on: 09/27/2023 10:42 AM     Modules accepted: Orders

## 2023-09-27 NOTE — Progress Notes (Signed)
 FAMILY MEDICINE, [facility] FAMILY MEDICINE [facility]  [address]  [place] [address]  Operated by [facility]     Name: Corey [name] MRN:  [id number]   Date of Birth: [birth year] Age: 78 y.o.   Date: 09/27/2023  Time: 10:29     Provider: Guido Leeks, FNP    Reason for visit: Feel Sick      History of Present Illness:  Corey [name] is a 78 y.o. male presents to the clinic for his routine visit, but has a bad cold that started yesterday.   He tells me he just "sneezing"   Denies any sob, chest congestion, body aches or chills.       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    AAA (abdominal aortic aneurysm) 02/13/2023    Gout attack 01/01/2023    GAD (generalized anxiety disorder) 09/14/2022    Coronary artery disease of bypass graft of native heart with stable angina pectoris (CMS HCC) 03/15/2022    History of constipation 03/15/2022    Positive colorectal cancer screening using Cologuard test 03/15/2022    Skin cancer 12/21/2021    Chronic back pain 12/21/2021    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    Essential hypertension 12/21/2021    Anxiety 12/21/2021    Osteoarthritis 12/21/2021    Vitamin D deficiency, unspecified 12/21/2021    Benign prostatic hyperplasia 12/21/2021    Current smoker 12/21/2021    Hyperlipidemia 08/29/2021    Heart murmur 08/29/2021    Obstructive sleep apnea treated with continuous positive airway pressure (CPAP) 08/29/2021    Right carotid bruit 08/29/2021    GERD (gastroesophageal reflux disease) 08/29/2021       Historical Data    Past Medical History:  Past Medical History:   Diagnosis Date    Abnormal laboratory test     Abnormal prostate specific antigen     Anxiety     Arteriosclerotic vascular disease     Atypical chest pain     BPH (benign prostatic hyperplasia)     Cancer (CMS HCC)     skin    Coronary artery disease     Depression     Diarrhea      Esophageal reflux     Fatigue 08/29/2021    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    [place]  Dr Sheena Dennis    Hypercholesterolemia     Hypertension     IBS (irritable bowel syndrome)     Left foot pain 12/21/2021    Leukocytosis     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    Narcolepsy     Neck pain 12/21/2021    Nephritis 12/21/2021    OSA (obstructive sleep apnea)     Osteoarthritis     Paresthesia 12/21/2021    Previous back surgery 12/21/2021    Right carotid bruit     Right hip pain 12/21/2021    Thigh pain 12/21/2021    Thyroid nodule     Weight loss, non-intentional 12/21/2021     Past Surgical History:  Past Surgical History:   Procedure Laterality Date    CARDIAC CATHETERIZATION  07/13/2021    [id number]    Dr Mara [place] [facility]    DENTAL SURGERY      HX BACK SURGERY      HX COLONOSCOPY  2019    had done at [facility]    HX CORONARY ARTERY BYPASS GRAFT  08/03/2021    Dr Sheena Dennis [facility]    HX HERNIA REPAIR      HX LAP CHOLECYSTECTOMY      ORTHOPEDIC SURGERY      PROSTATE SURGERY  10/2022    Dr. Marlys Singh     Allergies:  Allergies   Allergen Reactions    Penicillins  Other Adverse Reaction (Add comment) and Rash     Childhood allergy, unknown reaction     Medications:  Current Outpatient Medications   Medication Sig    aspirin (ECOTRIN) 81 mg Oral Tablet, Delayed Release (E.C.) Take 1 Tablet (81 mg total) by mouth Daily    atorvastatin (LIPITOR) 40 mg Oral Tablet Take 1 Tablet (40 mg total) by mouth Once a day for 180 days    azithromycin (ZITHROMAX) 250 mg Oral Tablet Take 500 mg (2 tab) on day 1; take 250 mg (1 tab) on days 2-5.    cyclobenzaprine (FLEXERIL) 10 mg Oral Tablet Take 1 Tablet (10 mg total) by mouth Three times a day    famotidine (PEPCID) 40 mg Oral Tablet Take 1 Tablet (40 mg total) by mouth Twice daily    losartan (COZAAR) 50 mg Oral Tablet Take 1 Tablet (50 mg total) by mouth Once a day for 90 days Indications: high blood pressure    metoprolol succinate (TOPROL-XL) 25  mg Oral Tablet Sustained Release 24 hr Take 0.5 Tablets (12.5 mg total) by mouth Once a day for 180 days Wean to 1/2 tablet, take whole tablet if heart rate is greater than 100    mirabegron (MYRBETRIQ) 25 mg Oral Tablet Sustained Release 24 hr Take 1 Tablet (25 mg total) by mouth Daily    nitroGLYCERIN (NITROSTAT) 0.4 mg Sublingual Tablet, Sublingual DISSOLVE 1 TAB UNDER TONGUE EVERY 5 MINUTES AS NEEDED FOR CHEST PAIN. DO NOT EXCEED 3 DOSES IN 15 MINUTES.    omeprazole (PRILOSEC) 40 mg Oral Capsule, Delayed Release(E.C.) Take 1 Capsule (40 mg total) by mouth Every morning for 180 days Indications: gastroesophageal reflux disease    sertraline (ZOLOFT) 100 mg Oral Tablet Take 1 Tablet (100 mg total) by mouth Every night for 180 days Indications: repeated episodes of anxiety    tamsulosin (FLOMAX) 0.4 mg Oral Capsule Take 1 Capsule (0.4 mg total) by mouth Every evening after dinner for 90 days Indications: enlarged prostate with urination problem     Family History:  Family Medical History:       Problem Relation (Age of Onset)    Brain cancer Father    Heart Disease Mother    Hypertension (High Blood Pressure) Mother    No Known Problems Sister, Brother, Maternal Grandmother, Maternal Grandfather, Paternal Grandmother, Paternal Grandfather, Daughter, Son, Maternal Aunt, Maternal Uncle, Paternal Aunt, Paternal Uncle, Half-Brother, Half-Sister, Other            Social History:  Social History     Socioeconomic History    Marital status: Married     Spouse name: Aurora Blowers    Number of children: 2    Years of education: 12+    Highest education level: Bachelor's degree (e.g., BA, AB, BS)   Tobacco Use    Smoking status: Every Day     Current packs/day: 1.00     Average packs/day: 1 pack/day for 56.3 years (56.3 ttl pk-yrs)     Types: Cigarettes     Start date: 1969    Smokeless  tobacco: Never    Tobacco comments:     Down to half a pack a day   Vaping Use    Vaping status: Never Used   Substance and Sexual Activity     Alcohol use: Yes     Alcohol/week: 3.0 standard drinks of alcohol     Types: 3 Cans of beer per week     Comment: occasionally    Drug use: Never     Social Determinants of Health     Financial Resource Strain: Low Risk  (06/15/2022)    Financial Resource Strain     SDOH Financial: No   Transportation Needs: Low Risk  (06/15/2022)    Transportation Needs     SDOH Transportation: No   Social Connections: Low Risk  (06/15/2022)    Social Connections     SDOH Social Isolation: 5 or more times a week   Intimate Partner Violence: Low Risk  (06/15/2022)    Intimate Partner Violence     SDOH Domestic Violence: No   Housing Stability: Low Risk  (06/15/2022)    Housing Stability     SDOH Housing Situation: I have housing.     SDOH Housing Worry: No           Review of Systems:  Any pertinent Review of Systems as addressed in the HPI above.    Physical Exam:  Vital Signs:  Vitals:    09/27/23 0946   BP: (!) 147/91   Pulse: 75   Temp: 36.9 C (98.4 F)   SpO2: 98%   Weight: 73.5 kg (162 lb)     Physical Exam  Vitals reviewed.   Constitutional:       Appearance: Normal appearance. He is ill-appearing (but obviously does not feel well).   HENT:      Head: Normocephalic and atraumatic.      Right Ear: Tympanic membrane, ear canal and external ear normal. Tympanic membrane is not injected or bulging.      Left Ear: Tympanic membrane, ear canal and external ear normal. Tympanic membrane is not injected or bulging.      Nose: Mucosal edema, congestion and rhinorrhea present. Rhinorrhea is clear.      Mouth/Throat:      Lips: Pink.      Mouth: Mucous membranes are moist.      Pharynx: Pharyngeal swelling and posterior oropharyngeal erythema present. No oropharyngeal exudate.   Eyes:      Conjunctiva/sclera: Conjunctivae normal.   Cardiovascular:      Rate and Rhythm: Normal rate and regular rhythm.   Pulmonary:      Effort: Pulmonary effort is normal.      Breath sounds: Normal breath sounds. No wheezing, rhonchi or rales.   Abdominal:       Palpations: Abdomen is soft.   Musculoskeletal:      Cervical back: Neck supple. No muscular tenderness.   Lymphadenopathy:      Cervical: Cervical adenopathy present.   Skin:     General: Skin is warm.      Coloration: Skin is not cyanotic or pale.   Neurological:      Mental Status: He is alert and oriented to person, place, and time.          Assessment/Plan:  (J02.0) Strep pharyngitis  (primary encounter diagnosis)  Plan: POCT Rapid Covid [place]iver)     Tested for flu, COVID, strep:  Strep positive  Will treat with Zithromax.  Discussed antibiotic stewardship and the importance  of completing all of his antibiotics.  Reminded to perform saltwater gargles-and discard her toothbrush in 24 to 48 hours.  Push fluids and get need rest for healing.     Will bring him back next week for his routine visit.    Orders Placed This Encounter    POCT Rapid Covid ([place])    azithromycin (ZITHROMAX) 250 mg Oral Tablet         Return if symptoms worsen or fail to improve.    Peony Barner L Granvil Djordjevic, FNP-C    Portions of this note may be dictated using voice recognition software or a dictation service. Variances in spelling and vocabulary are possible and unintentional. Not all errors are caught/corrected. Please notify the [facility] if any discrepancies are noted or if the meaning of any statement is not clear.

## 2023-10-03 ENCOUNTER — Ambulatory Visit (RURAL_HEALTH_CENTER): Payer: Self-pay | Attending: Family | Admitting: Family

## 2023-10-03 ENCOUNTER — Other Ambulatory Visit: Payer: Self-pay

## 2023-10-03 ENCOUNTER — Encounter (RURAL_HEALTH_CENTER): Payer: Self-pay | Admitting: Family

## 2023-10-03 VITALS — BP 132/78 | HR 58 | Temp 98.0°F | Resp 18 | Ht 67.0 in | Wt 164.0 lb

## 2023-10-03 DIAGNOSIS — E559 Vitamin D deficiency, unspecified: Secondary | ICD-10-CM | POA: Insufficient documentation

## 2023-10-03 DIAGNOSIS — F17200 Nicotine dependence, unspecified, uncomplicated: Secondary | ICD-10-CM | POA: Insufficient documentation

## 2023-10-03 DIAGNOSIS — Z122 Encounter for screening for malignant neoplasm of respiratory organs: Secondary | ICD-10-CM | POA: Insufficient documentation

## 2023-10-03 DIAGNOSIS — G4733 Obstructive sleep apnea (adult) (pediatric): Secondary | ICD-10-CM | POA: Insufficient documentation

## 2023-10-03 DIAGNOSIS — F411 Generalized anxiety disorder: Secondary | ICD-10-CM | POA: Insufficient documentation

## 2023-10-03 DIAGNOSIS — F17210 Nicotine dependence, cigarettes, uncomplicated: Secondary | ICD-10-CM | POA: Insufficient documentation

## 2023-10-03 DIAGNOSIS — I10 Essential (primary) hypertension: Secondary | ICD-10-CM | POA: Insufficient documentation

## 2023-10-03 DIAGNOSIS — I25708 Atherosclerosis of coronary artery bypass graft(s), unspecified, with other forms of angina pectoris: Secondary | ICD-10-CM | POA: Insufficient documentation

## 2023-10-03 DIAGNOSIS — N62 Hypertrophy of breast: Secondary | ICD-10-CM | POA: Insufficient documentation

## 2023-10-03 DIAGNOSIS — K219 Gastro-esophageal reflux disease without esophagitis: Secondary | ICD-10-CM | POA: Insufficient documentation

## 2023-10-03 DIAGNOSIS — N400 Enlarged prostate without lower urinary tract symptoms: Secondary | ICD-10-CM | POA: Insufficient documentation

## 2023-10-03 DIAGNOSIS — E785 Hyperlipidemia, unspecified: Secondary | ICD-10-CM | POA: Insufficient documentation

## 2023-10-03 MED ORDER — NITROGLYCERIN 0.4 MG SUBLINGUAL TABLET
SUBLINGUAL_TABLET | SUBLINGUAL | 1 refills | Status: AC
Start: 2023-10-03 — End: ?

## 2023-10-03 MED ORDER — LOSARTAN 50 MG TABLET
50.0000 mg | ORAL_TABLET | Freq: Two times a day (BID) | ORAL | 1 refills | Status: DC
Start: 2023-10-03 — End: 2023-12-27

## 2023-10-03 MED ORDER — METOPROLOL SUCCINATE ER 25 MG TABLET,EXTENDED RELEASE 24 HR
12.5000 mg | ORAL_TABLET | Freq: Every day | ORAL | 1 refills | Status: DC
Start: 2023-10-03 — End: 2024-01-02

## 2023-10-03 MED ORDER — ATORVASTATIN 40 MG TABLET
40.0000 mg | ORAL_TABLET | Freq: Every day | ORAL | 1 refills | Status: DC
Start: 2023-10-03 — End: 2024-01-02

## 2023-10-03 MED ORDER — SERTRALINE 100 MG TABLET
100.0000 mg | ORAL_TABLET | Freq: Every evening | ORAL | 1 refills | Status: DC
Start: 2023-10-03 — End: 2024-01-02

## 2023-10-03 MED ORDER — FAMOTIDINE 40 MG TABLET
40.0000 mg | ORAL_TABLET | Freq: Two times a day (BID) | ORAL | 1 refills | Status: DC
Start: 2023-10-03 — End: 2024-01-02

## 2023-10-03 MED ORDER — OMEPRAZOLE 40 MG CAPSULE,DELAYED RELEASE
40.0000 mg | DELAYED_RELEASE_CAPSULE | Freq: Every morning | ORAL | 1 refills | Status: DC
Start: 2023-10-03 — End: 2024-04-02

## 2023-10-03 MED ORDER — TAMSULOSIN 0.4 MG CAPSULE
0.4000 mg | ORAL_CAPSULE | Freq: Every evening | ORAL | 0 refills | Status: DC
Start: 2023-10-03 — End: 2024-01-02

## 2023-10-03 NOTE — Assessment & Plan Note (Signed)
 Stable. Will request updated clinical notes from Dr Alethia Berthold.

## 2023-10-03 NOTE — Progress Notes (Addendum)
 [place], [place], [place]  Shalina Norfolk L, FNP  Called and informed patient to increases medication to twice  daily and to continue to monitor his BP. Patient states that he understands and he will increase his medication to twice a day          Previous Messages       ----- Message -----  From: Guido Leeks, FNP  Sent: 10/03/2023   6:44 PM EDT  To: Levan Razor, [place]    Okay, have him increase to twice a day and continue to monitor BP.  Thanks Asuzena Weis  ----- Message -----  From: Levan Razor, [place]  Sent: 10/03/2023   4:22 PM EDT  To: Guido Leeks, FNP    Patient's wife called me back and informed me that he is not taking amlodipine , but he is taking losartan  once daily and not twice a day.    Levan Razor, [place]  Corey [name] L, FNP  Patient's wife called me back and informed me that he is not taking amlodipine , but he is taking losartan  once daily and not twice a day.          Previous Messages       ----- Message -----  From: Guido Leeks, FNP  Sent: 10/03/2023   3:49 PM EDT  To: Levan Razor, [place]    Can you give Mrs Bomkamp a call. Once she at home.  2 things:  Let her know I talked to Nephrology and she good with me taking over her Mounjaro. I have already sent in the new prescription.  And I want her to look and make sure she she stopped [name] amlodipine .  Thanks Jamicia Haaland.  I    FAMILY MEDICINE, [facility]  [address]  [place] [address]  Operated by [facility]     Name: Corey [name] MRN:  [id number]   Date of Birth: 24-Jan-1946 Age: 78 y.o.   Date: 10/03/2023  Time: 15:56     Provider: Guido Leeks, FNP    Reason for visit: Follow Up      History of Present Illness:  Corey [name] is a 78 y.o. male presents to the clinic for his 3 month chronic disease management appt. His wife is with him today:-)    HTN: Presents me with a list of his BP. Denies any CP,  swelling or headaches. Mrs Caspar is here with him- she dispense his meds. She not  100% certain she stopped the amlodipine . But she will confirm when she gets home.   SOB. Occasional.   CAD:  He tells me he just saw Dr America Bake. No issue. He did not do an ECHO this time.   Hyperlipidemia:  He continues to take his medication.  He denies any stroke like symptoms.    GERD: He denies any issues.   GAD: No issues.   Low back pain: Tramadol .- no refill needed today, Maybe in a month or so.   OSA: He continues to use his CPAP consistently  Vitamin D : He still not taking the Vitamin D . And the switched to  Corey for memory supplement as recommended.Corey [name]   AAA- Denies any chest pain.         Gout: No issues.   BPH: He not been to see urology lately. He doing well. He still on the BPH medications.   Gynecomastia: The R breast is not sore but the [name] breast is still uncomfortable to  touch.     Lung Cancer Screening. He is aggreable. He smoking about 1/2 pack per day.     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    AAA (abdominal aortic aneurysm) 02/13/2023    Gout attack 01/01/2023    GAD (generalized anxiety disorder) 09/14/2022    Coronary artery disease of bypass graft of native heart with stable angina pectoris (CMS HCC) 03/15/2022    History of constipation 03/15/2022    Positive colorectal cancer screening using Cologuard test 03/15/2022    Skin cancer 12/21/2021    Chronic back pain 12/21/2021    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    Essential hypertension 12/21/2021    Anxiety 12/21/2021    Osteoarthritis 12/21/2021    Vitamin D  deficiency, unspecified 12/21/2021    Benign prostatic hyperplasia 12/21/2021    Current smoker 12/21/2021    Hyperlipidemia 08/29/2021    Heart murmur 08/29/2021    Obstructive sleep apnea treated with continuous positive airway pressure (CPAP) 08/29/2021    Right carotid bruit 08/29/2021    GERD (gastroesophageal reflux disease) 08/29/2021       Historical Data    Past Medical History:  Past  Medical History:   Diagnosis Date    Abnormal laboratory test     Abnormal prostate specific antigen     Anxiety     Arteriosclerotic vascular disease     Atypical chest pain     BPH (benign prostatic hyperplasia)     Cancer (CMS HCC)     skin    Coronary artery disease     Depression     Diarrhea     Esophageal reflux     Fatigue 08/29/2021    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    [place]  Dr Sheena Dennis    Hypercholesterolemia     Hypertension     IBS (irritable bowel syndrome)     Left foot pain 12/21/2021    Leukocytosis     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    Narcolepsy     Neck pain 12/21/2021    Nephritis 12/21/2021    OSA (obstructive sleep apnea)     Osteoarthritis     Paresthesia 12/21/2021    Previous back surgery 12/21/2021    Right carotid bruit     Right hip pain 12/21/2021    Thigh pain 12/21/2021    Thyroid nodule     Weight loss, non-intentional 12/21/2021     Past Surgical History:  Past Surgical History:   Procedure Laterality Date    CARDIAC CATHETERIZATION  07/13/2021    [id number]    Dr Mara Seminole Ward    DENTAL SURGERY      HX BACK SURGERY      HX COLONOSCOPY  2019    had done at [facility]    HX CORONARY ARTERY BYPASS GRAFT  08/03/2021    Dr Sheena Dennis [facility]    HX HERNIA REPAIR      HX LAP CHOLECYSTECTOMY      ORTHOPEDIC SURGERY      PROSTATE SURGERY  10/2022    Dr. Marlys Singh     Allergies:  Allergies   Allergen Reactions    Penicillins  Other Adverse Reaction (Add comment) and Rash     Childhood allergy, unknown reaction     Medications:  Current Outpatient Medications   Medication Sig  aspirin (ECOTRIN) 81 mg Oral Tablet, Delayed Release (E.C.) Take 1 Tablet (81 mg total) by mouth Daily    atorvastatin  (LIPITOR) 40 mg Oral Tablet Take 1 Tablet (40 mg total) by mouth Daily for 180 days    cyclobenzaprine  (FLEXERIL ) 10 mg Oral Tablet Take 1 Tablet (10 mg total) by mouth Three times a day    famotidine  (PEPCID ) 40 mg Oral Tablet Take 1 Tablet (40 mg total) by  mouth Twice daily    losartan  (COZAAR ) 50 mg Oral Tablet Take 1 Tablet (50 mg total) by mouth Twice daily for 180 days Indications: high blood pressure    metoprolol  succinate (TOPROL -XL) 25 mg Oral Tablet Sustained Release 24 hr Take 0.5 Tablets (12.5 mg total) by mouth Daily for 180 days Wean to 1/2 tablet, take whole tablet if heart rate is greater than 100    mirabegron (MYRBETRIQ) 25 mg Oral Tablet Sustained Release 24 hr Take 1 Tablet (25 mg total) by mouth Daily    nitroGLYCERIN  (NITROSTAT ) 0.4 mg Sublingual Tablet, Sublingual DISSOLVE 1 TAB UNDER TONGUE EVERY 5 MINUTES AS NEEDED FOR CHEST PAIN. DO NOT EXCEED 3 DOSES IN 15 MINUTES.    omeprazole  (PRILOSEC) 40 mg Oral Capsule, Delayed Release(E.C.) Take 1 Capsule (40 mg total) by mouth Every morning for 180 days Indications: gastroesophageal reflux disease    sertraline  (ZOLOFT ) 100 mg Oral Tablet Take 1 Tablet (100 mg total) by mouth Every night for 180 days Indications: repeated episodes of anxiety    tamsulosin  (FLOMAX ) 0.4 mg Oral Capsule Take 1 Capsule (0.4 mg total) by mouth Every evening after dinner for 90 days Indications: enlarged prostate with urination problem     Family History:  Family Medical History:       Problem Relation (Age of Onset)    Brain cancer Father    Heart Disease Mother    Hypertension (High Blood Pressure) Mother    No Known Problems Sister, Brother, Maternal Grandmother, Maternal Grandfather, Paternal Grandmother, Paternal Grandfather, Daughter, Son, Maternal Aunt, Maternal Uncle, Paternal Aunt, Paternal Uncle, Half-Brother, Half-Sister, Other            Social History:  Social History     Socioeconomic History    Marital status: Married     Spouse name: Aurora Blowers    Number of children: 2    Years of education: 12+    Highest education level: Bachelor's degree (e.g., BA, AB, BS)   Tobacco Use    Smoking status: Every Day     Current packs/day: 1.00     Average packs/day: 1 pack/day for 56.3 years (56.3 ttl pk-yrs)     Types:  Cigarettes     Start date: 1969    Smokeless tobacco: Never    Tobacco comments:     Down to half a pack a day   Vaping Use    Vaping status: Never Used   Substance and Sexual Activity    Alcohol use: Yes     Alcohol/week: 3.0 standard drinks of alcohol     Types: 3 Cans of beer per week     Comment: occasionally    Drug use: Never     Social Determinants of Health     Financial Resource Strain: Low Risk  (06/15/2022)    Financial Resource Strain     SDOH Financial: No   Transportation Needs: Low Risk  (06/15/2022)    Transportation Needs     SDOH Transportation: No   Social Connections: Low Risk  (06/15/2022)  Social Connections     SDOH Social Isolation: 5 or more times a week   Intimate Partner Violence: Low Risk  (06/15/2022)    Intimate Partner Violence     SDOH Domestic Violence: No   Housing Stability: Low Risk  (06/15/2022)    Housing Stability     SDOH Housing Situation: I have housing.     SDOH Housing Worry: No           Review of Systems:  Any pertinent Review of Systems as addressed in the HPI above.    Physical Exam:  Vital Signs:  Vitals:    10/03/23 1411   BP: 132/78   Pulse: 58   Resp: 18   Temp: 36.7 C (98 F)   TempSrc: Temporal   SpO2: 97%   Weight: 74.4 kg (164 lb)   Height: 1.702 m (5\' 7" )   BMI: 25.69         Physical Exam  Vitals reviewed.   Constitutional:       Appearance: Normal appearance.   HENT:      Head: Normocephalic and atraumatic.      Nose: Nose normal.      Mouth/Throat:      Mouth: Mucous membranes are moist.   Eyes:      General: No scleral icterus.     Conjunctiva/sclera: Conjunctivae normal.   Cardiovascular:      Rate and Rhythm: Normal rate and regular rhythm.      Pulses: Normal pulses.      Heart sounds: S1 normal and S2 normal. Murmur heard.      Systolic murmur is present with a grade of 3/6.      Comments: Ascending descending murmur heart best at RUSB.   Pulmonary:      Effort: Pulmonary effort is normal.      Breath sounds: Normal breath sounds. No wheezing, rhonchi or  rales.   Chest:      Comments: No obvious gynecomastia.  But the right nipple is sensitive to touch.  Continue to monitor  Abdominal:      Palpations: Abdomen is soft.   Musculoskeletal:         General: No swelling.      Right lower leg: No edema.      Left lower leg: No edema.   Skin:     General: Skin is warm.      Coloration: Skin is not cyanotic or pale.      Nails: There is no clubbing.   Neurological:      Mental Status: He is alert and oriented to person, place, and time. Mental status is at baseline.           Assessment/Plan:  (I25.708) Coronary artery disease of bypass graft of native heart with stable angina pectoris (CMS HCC)  (primary encounter diagnosis)  Plan: CBC/DIFF, COMPREHENSIVE METABOLIC PANEL,         NON-FASTING, LIPID PANEL, VITAMIN D  25 TOTAL    (I10) Essential hypertension  Plan: CBC/DIFF, COMPREHENSIVE METABOLIC PANEL,         NON-FASTING, LIPID PANEL, VITAMIN D  25 TOTAL    (E78.5) Hyperlipidemia, unspecified hyperlipidemia type  Plan: CBC/DIFF, COMPREHENSIVE METABOLIC PANEL,         NON-FASTING, LIPID PANEL, VITAMIN D  25 TOTAL    (F17.200) Current smoker  Plan: CBC/DIFF, COMPREHENSIVE METABOLIC PANEL,         NON-FASTING, LIPID PANEL, VITAMIN D  25 TOTAL,  CT LUNG CANCER SCREENING PROGRAM ANNUAL    [phone]) Obstructive sleep apnea treated with continuous positive airway pressure (CPAP)  Plan: CBC/DIFF, COMPREHENSIVE METABOLIC PANEL,         NON-FASTING, LIPID PANEL, VITAMIN D  25 TOTAL    (K21.9) Gastroesophageal reflux disease without esophagitis  Plan: CBC/DIFF, COMPREHENSIVE METABOLIC PANEL,         NON-FASTING, LIPID PANEL, VITAMIN D  25 TOTAL    (E55.9) Vitamin D  deficiency, unspecified  Plan: CBC/DIFF, COMPREHENSIVE METABOLIC PANEL,         NON-FASTING, LIPID PANEL, VITAMIN D  25 TOTAL    (N40.0) Benign prostatic hyperplasia, unspecified whether lower urinary tract symptoms present  Plan: CBC/DIFF, COMPREHENSIVE METABOLIC PANEL,         NON-FASTING, LIPID PANEL, VITAMIN D  25  TOTAL    (F41.1) GAD (generalized anxiety disorder)  Plan: CBC/DIFF, COMPREHENSIVE METABOLIC PANEL,         NON-FASTING, LIPID PANEL, VITAMIN D  25 TOTAL    (Z12.2) Encounter for screening for lung cancer  Plan: CT LUNG CANCER SCREENING PROGRAM ANNUAL    (N62) Gynecomastia, male           Problem List Items Addressed This Visit          Cardiovascular System    Hyperlipidemia        Last Lipid level was collected in Oct 23, 2022  Lab Results   Component Value Date    CHOLESTEROL 107 06/29/2023    HDLCHOL 21 ([name]) 06/29/2023    LDLCHOL 29 06/29/2023    TRIG 287 (H) 06/29/2023   Repeat his lipid level.   He going to return for fasting sample.   Patient continues to take his statin, atorvastatin , consistently without issues.   I continue to educate him on the need for a heart healthy diet, low in saturated fat.            Relevant Orders    CBC/DIFF    COMPREHENSIVE METABOLIC PANEL, NON-FASTING    LIPID PANEL    VITAMIN D  25 TOTAL    Essential hypertension    BP looks good today.  He given me a list of his BP readings, I will get those scanned in to the chart.   Will check a cbc and cmp  Continue his current antihypertensive, losartan - twice a day.   And continue to monitor           Relevant Orders    CBC/DIFF    COMPREHENSIVE METABOLIC PANEL, NON-FASTING    LIPID PANEL    VITAMIN D  25 TOTAL    Coronary artery disease of bypass graft of native heart with stable angina pectoris (CMS HCC) - Primary    Stable. Will request updated clinical notes from Dr America Bake.          Relevant Orders    CBC/DIFF    COMPREHENSIVE METABOLIC PANEL, NON-FASTING    LIPID PANEL    VITAMIN D  25 TOTAL       Respiratory    Obstructive sleep apnea treated with continuous positive airway pressure (CPAP)    Compliant with his CPAP.  Continue to monitor           Relevant Orders    CBC/DIFF    COMPREHENSIVE METABOLIC PANEL, NON-FASTING    LIPID PANEL    VITAMIN D  25 TOTAL    Current smoker    It is time for his yearly lung cancer screening and he is  in agreement to retesting.  Order placed. He is trying to cut back on his smoking  Encouragement always given.          Relevant Orders    CBC/DIFF    COMPREHENSIVE METABOLIC PANEL, NON-FASTING    LIPID PANEL    VITAMIN D  25 TOTAL    CT LUNG CANCER SCREENING PROGRAM ANNUAL       Digestive    GERD (gastroesophageal reflux disease)    Controlled on a H2 blocker, famotidine  and a PPI, omeprazole .  Will continue the medications, Pepcid   daily,  and omeprazole     I was able to get  clinical notes from Dr Alyssa Backbone reviewed: Dated  04/26/2023. Repeat EGD is not recommended until 3/20207  Will get a cbc and continue to follow.          Relevant Orders    CBC/DIFF    COMPREHENSIVE METABOLIC PANEL, NON-FASTING    LIPID PANEL    VITAMIN D  25 TOTAL       Endocrine    Vitamin D  deficiency, unspecified    Last Vitamin D  level was 53.4 in Jan He remains off the Vitamin D  supplement    Will recheck a Vitamin D  level and make recommendations based on findings.          Relevant Orders    CBC/DIFF    COMPREHENSIVE METABOLIC PANEL, NON-FASTING    LIPID PANEL    VITAMIN D  25 TOTAL       Urology    Benign prostatic hyperplasia    Currently being managed through Urology.  Will refill his Flomax .            Relevant Orders    CBC/DIFF    COMPREHENSIVE METABOLIC PANEL, NON-FASTING    LIPID PANEL    VITAMIN D  25 TOTAL       Psychiatric    GAD (generalized anxiety disorder)    Stable  Continue his SSRI, Sertraline .   Check a cbc and a cmp         Relevant Orders    CBC/DIFF    COMPREHENSIVE METABOLIC PANEL, NON-FASTING    LIPID PANEL    VITAMIN D  25 TOTAL       Obstetrics/Gynecology    Gynecomastia, male    Improving  I going to confirm that he no longer taking the amlodipine .   Will continue to monitor for improvement.   There is no obvious findings on exam and recent mammogram was ok.   Study Result    Narrative & Impression   Andry HARLEY Burgeson     MAMMO BILAT DIAGNOSTIC W TOMO-ADDL VWS/BREAST US  AS REQ BY RAD     CLINICAL HISTORY:   77  years.  Male.   N63.10: Mass of right breast  N64.4: Breast tenderness.     TECHNIQUE: Bilateral baseline male diagnostic digital breast tomosynthesis with C-View and 2D FFDM. Computer aided detection.      COMPARISON: None. Baseline exam.     FINDINGS:     There are scattered areas of fibroglandular density.  This study does not show any suspicious spiculated mass or architectural distortion. There is bilateral retroareolar tissue, which is much more prominent right than left. No suspicious calcifications or significant adenopathy is seen. CAD flags the right retroareolar tissue on CC. Tomogram does not show any focal mass, including right retroareolar.        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  NO FOCAL SUSPICIOUS LESION IS SEEN.     RECOMMENDATION:     CLINICAL FOLLOW-UP REGARDING UNDERLYING CAUSES OF GYNECOMASTIA.     ABSENCE OF DEFINITIVE IMAGING FINDINGS FOR MALIGNANCY SHOULD NOT RESULT IN DELAY OF BIOPSY OF ANY CLINICALLY SUSPICIOUS PALPABLE LESION. AT MINIMUM, RECOMMEND CAREFUL CLINICAL FOLLOW-UP OF THE AREA CONCERN WITH ADDITIONAL IMAGING IF NECESSARY.           BI-RADS 2:  BENIGN     RECOMMEND ANNUAL MAMMOGRAPHIC SCREENING.     DENSITY: B     PERFORMING FACILITY:  [facility]  [place], [place]  [address]  Phone [phone]             Other Visit Diagnoses         Encounter for screening for lung cancer        Relevant Orders    CT LUNG CANCER SCREENING PROGRAM ANNUAL                 Orders Placed This Encounter    CT LUNG CANCER SCREENING PROGRAM ANNUAL    CBC/DIFF    COMPREHENSIVE METABOLIC PANEL, NON-FASTING    LIPID PANEL    VITAMIN D  25 TOTAL    atorvastatin  (LIPITOR) 40 mg Oral Tablet    metoprolol  succinate (TOPROL -XL) 25 mg Oral Tablet Sustained Release 24 hr    famotidine  (PEPCID ) 40 mg Oral Tablet    omeprazole  (PRILOSEC) 40 mg Oral Capsule, Delayed Release(E.C.)     sertraline  (ZOLOFT ) 100 mg Oral Tablet    losartan  (COZAAR ) 50 mg Oral Tablet    tamsulosin  (FLOMAX ) 0.4 mg Oral Capsule    nitroGLYCERIN  (NITROSTAT ) 0.4 mg Sublingual Tablet, Sublingual        Depression screening is negative. PHQ 2 Total: 0     Tobacco cessation counseling performed.        Return in about 3 months (around 01/02/2024) for Chronic Disease Management.      Davidmichael Zarazua [name] Sarena Jezek, FNP-C     Portions of this note may be dictated using voice recognition software or a dictation service. Variances in spelling and vocabulary are possible and unintentional. Not all errors are caught/corrected. Please notify the [facility] if any discrepancies are noted or if the meaning of any statement is not clear.

## 2023-10-03 NOTE — Assessment & Plan Note (Signed)
Currently being managed through Urology.  Will refill his Flomax.

## 2023-10-03 NOTE — Assessment & Plan Note (Signed)
 It is time for his yearly lung cancer screening and he is in agreement to retesting.   Order placed. He is trying to cut back on his smoking  Encouragement always given.

## 2023-10-03 NOTE — Nursing Note (Signed)
 Patient here today for 3 month follow-up and medication refills.

## 2023-10-03 NOTE — Assessment & Plan Note (Signed)
 Compliant with his CPAP.  Continue to monitor

## 2023-10-03 NOTE — Assessment & Plan Note (Signed)
Stable  Continue his SSRI, Sertraline.   Check a cbc and a cmp

## 2023-10-03 NOTE — Assessment & Plan Note (Addendum)
 BP looks good today.  He given me a list of his BP readings, I will get those scanned in to the chart.   Will check a cbc and cmp  Continue his current antihypertensive, losartan - twice a day.   And continue to monitor

## 2023-10-03 NOTE — Assessment & Plan Note (Addendum)
 Controlled on a H2 blocker, famotidine  and a PPI, omeprazole .  Will continue the medications, Pepcid   daily,  and omeprazole     I was able to get  clinical notes from Dr Alyssa Backbone reviewed: Dated  04/26/2023. Repeat EGD is not recommended until 3/20207  Will get a cbc and continue to follow.

## 2023-10-03 NOTE — Assessment & Plan Note (Addendum)
 Last Vitamin D  level was 53.4 in Jan He remains off the Vitamin D  supplement    Will recheck a Vitamin D  level and make recommendations based on findings.

## 2023-10-03 NOTE — Assessment & Plan Note (Addendum)
 Last Lipid level was collected in Oct 23, 2022  Lab Results   Component Value Date    CHOLESTEROL 107 06/29/2023    HDLCHOL 21 (L) 06/29/2023    LDLCHOL 29 06/29/2023    TRIG 287 (H) 06/29/2023   Repeat his lipid level.   He going to return for fasting sample.   Patient continues to take his statin, atorvastatin , consistently without issues.   I continue to educate him on the need for a heart healthy diet, low in saturated fat.

## 2023-10-03 NOTE — Assessment & Plan Note (Signed)
 Improving  I going to confirm that he no longer taking the amlodipine .   Will continue to monitor for improvement.   There is no obvious findings on exam and recent mammogram was ok.   Study Result    Narrative & Impression   Corey [name]     MAMMO BILAT DIAGNOSTIC W TOMO-ADDL VWS/BREAST US  AS REQ BY RAD     CLINICAL HISTORY:   77 years.  Male.   N63.10: Mass of right breast  N64.4: Breast tenderness.     TECHNIQUE: Bilateral baseline male diagnostic digital breast tomosynthesis with C-View and 2D FFDM. Computer aided detection.      COMPARISON: None. Baseline exam.     FINDINGS:     There are scattered areas of fibroglandular density.  This study does not show any suspicious spiculated mass or architectural distortion. There is bilateral retroareolar tissue, which is much more prominent right than left. No suspicious calcifications or significant adenopathy is seen. CAD flags the right retroareolar tissue on CC. Tomogram does not show any focal mass, including right retroareolar.        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     NO FOCAL SUSPICIOUS LESION IS SEEN.     RECOMMENDATION:     CLINICAL FOLLOW-UP REGARDING UNDERLYING CAUSES OF GYNECOMASTIA.     ABSENCE OF DEFINITIVE IMAGING FINDINGS FOR MALIGNANCY SHOULD NOT RESULT IN DELAY OF BIOPSY OF ANY CLINICALLY SUSPICIOUS PALPABLE LESION. AT MINIMUM, RECOMMEND CAREFUL CLINICAL FOLLOW-UP OF THE AREA CONCERN WITH ADDITIONAL IMAGING IF NECESSARY.           BI-RADS 2:  BENIGN     RECOMMEND ANNUAL MAMMOGRAPHIC SCREENING.     DENSITY: B     PERFORMING FACILITY:  [facility]  [place], [place]  [address]  Phone [phone]

## 2023-10-08 ENCOUNTER — Other Ambulatory Visit: Payer: Self-pay

## 2023-10-08 ENCOUNTER — Ambulatory Visit: Attending: Family | Admitting: Family

## 2023-10-08 ENCOUNTER — Ambulatory Visit (RURAL_HEALTH_CENTER): Attending: Family

## 2023-10-08 DIAGNOSIS — F17200 Nicotine dependence, unspecified, uncomplicated: Secondary | ICD-10-CM | POA: Insufficient documentation

## 2023-10-08 DIAGNOSIS — I10 Essential (primary) hypertension: Secondary | ICD-10-CM | POA: Insufficient documentation

## 2023-10-08 DIAGNOSIS — E785 Hyperlipidemia, unspecified: Secondary | ICD-10-CM | POA: Insufficient documentation

## 2023-10-08 DIAGNOSIS — I25708 Atherosclerosis of coronary artery bypass graft(s), unspecified, with other forms of angina pectoris: Secondary | ICD-10-CM | POA: Insufficient documentation

## 2023-10-08 DIAGNOSIS — G4733 Obstructive sleep apnea (adult) (pediatric): Secondary | ICD-10-CM | POA: Insufficient documentation

## 2023-10-08 DIAGNOSIS — K219 Gastro-esophageal reflux disease without esophagitis: Secondary | ICD-10-CM | POA: Insufficient documentation

## 2023-10-08 DIAGNOSIS — N400 Enlarged prostate without lower urinary tract symptoms: Secondary | ICD-10-CM | POA: Insufficient documentation

## 2023-10-08 DIAGNOSIS — E559 Vitamin D deficiency, unspecified: Secondary | ICD-10-CM | POA: Insufficient documentation

## 2023-10-08 DIAGNOSIS — F411 Generalized anxiety disorder: Secondary | ICD-10-CM | POA: Insufficient documentation

## 2023-10-08 LAB — COMPREHENSIVE METABOLIC PANEL, NON-FASTING
ALBUMIN/GLOBULIN RATIO: 1.6 — ABNORMAL HIGH (ref 0.8–1.4)
ALBUMIN: 4.4 g/dL (ref 3.5–5.7)
ALKALINE PHOSPHATASE: 93 U/L (ref 34–104)
ALT (SGPT): 19 U/L (ref 7–52)
ANION GAP: 7 mmol/L (ref 4–13)
AST (SGOT): 22 U/L (ref 13–39)
BILIRUBIN TOTAL: 0.5 mg/dL (ref 0.3–1.0)
BUN/CREA RATIO: 16 (ref 6–22)
BUN: 18 mg/dL (ref 7–25)
CALCIUM, CORRECTED: 9 mg/dL (ref 8.9–10.8)
CALCIUM: 9.3 mg/dL (ref 8.6–10.3)
CHLORIDE: 108 mmol/L — ABNORMAL HIGH (ref 98–107)
CO2 TOTAL: 28 mmol/L (ref 21–31)
CREATININE: 1.1 mg/dL (ref 0.60–1.30)
ESTIMATED GFR: 70 mL/min/{1.73_m2} (ref >59)
GLOBULIN: 2.7 (ref 2.0–3.5)
GLUCOSE: 117 mg/dL — ABNORMAL HIGH (ref 74–109)
OSMOLALITY, CALCULATED: 288 mosm/kg (ref 270–290)
POTASSIUM: 3.6 mmol/L (ref 3.5–5.1)
PROTEIN TOTAL: 7.1 g/dL (ref 6.4–8.9)
SODIUM: 143 mmol/L (ref 136–145)

## 2023-10-08 LAB — LIPID PANEL
CHOL/HDL RATIO: 5.6
CHOLESTEROL: 107 mg/dL (ref <200)
HDL CHOL: 19 mg/dL — ABNORMAL LOW (ref >=40)
LDL CALC: 34 mg/dL (ref 0–100)
TRIGLYCERIDES: 270 mg/dL — ABNORMAL HIGH (ref <=150)
VLDL CALC: 54 mg/dL — ABNORMAL HIGH (ref 0–50)

## 2023-10-08 LAB — CBC WITH DIFF
BASOPHIL #: 0 10*3/uL (ref 0.00–0.10)
BASOPHIL %: 0 % (ref 0–1)
EOSINOPHIL #: 0.2 10*3/uL (ref 0.00–0.50)
EOSINOPHIL %: 2 % (ref 1–8)
HCT: 43.7 % (ref 36.7–47.1)
HGB: 14.8 g/dL (ref 12.5–16.3)
LYMPHOCYTE #: 3.5 10*3/uL — ABNORMAL HIGH (ref 1.00–3.20)
LYMPHOCYTE %: 35 % (ref 15–43)
MCH: 30.9 pg (ref 23.8–33.4)
MCHC: 33.9 g/dL (ref 32.5–36.3)
MCV: 91.2 fL (ref 73.0–96.2)
MONOCYTE #: 0.6 10*3/uL (ref 0.30–1.10)
MONOCYTE %: 6 % (ref 6–14)
MPV: 9.7 fL (ref 7.4–11.4)
NEUTROPHIL #: 5.5 10*3/uL (ref 1.70–7.60)
NEUTROPHIL %: 57 % (ref 44–74)
PLATELETS: 250 10*3/uL (ref 140–440)
RBC: 4.8 10*6/uL (ref 4.06–5.63)
RDW: 14.1 % (ref 12.1–16.2)
WBC: 9.8 10*3/uL (ref 3.6–10.2)

## 2023-10-08 LAB — VITAMIN D 25 TOTAL: VITAMIN D 25, TOTAL: 36.4 ng/mL (ref 30.00–100.00)

## 2023-10-09 ENCOUNTER — Ambulatory Visit
Admission: RE | Admit: 2023-10-09 | Discharge: 2023-10-09 | Disposition: A | Discharge status: Home / Self Care | Payer: Self-pay | Source: Ambulatory Visit | Attending: Orthopaedic Surgery | Admitting: Orthopaedic Surgery

## 2023-10-09 DIAGNOSIS — M25561 Pain in right knee: Secondary | ICD-10-CM | POA: Insufficient documentation

## 2023-10-15 ENCOUNTER — Other Ambulatory Visit (RURAL_HEALTH_CENTER): Payer: Self-pay | Admitting: Family

## 2023-10-19 ENCOUNTER — Other Ambulatory Visit (RURAL_HEALTH_CENTER): Payer: Self-pay | Admitting: Family

## 2023-10-23 ENCOUNTER — Ambulatory Visit (RURAL_HEALTH_CENTER): Payer: Self-pay

## 2023-10-23 ENCOUNTER — Other Ambulatory Visit (RURAL_HEALTH_CENTER): Payer: Self-pay | Admitting: Family

## 2023-10-23 ENCOUNTER — Ambulatory Visit (RURAL_HEALTH_CENTER): Payer: Self-pay | Admitting: Family

## 2023-10-23 MED ORDER — TRAMADOL 50 MG TABLET
1.0000 | ORAL_TABLET | Freq: Four times a day (QID) | ORAL | 0 refills | Status: DC | PRN
Start: 2023-10-23 — End: 2023-12-18

## 2023-10-27 ENCOUNTER — Ambulatory Visit (RURAL_HEALTH_CENTER): Payer: Self-pay | Admitting: Family

## 2023-10-27 MED ORDER — EZETIMIBE 10 MG TABLET
10.0000 mg | ORAL_TABLET | Freq: Every evening | ORAL | 0 refills | Status: DC
Start: 2023-10-27 — End: 2023-12-18

## 2023-10-27 NOTE — Result Encounter Note (Signed)
 Per Epic patient has reviewed lab results.   Kidney function remains reduced but stable.   Triglycerides are too high (this is a fasting sample) - Will prescribe and add Zetia to your cholesterol lowering therapy. Per chart review it appears you were on this medication back in 2011 as a combination medication with statin. So, obviously this has been an issue in the past. Try to eat a low fat diet, please.   Vitamin D  level is now back to safe levels. I think it would be safe to restart a Vitamin D  supplement. I recommending Vitamin D3 1000IU Take 2 tablets daily.

## 2023-11-05 ENCOUNTER — Other Ambulatory Visit: Payer: Self-pay

## 2023-11-05 ENCOUNTER — Emergency Department (HOSPITAL_BASED_OUTPATIENT_CLINIC_OR_DEPARTMENT_OTHER)

## 2023-11-05 ENCOUNTER — Encounter (HOSPITAL_BASED_OUTPATIENT_CLINIC_OR_DEPARTMENT_OTHER): Payer: Self-pay

## 2023-11-05 ENCOUNTER — Emergency Department
Admission: EM | Admit: 2023-11-05 | Discharge: 2023-11-05 | Disposition: A | Discharge status: Home / Self Care | Attending: FAMILY PRACTICE | Admitting: FAMILY PRACTICE

## 2023-11-05 DIAGNOSIS — K047 Periapical abscess without sinus: Secondary | ICD-10-CM | POA: Insufficient documentation

## 2023-11-05 MED ORDER — CLINDAMYCIN HCL 150 MG CAPSULE
300.0000 mg | ORAL_CAPSULE | Freq: Three times a day (TID) | ORAL | 0 refills | Status: DC
Start: 2023-11-05 — End: 2023-12-12

## 2023-11-05 NOTE — ED Nurses Note (Signed)
 Left upper pain and swelling to left side of face. Recent dental work.

## 2023-11-05 NOTE — ED Nurses Note (Signed)
Dr. Riki Sheer in to discuss DC planning

## 2023-11-05 NOTE — ED Nurses Note (Signed)
 Prescription x 1 e-scribed. Verbal and written instructions given. Pt and wife voice understanding. Pt DC home ambulatory.

## 2023-11-05 NOTE — ED Provider Notes (Signed)
 [facility], [facility] - Emergency Department  ED Primary Provider Note  History of Present Illness   Chief Complaint   Patient presents with    Dental Problem     Patient reports that he had a cap placed on his left upper tooth on Wednesday. Reports facial swelling and increased pain this am.     Corey [name] is a 78 y.o. male who had concerns including Dental Problem.  Arrival: The patient arrived by Car    This 78 year old male patient presents emergency department with tooth ache in the upper right lateral incisor with redness and swelling.  He had crowns placed on the right upper incisor across the tremor incisors to the left upper incisor last Wednesday, this morning he woke up with facial pain and swelling in the upper anterolateral right face.  He has no fevers, sweats or chills he has no sinus tract with drainage.  He has been on clindamycin  for tooth abscesses in the past, and has tolerated this well.  He had allergic to penicillins.  Past medical history is significant for pericarditis, coronary arthrosclerosis, hypertension and hyperlipidemia among other histories.  Past surgical history does include CABG, heart catheterization, prostate biopsy, colonoscopies, back surgery, cholecystectomy and herniorrhaphy.  Does smoke and drink socially with alcohol but denies any marijuana or illicit drug use.      Dental Problem    History Reviewed This Encounter:  Patient's past medical, surgical, social history reviewed noted.    Physical Exam   ED Triage Vitals [11/05/23 1106]   BP (Non-Invasive) 134/78   Heart Rate 66   Respiratory Rate 19   Temperature (!) 35.9 C (96.6 F)   SpO2 98 %   Weight 74.4 kg (164 lb)   Height 1.702 m (5\' 7" )     Physical Exam  General: No acute distress, nontoxic   Oral:  Oral mucosa is pink and moist, tooth 7. Does have some mild erythema around it, percussion tenderness on tooth 7., no pain in the dental ridge where tooth number 8 and 9 were located.  He  has no percussion tenderness to tooth 6.  No sinus tract was noted.  Neck: Supple with no adenopathy.    Patient Data   Labs Ordered/Reviewed - No data to display  No orders to display     Medical Decision Making        Medical Decision Making  Low complexity straightforward, dental abscess without sinus tract, tooth 7.    Risk  Prescription drug management.                   Clinical Impression   Periapical abscess without sinus (Primary)       Disposition: Discharged

## 2023-11-05 NOTE — Discharge Instructions (Addendum)
 You likely have a tooth abscess without sinus tract relieve the pressure.  You will be placed on clindamycin  300 mg 3 times a day for 10 days, you need to call your dental office tomorrow for referral to endodontist for evaluation and possible root canal.  You can use Tylenol, ibuprofen, Advil, Motrin, Naprosyn, or a leave whichever you like for pain if need be, the nonsteroidals will treat pain and inflammation, Tylenol will only treat pain.    To help reduce the risk of GI upset and diarrhea I recommend that you get probiotics, take them 3 times a day as directed on the bottle, to reduce your risk of antibiotic associated diarrhea.

## 2023-11-07 ENCOUNTER — Other Ambulatory Visit: Payer: Self-pay

## 2023-11-07 ENCOUNTER — Ambulatory Visit
Admission: RE | Admit: 2023-11-07 | Discharge: 2023-11-07 | Disposition: A | Discharge status: Home / Self Care | Payer: Self-pay | Source: Ambulatory Visit | Attending: Family | Admitting: Family

## 2023-11-07 DIAGNOSIS — F17200 Nicotine dependence, unspecified, uncomplicated: Secondary | ICD-10-CM

## 2023-11-07 DIAGNOSIS — R918 Other nonspecific abnormal finding of lung field: Secondary | ICD-10-CM

## 2023-11-07 DIAGNOSIS — F17210 Nicotine dependence, cigarettes, uncomplicated: Secondary | ICD-10-CM | POA: Insufficient documentation

## 2023-11-07 DIAGNOSIS — Z122 Encounter for screening for malignant neoplasm of respiratory organs: Secondary | ICD-10-CM

## 2023-11-07 NOTE — Result Encounter Note (Signed)
 [name],   Will you let Mr Cen know that his CT lung cancer screening was negative. No pulmonary nodules of concern.   Repeat in 1 year.   And please consider reducing his smoking.   Thanks Gloria Lambertson.

## 2023-11-12 NOTE — Result Encounter Note (Signed)
 Levan Razor, MA  Caroleann Casler, Rosi Converse, FNP  Called and spoke with patient about his Ct lung screening results and recommendations. Patient states that he understands recommendations. Patient states that if he has any questions or concerns he will let us  now.

## 2023-11-19 ENCOUNTER — Encounter (RURAL_HEALTH_CENTER): Payer: Self-pay | Admitting: Family

## 2023-11-19 ENCOUNTER — Other Ambulatory Visit: Payer: Self-pay

## 2023-11-19 ENCOUNTER — Ambulatory Visit (RURAL_HEALTH_CENTER): Payer: Self-pay | Attending: Family | Admitting: Family

## 2023-11-19 DIAGNOSIS — Z9181 History of falling: Secondary | ICD-10-CM | POA: Insufficient documentation

## 2023-11-19 DIAGNOSIS — Z0000 Encounter for general adult medical examination without abnormal findings: Secondary | ICD-10-CM | POA: Insufficient documentation

## 2023-11-19 DIAGNOSIS — Z7185 Encounter for immunization safety counseling: Secondary | ICD-10-CM | POA: Insufficient documentation

## 2023-11-19 DIAGNOSIS — Z5320 Procedure and treatment not carried out because of patient's decision for unspecified reasons: Secondary | ICD-10-CM | POA: Insufficient documentation

## 2023-11-19 MED ORDER — AREXVY (PF) 120 MCG/0.5 ML IM SUSPENSION
0.5000 mL | INHALATION_SUSPENSION | Freq: Once | INTRAMUSCULAR | 0 refills | Status: DC
Start: 2023-11-19 — End: 2023-12-12

## 2023-11-19 MED ORDER — SHINGRIX (PF) 50 MCG/0.5 ML INTRAMUSCULAR SUSPENSION, KIT
0.5000 mL | INHALATION_SUSPENSION | Freq: Once | INTRAMUSCULAR | 0 refills | Status: DC
Start: 2023-11-19 — End: 2023-12-12

## 2023-11-19 MED ORDER — PREVNAR 20 (PF) 0.5 ML INTRAMUSCULAR SYRINGE
0.5000 mL | INJECTION | Freq: Once | INTRAMUSCULAR | 0 refills | Status: DC
Start: 2023-11-19 — End: 2023-12-12

## 2023-11-19 NOTE — Progress Notes (Signed)
 FAMILY MEDICINE, [facility] FAMILY MEDICINE [facility]  [address]  [place] [address]  Operated by [facility]  Medicare Annual [place] Visit    Name: Corey [name] MRN:  [id number]   Date: 11/19/2023 Age: 78 y.o.     TELEPHONE VISIT DOCUMENTATION:    Patient's location: Home - [place] [address]   The patient has agreed to receive Annual Well Visit service with telephone communication (audio only).  In person office visit is not recommended or preferred due to [facility].  Patient unable to establish video communication with provider.    SUBJECTIVE:   Corey [name] is a 78 y.o. male seen for Medicare Annual Wellness exam via telephone visit.     I have reviewed and reconciled the medication list with the patient today.    aspirin    atorvastatin     cyclobenzaprine     ezetimibe     famotidine     losartan     metoprolol  succinate    mirabegron    nitroGLYCERIN     omeprazole     Prevnar 20  (PF)    Arexvy (PF)    sertraline     tamsulosin     traMADoL     Shingrix  (PF)        11/19/2023     4:40 PM 11/15/2022     8:08 AM   Comprehensive Health Assessment-Adult   Do you wish to complete this form? Yes Yes   During the past 4 weeks, how would you rate your health in general? Very Good Good   During the past 4 weeks, how much difficulty have you had doing your usual activities inside and outside your home because of medical or emotional problems? No difficulty at all No difficulty at all   During the past 4 weeks, was someone available to help you if you needed and wanted help? Yes, as much as I wanted Yes, as much as I wanted   In the past year, how many times have you gone to the emergency department or been admitted to a hospital for a health problem? 1 time None   Are you generally satisfied with your sleep? Yes Yes   Do you have enough money to buy things you need in everyday life, such as food, clothing, medicines, and housing? Yes, always Yes,  always   Can you get to places beyond walking distance without help?  (For example, can you drive your own car or travel alone on buses)? Yes Yes   Do you fasten your seatbelt when you are in a car? Yes, usually Yes, sometimes   Do you exercise 20 minutes 3 or more days per week (such as walking, dancing, biking, mowing grass, swimming)? Yes, most of the time Yes, most of the time   How often do you eat food that is healthy (fruits, vegetables, lean meats) instead of unhealthy (sweets, fast food, junk food, fatty foods)? Most of the time Some of the time   Have your parents, brothers or sisters had any of the following problems before the age of 16? (check all that apply) Heart problems, or hardening of the arteries;Cancer Cancer;Heart problems, or hardening of the arteries   How often do you have trouble taking medicines the eay you are told to take them? I always take them as prescribed I always take them as prescribed   Do you need any help communicating with your doctors and nurses because of vision or hearing problems? No No   During  the past 12 months, have you experienced confusion or memory loss that is happening more often or is getting worse? Yes       sometimes but has not gotten worse. No   Do you have one person you think of as your personal doctor (primary care provider or family doctor)? Yes Yes   If you are seeing a Primary Care Provider (PCP) or family doctor. please list their name Shronda Boeh FNP-C Prapti Grussing FNP-C   Are you now also seeing any specialist physician(s) (such as eye doctor, foot doctor, skin doctor)? Yes Yes   If you are seeing a specialist for anything such as foot, eye, skin, etc.  please list their name(s) Cardiology-dr. chandel, eye doctor-Dr. Blaydes, pulomonolgy-Dr. v. patel, gastro-K.Patel Cardiology-Chandel, Urology-Dr. Marlys Singh   How confident are you that you can control or manage most of your health problems? Very confident Very confident       I have reviewed and updated as  appropriate the past medical, family and social history. 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    Narcolepsy     Neck pain 12/21/2021    Nephritis 12/21/2021    OSA (obstructive sleep apnea)     Osteoarthritis     Paresthesia 12/21/2021    Previous back surgery 12/21/2021    Right carotid bruit     Right hip pain 12/21/2021    Thigh pain 12/21/2021    Thyroid nodule     Weight loss, non-intentional 12/21/2021     Past Surgical History:   Procedure Laterality Date    Cardiac catheterization  07/13/2021    Dental surgery      Hx back surgery      Hx colonoscopy  2019    Hx coronary artery bypass graft  08/03/2021    Hx hernia repair      Hx lap cholecystectomy      Orthopedic surgery      Prostate surgery  10/2022     Current Outpatient Medications   Medication Sig    aspirin (ECOTRIN) 81 mg Oral Tablet, Delayed Release (E.C.) Take 1 Tablet (81 mg total) by mouth Daily    atorvastatin  (LIPITOR) 40 mg Oral Tablet Take 1 Tablet (40 mg total) by mouth Daily for 180 days    cyclobenzaprine  (FLEXERIL ) 10 mg Oral Tablet Take 1 Tablet (10 mg total) by mouth Three times a day    ezetimibe  (ZETIA ) 10 mg Oral Tablet Take 1 Tablet (10 mg total) by mouth Every evening for 90 days Indications: High Trigylcerides.    famotidine  (PEPCID ) 40 mg Oral Tablet Take 1 Tablet (40 mg  total) by mouth Twice daily    losartan  (COZAAR ) 50  mg Oral Tablet Take 1 Tablet (50 mg total) by mouth Twice daily for 180 days Indications: high blood pressure    metoprolol  succinate (TOPROL -XL) 25 mg Oral Tablet Sustained Release 24 hr Take 0.5 Tablets (12.5 mg total) by mouth Daily for 180 days Wean to 1/2 tablet, take whole tablet if heart rate is greater than 100    mirabegron (MYRBETRIQ) 25 mg Oral Tablet Sustained Release 24 hr Take 1 Tablet (25 mg total) by mouth Daily    nitroGLYCERIN  (NITROSTAT ) 0.4 mg Sublingual Tablet, Sublingual DISSOLVE 1 TAB UNDER TONGUE EVERY 5 MINUTES AS NEEDED FOR CHEST PAIN. DO NOT EXCEED 3 DOSES IN 15 MINUTES.    omeprazole  (PRILOSEC) 40 mg Oral Capsule, Delayed Release(E.C.) Take 1 Capsule (40 mg total) by mouth Every morning for 180 days Indications: gastroesophageal reflux disease    pneumoc 20-valent conjugate vaccine (PREVNAR 20 , PF,) IntraMUSCULAR Syringe Inject 0.5 mL into the muscle One time for 1 dose    RSVPreF3 antigen-AS01E, PF, (AREXVY, PF,) 120 mcg/0.5 mL IntraMUSCULAR Suspension for Reconstitution Inject 0.5 mL into the muscle One time for 1 dose    sertraline  (ZOLOFT ) 100 mg Oral Tablet Take 1 Tablet (100 mg total) by mouth Every night for 180 days Indications: repeated episodes of anxiety    tamsulosin  (FLOMAX ) 0.4 mg Oral Capsule Take 1 Capsule (0.4 mg total) by mouth Every evening after dinner for 90 days Indications: enlarged prostate with urination problem    traMADoL  (ULTRAM ) 50 mg Oral Tablet Take 1 Tablet (50 mg total) by mouth Every 6 hours as needed for Pain for up to 30 days Indications: pain    varicella-zoster, PF, (SHINGRIX , PF,) 50 mcg/0.5 mL IntraMUSCULAR Suspension for Reconstitution Inject 0.5 mL into the muscle One time for 1 dose     Family Medical History:       Problem Relation (Age of Onset)    Brain cancer Father    Heart Disease Mother    Hypertension (High Blood Pressure) Mother    No Known Problems Sister, Brother, Maternal Grandmother, Maternal Grandfather, Paternal Grandmother,  Paternal Grandfather, Daughter, Son, Maternal Aunt, Maternal Uncle, Paternal Aunt, Paternal Uncle, Half-Brother, Half-Sister, Other            Social History     Socioeconomic History    Marital status: Married     Spouse name: Aurora Blowers    Number of children: 2    Years of education: 12+    Highest education level: Bachelor's degree (e.g., BA, AB, BS)   Tobacco Use    Smoking status: Every Day     Current packs/day: 1.00     Average packs/day: 1 pack/day for 56.4 years (56.4 ttl pk-yrs)     Types: Cigarettes     Start date: 1969    Smokeless tobacco: Never    Tobacco comments:     Down to half a pack a day   Vaping Use    Vaping status: Never Used   Substance and Sexual Activity    Alcohol use: Yes     Alcohol/week: 3.0 standard drinks of alcohol     Types: 3 Cans of beer per week     Comment: occasionally    Drug use: Never     Social Determinants of Health     Financial Resource Strain: Low Risk  (06/15/2022)    Financial Resource Strain     SDOH Financial: No   Transportation Needs:  Low Risk  (06/15/2022)    Transportation Needs     SDOH Transportation: No   Social Connections: Low Risk  (06/15/2022)    Social Connections     SDOH Social Isolation: 5 or more times a week   Intimate Partner Violence: Low Risk  (06/15/2022)    Intimate Partner Violence     SDOH Domestic Violence: No   Housing Stability: Low Risk  (06/15/2022)    Housing Stability     SDOH Housing Situation: I have housing.     SDOH Housing Worry: No   Health Literacy: Low Risk  (03/19/2023)    Health Literacy     SDOH Health Literacy: Never   Employment Status: Low Risk  (06/15/2022)    Employment Status     SDOH Employment: Otherwise unemployed but not seeking work (ex. Consulting civil engineer, retired, disabled, unpaid primary care giver)         List of Current Health Care Providers   Care Team       PCP       Name Type Specialty Phone Number    Ogden, Rosi Converse, FNP Nurse Practitioner FAMILY NURSE PRACTITIONER [phone]              Care Team       Name Type Specialty Phone  Number    Madelyn Schick, MD Not available CARDIOVASCULAR DISEASE [phone]    Donel Fujisawa, DO Not available UROLOGY [phone]    Aurora Blowers, MD Physician PULMONARY DISEASE [phone]    Wenceslao Haller, MD Physician GASTROENTEROLOGY [phone]                      Health Maintenance   Topic Date Due    Hepatitis C screening  Never done    Shingles Vaccine (1 of 2) Never done    Pneumococcal Vaccination, Age 78+ (2 of 2 - PCV) 03/14/2014    RSV Adult 60+ or Pregnancy (1 - 1-dose 75+ series) Never done    Covid-19 Vaccine (4 - 2024-25 season) 02/11/2023    Medicare Annual Wellness Visit - Calendar Year Insurers  06/13/2023    CT Lung Cancer Screening  11/06/2024    Depression Screening  11/18/2024    Adult Tdap-Td (2 - Td or Tdap) 03/15/2032    Influenza Vaccine  Completed     Medicare Wellness Assessment   Medicare initial or wellness physical in the last year?: No  Advance Directives   Does patient have a living will or MPOA: No           Advance directive information given to the patient today?: Patient Declined (Patient already has paper work.)      Activities of Daily Living   Do you need help with dressing, bathing, or walking?: No   Do you need help with shopping, housekeeping, medications, or finances?: No   Do you have rugs in hallways, broken steps, or poor lighting?: No   Do you have grab bars in your bathroom, non-slip strips in your tub, and hand rails on your stairs?: Yes   Cognitive Function Screen (1=Yes, 0=No)   What is you age?: Correct   What is the time to the nearest hour?: Correct   What is the year?: Correct   What is the name of this clinic?: Incorrect   Can the patient recognize two persons (the doctor, the nurse, home help, etc.)?: Correct   What is the date of your birth? (day and month sufficient) : Correct  In what year did World War II end?: Correct   Who is the current president of the [place] ?: Correct   Count from 20 down to 1?: Correct   What address  did I give you earlier?: Correct   Total Score: 9   Interpretation of Total Score: Greater than 6 Normal   Fall Risk Screen   Do you feel unsteady when standing or walking?: Yes (sometimes)  Do you worry about falling?: No  Have you fallen in the past year?: Yes  How many times have you fallen?: 2 or more times  Were you ever injured from falling?: No   Depression Screen     Little interest or pleasure in doing things.: Not at all  Feeling down, depressed, or hopeless: Not at all  PHQ 2 Total: 0     Pain Score   Pain Score:   0 - No pain    Substance Use-Abuse Screening     Tobacco Use     In Past 12 MONTHS, how often have you used any tobacco product (for example, cigarettes, e-cigarettes, cigars, pipes, or smokeless tobacco)?: Daily  In the PAST 3 MONTHS, did you smoke a cigarette containing tobacco or use any other nicotine delivery product (i.e., e-cigarette, vaping or chewing tobacco)?: Yes  In the PAST 3 MONTHS, did you usually smoke more than 10 cigarettes, vape, use an e-cigarette or chew tobacco more than 10 times each day?: Yes  In the PAST 3 MONTHS, did you usually smoke/use an e-cigarette, vape or chew tobacco within 30 minutes after waking?: Yes     Alcohol use     In the PAST 12 MONTHS, how often have you had 5 (men)/4 (women) or more drinks containing alcohol in one day?: Never     Prescription Drug Use     In the PAST 12 months, how often have you used any prescription medications just for the feeling, more than prescribed, or that were not prescribed for you? Prescriptions may include: opioids, benzodiazepines, medications for ADHD: Never           Illicit Drug Use   In the PAST 12 MONTHS, how often have you used any drugs, including marijuana, cocaine or crack, heroin, methamphetamine, hallucinogens, ecstasy/MDMA?: Never            Urine Incontinence Screen   Urinary Incontinence Screen  Do you ever leak urine when you don't want to?: No         OBJECTIVE:   Unable to obtain Blood pressure  measurement, Height, Weight via video visit.  Patient-reported Blood Pressure: no method to measure  Patient-reported Weight: no method to measure  Patient-reported Height: no method to measure  Patient-reported waist circumference: no method to measure  BMI unable to be calculated as patient was unable to provide the necessary data.    EXAM:  None    Health Maintenance Due   Topic Date Due    Hepatitis C screening  Never done    Shingles Vaccine (1 of 2) Never done    Pneumococcal Vaccination, Age 47+ (2 of 2 - PCV) 03/14/2014    RSV Adult 60+ or Pregnancy (1 - 1-dose 75+ series) Never done    Covid-19 Vaccine (4 - 2024-25 season) 02/11/2023    Medicare Annual Wellness Visit - Calendar Year Insurers  06/13/2023      ASSESSMENT & PLAN:     ICD-10-CM    1. Medicare annual wellness visit, subsequent  Z00.00  2. At moderate risk for fall  Z91.81       3. Vaccine counseling  Z71.85       4. Colonoscopy refused  Z53.20           Identified Risk Factors/ Recommended Actions     Fall Risk Follow up plan of care: Discussed optimizing home safety  Footwear and potential problems addressed - Recent issue with falling while doing some work at his daughter's home.  No significant injuries per patient.   The PHQ 2 Total: 0 depression screen is interpreted as negative.       Opioids use:  Pain Agreement on File for prn Tramadol .     Patient declined Advanced Directives information.    Patient declines colonoscopy- he had a positive Cologuard in 03-02-2022    Orders Placed This Encounter    varicella-zoster, PF, (SHINGRIX , PF,) 50 mcg/0.5 mL IntraMUSCULAR Suspension for Reconstitution    pneumoc 20-valent conjugate vaccine (PREVNAR 20 , PF,) IntraMUSCULAR Syringe    RSVPreF3 antigen-AS01E, PF, (AREXVY, PF,) 120 mcg/0.5 mL IntraMUSCULAR Suspension for Reconstitution          The patient has been educated about risk factors and recommended preventive care. Written Prevention Plan completed/updated in "Patient Instructions" in  After Visit Summary. After Visit Summary is shared with the patient in MyChart patient portal.  After Visit Summary has  been MAILED to patient's home.    Levan Razor, [place]    I personally saw the patient. See nurses note for additional details.  My findings/ participation are that I personally discussed the prevention plan with the patient including as documented under recommended actions.     Xitlali Kastens L Elyssa Pendelton, FNP- C     Return in about 1 year (around 11/18/2024) for Annual Medicare Wellness.

## 2023-11-19 NOTE — Patient Instructions (Signed)
 Medicare Preventive Services  Medicare coverage information Recommendation for YOU   Heart Disease and Diabetes   Lipid profile every 5 years or more often if at risk for cardiovascular disease  Lab Results   Component Value Date    CHOLESTEROL 107 10/08/2023    HDLCHOL 19 (L) 10/08/2023    LDLCHOL 34 10/08/2023    TRIG 270 (H) 10/08/2023       Diabetes Screening with Blood Glucose test or Glucose Tolerance Test Yearly for those at risk for diabetes, up to two tests per year for those with prediabetes  Last Glucose: 117     Diabetes Self-Management Training   Initial training ten hours per year, and follow-up training two hours per subsequent year. Optional for those with diabetes    Medical Nutrition Therapy   Three hours of one-on-one counseling in first year, two hours in subsequent years. Optional for those with diabetes, kidney disease   Intensive Behavioral Therapy for Obesity  Face-to-face counseling, first month every week, month 2-6 every other week, month 7-12 every month if continued progress is documented Optional for those with Body Mass Index 30 or higher  Your There is no height or weight on file to calculate BMI.   Tobacco Cessation (Quitting) Counseling   Two attempts per year, max 4 sessions per attempt, up to 8 sessions per year Optional for those who use tobacco    Cancer Screening Last Completion Date   Colorectal screening   For anyone age 34 to 25 or any age if high risk:  Screening Colonoscopy every 10 yrs if low risk,  more frequent if higher risk  OR  Cologuard Stool DNA test once every 3 years OR  Fecal Occult Blood Testing yearly OR  Flexible  Sigmoidoscopy  every 5 yr OR  CT Colonography every 5 yrs    See below for due date if applicable.   Prostate Cancer Screening  Prostate Specific Antigen blood test based on joint decision making with your provider for ages 58-69  A joint decision between you and your primary care provider   Lung Cancer Screening  Annual low dose computed tomography  (LDCT scan) is recommended for those age 38-80 who smoked 20 pack-years and are current smokers or quit smoking within past 15 years, after counseling by your doctor or nurse clinician about the possible benefits or harms. --11/07/2023  See below for due date if applicable.   Vaccinations   Respiratory syncytial virus (RSV)  Age 68 years or older: Based on shared clinical decision-making with your provider.  Pneumococcal Vaccine  Recommended routinely age 61+ with one or two separate vaccines based on your risk. Recommended before age 72 if medical conditions with increased risk  Seasonal Influenza Vaccine  Once every flu season   Hepatitis B Vaccine  3 doses if risk (including anyone with diabetes or liver disease)  Shingles Vaccine  Two doses at age 85 or older  Diphtheria Tetanus Pertussis Vaccine  ONCE as adult, booster every 10 years     Immunization History   Administered Date(s) Administered   . Covid-19 Vaccine,Pfizer-BioNTech,Purple Top,51yrs+ 07/18/2019, 08/06/2019, 04/01/2020   . FLUZONE  HD VACCINE (ADMIN) 03/26/2017   . High-Dose Influenza Vaccine, 65+ 03/19/2018   . Influenza Vaccine, 6 month-adult 03/19/2019, 03/23/2020, 04/20/2021, 03/15/2022   . Pneumovax 03/14/2013   . Tetanus Toxoid/Diphtheria Toxoid/Acellular Pertussis Vaccine, Adsorbed 03/15/2022     Shingles vaccine and Diphtheria Tetanus Pertussis vaccines are available at pharmacies or local health department without a prescription.  Other Preventative Screening  Last Completion Date   Glaucoma Screening   Yearly if in high risk group such as diabetes, family history, African American age 35+ or Hispanic American age 30+ See your Eye Care Provider   Hepatitis C Screening   Recommended  for those born between ages 18-79 years.   See below for due date if applicable.     HIV Testing  Recommended routinely at least ONCE, covered every year for age 92 to 40 regardless of risk, and every year for age over 97 who ask for the test or higher risk.  Yearly or up to 3 times in pregnancy    See below for due date if applicable.  Bone Densitometry   Screening is recommended for Men ages 40 and above with one or more risk factor: androgen deprivation therapy for prostate cancer, hypogonadism, frailty, primary hyperparathyroidism, hyperthyroidism  For men diagnosed with osteoporosis, follow up is recommended every years or a frequency recommended by your provider     See below for due date if applicable.   Abdominal Aortic Aneurysm Screening Ultrasound   Once with a family history of abdominal aortic aneurysms OR a male between65-75 and have smoked at least 100 cigarettes in your lifetime.     See below for due date if applicable.       Your Personalized Schedule for Preventive Tests   Health Maintenance: Pending and Last Completed       Date Due Completion Date    Hepatitis C screening Never done ---    Shingles Vaccine (1 of 2) Never done ---    Pneumococcal Vaccination, Age 65+ (2 of 2 - PCV) 03/14/2014 03/14/2013    RSV Adult 60+ or Pregnancy (1 - 1-dose 75+ series) Never done ---    Covid-19 Vaccine (4 - 2024-25 season) 02/11/2023 04/01/2020    Medicare Annual Wellness Visit - Calendar Year Insurers 06/13/2023 11/15/2022    CT Lung Cancer Screening 11/06/2024 11/07/2023    Depression Screening 11/18/2024 11/19/2023    Adult Tdap-Td (2 - Td or Tdap) 03/15/2032 03/15/2022           Non-Opioid Treatment for Chronic Pains   Treatment for chronic pain can be managed without opioids. Below are non-opioid options that may be considered and discussed with your provider to determine which options would be best for your health.    Over the counter or presciptions medications:  Acetaminophen (Tylenol) or Non-steroidal anti-inflammatories such as: Ibuprofen (Motrin, Advil), naproxen (Aleve), aspirin  Antidepressants such as amitriptyline, nortriptyline (Pamelor),  Doxepin (Silenor), Imipramine (Tofranil) and others.  Anticonvulsant Nerve pain medications: Gabapentin (Neurontin),  pregabalin (Lyrica)  Externally applied medications such as NSAID'S, lidocaine , capsaisin, and others  Injections: pain specialists can sometimes inject medications at the site of pain.    Alternative therapies such as  Acupuncture  Osteopathic manipulation  Chiropractic  Massage therapy       For Information on Advanced Directives for Health Care:  [place]:  [email or web address]  [place], [place], [place], [place] General Information: [email or web address]

## 2023-12-12 ENCOUNTER — Emergency Department (HOSPITAL_COMMUNITY)

## 2023-12-12 ENCOUNTER — Inpatient Hospital Stay
Admission: EM | Admit: 2023-12-12 | Discharge: 2023-12-18 | DRG: 522 | Disposition: A | Discharge status: Home / Self Care | Attending: HOSPITALIST | Admitting: HOSPITALIST

## 2023-12-12 ENCOUNTER — Encounter (HOSPITAL_COMMUNITY): Payer: Self-pay | Admitting: HOSPITALIST

## 2023-12-12 ENCOUNTER — Other Ambulatory Visit: Payer: Self-pay

## 2023-12-12 ENCOUNTER — Inpatient Hospital Stay (HOSPITAL_COMMUNITY): Admitting: HOSPITALIST

## 2023-12-12 DIAGNOSIS — W2201XA Walked into wall, initial encounter: Secondary | ICD-10-CM | POA: Diagnosis present

## 2023-12-12 DIAGNOSIS — M5116 Intervertebral disc disorders with radiculopathy, lumbar region: Secondary | ICD-10-CM | POA: Diagnosis present

## 2023-12-12 DIAGNOSIS — I10 Essential (primary) hypertension: Secondary | ICD-10-CM | POA: Diagnosis present

## 2023-12-12 DIAGNOSIS — Z951 Presence of aortocoronary bypass graft: Secondary | ICD-10-CM

## 2023-12-12 DIAGNOSIS — S72009A Fracture of unspecified part of neck of unspecified femur, initial encounter for closed fracture: Secondary | ICD-10-CM | POA: Diagnosis present

## 2023-12-12 DIAGNOSIS — G4733 Obstructive sleep apnea (adult) (pediatric): Secondary | ICD-10-CM | POA: Diagnosis present

## 2023-12-12 DIAGNOSIS — W010XXA Fall on same level from slipping, tripping and stumbling without subsequent striking against object, initial encounter: Secondary | ICD-10-CM | POA: Diagnosis present

## 2023-12-12 DIAGNOSIS — N400 Enlarged prostate without lower urinary tract symptoms: Secondary | ICD-10-CM | POA: Diagnosis present

## 2023-12-12 DIAGNOSIS — K219 Gastro-esophageal reflux disease without esophagitis: Secondary | ICD-10-CM | POA: Diagnosis present

## 2023-12-12 DIAGNOSIS — S0990XA Unspecified injury of head, initial encounter: Secondary | ICD-10-CM

## 2023-12-12 DIAGNOSIS — K589 Irritable bowel syndrome without diarrhea: Secondary | ICD-10-CM | POA: Diagnosis present

## 2023-12-12 DIAGNOSIS — Z01811 Encounter for preprocedural respiratory examination: Secondary | ICD-10-CM

## 2023-12-12 DIAGNOSIS — S72002A Fracture of unspecified part of neck of left femur, initial encounter for closed fracture: Principal | ICD-10-CM | POA: Diagnosis present

## 2023-12-12 DIAGNOSIS — M25562 Pain in left knee: Secondary | ICD-10-CM

## 2023-12-12 DIAGNOSIS — Z8781 Personal history of (healed) traumatic fracture: Secondary | ICD-10-CM | POA: Insufficient documentation

## 2023-12-12 DIAGNOSIS — F17210 Nicotine dependence, cigarettes, uncomplicated: Secondary | ICD-10-CM | POA: Diagnosis present

## 2023-12-12 DIAGNOSIS — Y9301 Activity, walking, marching and hiking: Secondary | ICD-10-CM

## 2023-12-12 DIAGNOSIS — S79912A Unspecified injury of left hip, initial encounter: Secondary | ICD-10-CM

## 2023-12-12 DIAGNOSIS — E785 Hyperlipidemia, unspecified: Secondary | ICD-10-CM | POA: Diagnosis present

## 2023-12-12 DIAGNOSIS — Y92009 Unspecified place in unspecified non-institutional (private) residence as the place of occurrence of the external cause: Secondary | ICD-10-CM

## 2023-12-12 DIAGNOSIS — I2510 Atherosclerotic heart disease of native coronary artery without angina pectoris: Secondary | ICD-10-CM | POA: Diagnosis present

## 2023-12-12 DIAGNOSIS — F32A Depression, unspecified: Secondary | ICD-10-CM | POA: Diagnosis present

## 2023-12-12 DIAGNOSIS — Z716 Tobacco abuse counseling: Secondary | ICD-10-CM

## 2023-12-12 DIAGNOSIS — Z79899 Other long term (current) drug therapy: Secondary | ICD-10-CM

## 2023-12-12 DIAGNOSIS — Z7982 Long term (current) use of aspirin: Secondary | ICD-10-CM

## 2023-12-12 DIAGNOSIS — Z7902 Long term (current) use of antithrombotics/antiplatelets: Secondary | ICD-10-CM

## 2023-12-12 DIAGNOSIS — S8992XA Unspecified injury of left lower leg, initial encounter: Secondary | ICD-10-CM

## 2023-12-12 LAB — CBC WITH DIFF
BASOPHIL #: 0 10*3/uL (ref 0.00–0.10)
BASOPHIL %: 0 % (ref 0–1)
EOSINOPHIL #: 0.1 10*3/uL (ref 0.00–0.60)
EOSINOPHIL %: 0 % — ABNORMAL LOW (ref 1–8)
HCT: 38.4 % (ref 36.7–47.1)
HGB: 13.2 g/dL (ref 12.5–16.3)
LYMPHOCYTE #: 1.5 10*3/uL (ref 1.00–3.00)
LYMPHOCYTE %: 10 % — ABNORMAL LOW (ref 15–43)
MCH: 31.3 pg (ref 23.8–33.4)
MCHC: 34.2 g/dL (ref 32.5–36.3)
MCV: 91.6 fL (ref 73.0–96.2)
MONOCYTE #: 0.8 10*3/uL (ref 0.30–1.10)
MONOCYTE %: 5 % — ABNORMAL LOW (ref 6–14)
MPV: 8.5 fL (ref 7.4–11.4)
NEUTROPHIL #: 13.8 10*3/uL — ABNORMAL HIGH (ref 1.85–7.84)
NEUTROPHIL %: 85 % — ABNORMAL HIGH (ref 44–74)
PLATELETS: 209 10*3/uL (ref 140–440)
RBC: 4.2 10*6/uL (ref 4.06–5.63)
RDW: 14.1 % (ref 12.1–16.2)
WBC: 16.2 10*3/uL — ABNORMAL HIGH (ref 3.6–10.2)

## 2023-12-12 LAB — COMPREHENSIVE METABOLIC PANEL, NON-FASTING
ALBUMIN/GLOBULIN RATIO: 1.6 — ABNORMAL HIGH (ref 0.8–1.4)
ALBUMIN: 4.1 g/dL (ref 3.5–5.7)
ALKALINE PHOSPHATASE: 82 U/L (ref 34–104)
ALT (SGPT): 19 U/L (ref 7–52)
ANION GAP: 7 mmol/L (ref 4–13)
AST (SGOT): 19 U/L (ref 13–39)
BILIRUBIN TOTAL: 0.8 mg/dL (ref 0.3–1.0)
BUN/CREA RATIO: 18 (ref 6–22)
BUN: 17 mg/dL (ref 7–25)
CALCIUM, CORRECTED: 8.5 mg/dL — ABNORMAL LOW (ref 8.9–10.8)
CALCIUM: 8.6 mg/dL (ref 8.6–10.3)
CHLORIDE: 109 mmol/L — ABNORMAL HIGH (ref 98–107)
CO2 TOTAL: 27 mmol/L (ref 21–31)
CREATININE: 0.97 mg/dL (ref 0.60–1.30)
ESTIMATED GFR: 81 mL/min/{1.73_m2} (ref >59)
GLOBULIN: 2.5 (ref 2.0–3.5)
GLUCOSE: 116 mg/dL — ABNORMAL HIGH (ref 74–109)
OSMOLALITY, CALCULATED: 288 mosm/kg (ref 270–290)
POTASSIUM: 3.9 mmol/L (ref 3.5–5.1)
PROTEIN TOTAL: 6.6 g/dL (ref 6.4–8.9)
SODIUM: 143 mmol/L (ref 136–145)

## 2023-12-12 LAB — GRAY TOP TUBE

## 2023-12-12 LAB — TYPE AND SCREEN
ABO/RH(D): A POS
ANTIBODY SCREEN: NEGATIVE

## 2023-12-12 LAB — PT/INR
INR: 0.96 (ref 0.84–1.10)
PROTHROMBIN TIME: 10.8 s (ref 9.8–12.7)

## 2023-12-12 LAB — PTT (PARTIAL THROMBOPLASTIN TIME): APTT: 29.9 s (ref 25.0–38.0)

## 2023-12-12 LAB — GOLD TOP TUBE

## 2023-12-12 MED ORDER — ATORVASTATIN 40 MG TABLET
40.0000 mg | ORAL_TABLET | Freq: Every evening | ORAL | Status: DC
Start: 2023-12-12 — End: 2023-12-18
  Administered 2023-12-12 – 2023-12-17 (×6): 40 mg via ORAL
  Filled 2023-12-12 (×6): qty 1

## 2023-12-12 MED ORDER — DIPHTH,PERTUSSIS(ACEL),TETANUS 2.5 LF UNIT-8 MCG-5 LF/0.5ML IM SYRINGE
0.5000 mL | INJECTION | INTRAMUSCULAR | Status: AC
Start: 2023-12-12 — End: 2023-12-12
  Administered 2023-12-12: 0.5 mL via INTRAMUSCULAR

## 2023-12-12 MED ORDER — MORPHINE 4 MG/ML INJECTION WRAPPER
INJECTION | INTRAMUSCULAR | Status: AC
Start: 2023-12-12 — End: 2023-12-12
  Filled 2023-12-12: qty 1

## 2023-12-12 MED ORDER — PANTOPRAZOLE 40 MG TABLET,DELAYED RELEASE
40.0000 mg | DELAYED_RELEASE_TABLET | Freq: Every evening | ORAL | Status: DC
Start: 2023-12-12 — End: 2023-12-18
  Administered 2023-12-12 – 2023-12-17 (×6): 40 mg via ORAL
  Filled 2023-12-12 (×6): qty 1

## 2023-12-12 MED ORDER — TAMSULOSIN 0.4 MG CAPSULE
0.4000 mg | ORAL_CAPSULE | Freq: Every evening | ORAL | Status: DC
Start: 2023-12-12 — End: 2023-12-12

## 2023-12-12 MED ORDER — FAMOTIDINE 40 MG TABLET
40.0000 mg | ORAL_TABLET | Freq: Two times a day (BID) | ORAL | Status: DC
Start: 2023-12-12 — End: 2023-12-18
  Administered 2023-12-12 – 2023-12-13 (×2): 40 mg via ORAL
  Administered 2023-12-13: 0 mg via ORAL
  Administered 2023-12-14 – 2023-12-18 (×9): 40 mg via ORAL
  Filled 2023-12-12 (×11): qty 1

## 2023-12-12 MED ORDER — ONDANSETRON HCL (PF) 4 MG/2 ML INJECTION SOLUTION
4.0000 mg | Freq: Four times a day (QID) | INTRAMUSCULAR | Status: DC | PRN
Start: 2023-12-12 — End: 2023-12-18

## 2023-12-12 MED ORDER — BENZTROPINE 1 MG TABLET
1.0000 mg | ORAL_TABLET | Freq: Every day | ORAL | Status: DC
Start: 2023-12-12 — End: 2023-12-12

## 2023-12-12 MED ORDER — NICOTINE 21 MG/24 HR DAILY TRANSDERMAL PATCH
21.0000 mg | MEDICATED_PATCH | TRANSDERMAL | Status: DC
Start: 2023-12-12 — End: 2023-12-16
  Administered 2023-12-12 – 2023-12-14 (×3): 21 mg via TRANSDERMAL
  Filled 2023-12-12 (×3): qty 1

## 2023-12-12 MED ORDER — METOPROLOL SUCCINATE ER 25 MG TABLET,EXTENDED RELEASE 24 HR
12.5000 mg | ORAL_TABLET | Freq: Every day | ORAL | Status: DC
Start: 2023-12-12 — End: 2023-12-18
  Administered 2023-12-12: 0 mg via ORAL
  Administered 2023-12-13 – 2023-12-18 (×6): 12.5 mg via ORAL
  Filled 2023-12-12 (×6): qty 1

## 2023-12-12 MED ORDER — TAMSULOSIN 0.4 MG CAPSULE
0.4000 mg | ORAL_CAPSULE | Freq: Every evening | ORAL | Status: DC
Start: 2023-12-12 — End: 2023-12-18
  Administered 2023-12-12 – 2023-12-17 (×6): 0.4 mg via ORAL
  Filled 2023-12-12 (×6): qty 1

## 2023-12-12 MED ORDER — ACETAMINOPHEN 325 MG TABLET
650.0000 mg | ORAL_TABLET | ORAL | Status: DC | PRN
Start: 2023-12-12 — End: 2023-12-18
  Administered 2023-12-18: 650 mg via ORAL
  Filled 2023-12-12 (×2): qty 2

## 2023-12-12 MED ORDER — LOSARTAN 50 MG TABLET
50.0000 mg | ORAL_TABLET | Freq: Two times a day (BID) | ORAL | Status: DC
Start: 2023-12-12 — End: 2023-12-18
  Administered 2023-12-12 – 2023-12-18 (×12): 50 mg via ORAL
  Filled 2023-12-12 (×12): qty 1

## 2023-12-12 MED ORDER — SERTRALINE 100 MG TABLET
100.0000 mg | ORAL_TABLET | Freq: Every evening | ORAL | Status: DC
Start: 2023-12-12 — End: 2023-12-18
  Administered 2023-12-12 – 2023-12-17 (×6): 100 mg via ORAL
  Filled 2023-12-12 (×6): qty 1

## 2023-12-12 MED ORDER — EZETIMIBE 10 MG TABLET
10.0000 mg | ORAL_TABLET | Freq: Every evening | ORAL | Status: DC
Start: 2023-12-12 — End: 2023-12-18
  Administered 2023-12-12 – 2023-12-17 (×6): 10 mg via ORAL
  Filled 2023-12-12 (×6): qty 1

## 2023-12-12 MED ORDER — ATORVASTATIN 40 MG TABLET
40.0000 mg | ORAL_TABLET | Freq: Every day | ORAL | Status: DC
Start: 2023-12-12 — End: 2023-12-12

## 2023-12-12 MED ORDER — DIPHTH,PERTUSSIS(ACEL),TETANUS 2.5 LF UNIT-8 MCG-5 LF/0.5ML IM SYRINGE
INJECTION | INTRAMUSCULAR | Status: AC
Start: 2023-12-12 — End: 2023-12-12
  Filled 2023-12-12: qty 0.5

## 2023-12-12 MED ORDER — BENZTROPINE 1 MG TABLET
1.0000 mg | ORAL_TABLET | Freq: Every evening | ORAL | Status: DC
Start: 2023-12-12 — End: 2023-12-18
  Administered 2023-12-12 – 2023-12-14 (×3): 1 mg via ORAL
  Filled 2023-12-12 (×3): qty 1

## 2023-12-12 MED ORDER — MORPHINE 4 MG/ML INJECTION WRAPPER
4.0000 mg | INJECTION | INTRAMUSCULAR | Status: AC
Start: 2023-12-12 — End: 2023-12-12
  Administered 2023-12-12: 4 mg via INTRAVENOUS

## 2023-12-12 MED ORDER — HYDROCODONE 7.5 MG-ACETAMINOPHEN 325 MG TABLET
1.0000 | ORAL_TABLET | ORAL | Status: DC | PRN
Start: 2023-12-12 — End: 2023-12-18
  Administered 2023-12-12 – 2023-12-17 (×8): 1 via ORAL
  Filled 2023-12-12 (×8): qty 1

## 2023-12-12 NOTE — ED Triage Notes (Signed)
 Fell from standing, reports left side hip and knee pain, neck pain    [facility]: fentanyl 50mcg, Zofran 4mg , 20g left ac, normal saline 500ml, monitor, BS 122, c-collar

## 2023-12-12 NOTE — ED Nurses Note (Signed)
 Report given 3 S

## 2023-12-12 NOTE — H&P (Signed)
 [facility]  Admission H&P        Date of Service:  12/12/2023  Corey [name] [id number] y.o. male  Date of Admission:  12/12/2023  Date of Birth:  1946/06/01      Chief Complaint:    Chief Complaint   Patient presents with    Fall    Hip Pain       HPI: Corey [name] is a 78 y.o., White male who presents with complaints with a fall with injury.  The patient reports that he was walking through the bedroom to go to the bathroom, he missed the turn in the doorway and hit the wall, losing his balance and falling onto his left side.  Reported immediate pain afterwards.  Presented to the emergency department where imaging was completed revealing a left femoral neck fracture.  Orthopedic surgery consulted and patient will be kept NPO after midnight for surgery in the morning.  Does report a history of CABG 3 years ago, hypertension and hyperlipidemia.  He smokes a pack of cigarettes a day.  Denies any chest pain.  Denies any associated shortness of breath.  There was no loss of consciousness with the fall.  Patient did not hit his head.    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    Narcolepsy     Neck pain 12/21/2021    Nephritis 12/21/2021    OSA (obstructive sleep apnea)     Osteoarthritis     Paresthesia 12/21/2021    Previous back surgery 12/21/2021    Right carotid bruit     Right hip pain  12/21/2021    Thigh pain 12/21/2021    Thyroid nodule     Weight loss, non-intentional 12/21/2021     Past Surgical:    Past Surgical History:   Procedure Laterality Date    CARDIAC CATHETERIZATION  07/13/2021    [id number]    Dr Garnette Ward    DENTAL SURGERY      HX BACK SURGERY      HX COLONOSCOPY  2019    had done at [facility]    HX CORONARY ARTERY BYPASS GRAFT  08/03/2021    Dr Skipper [facility]    HX HERNIA REPAIR      HX LAP CHOLECYSTECTOMY      ORTHOPEDIC SURGERY      PROSTATE SURGERY  10/2022    Dr. Emilio     Family:    Family Medical History:       Problem Relation (Age of Onset)    Brain cancer Father    Heart Disease Mother  Hypertension (High Blood Pressure) Mother    No Known Problems Sister, Brother, Maternal Grandmother, Maternal Grandfather, Paternal Grandmother, Paternal Grandfather, Daughter, Son, Maternal Aunt, Maternal Uncle, Paternal Aunt, Paternal Uncle, Half-Brother, Half-Sister, Other          Social:   reports that he has been smoking cigarettes. He started smoking about 56 years ago. He has a 56.5 pack-year smoking history. He has never used smokeless tobacco. He reports current alcohol use of about 3.0 standard drinks of alcohol per week. He reports that he does not use drugs.    Allergies[1]  Medications Prior to Admission       Prescriptions    aspirin (ECOTRIN) 81 mg Oral Tablet, Delayed Release (E.C.)    Take 1 Tablet (81 mg total) by mouth Daily    atorvastatin  (LIPITOR) 40 mg Oral Tablet    Take 1 Tablet (40 mg total) by mouth Daily for 180 days    benztropine (COGENTIN) 1 mg Oral Tablet    Take 1 Tablet (1 mg total) by mouth Daily    cyclobenzaprine  (FLEXERIL ) 10 mg Oral Tablet    Take 1 Tablet (10 mg total) by mouth Every 8 hours as needed    ezetimibe  (ZETIA ) 10 mg Oral Tablet    Take 1 Tablet (10 mg total) by mouth Every evening for 90 days Indications: High Trigylcerides.    famotidine  (PEPCID ) 40 mg Oral Tablet    Take 1 Tablet (40 mg total) by mouth Twice daily    losartan  (COZAAR ) 50 mg  Oral Tablet    Take 1 Tablet (50 mg total) by mouth Twice daily for 180 days Indications: high blood pressure    metoprolol  succinate (TOPROL -XL) 25 mg Oral Tablet Sustained Release 24 hr    Take 0.5 Tablets (12.5 mg total) by mouth Daily for 180 days Wean to 1/2 tablet, take whole tablet if heart rate is greater than 100    mirabegron (MYRBETRIQ) 25 mg Oral Tablet Sustained Release 24 hr    Take 1 Tablet (25 mg total) by mouth Daily    nitroGLYCERIN  (NITROSTAT ) 0.4 mg Sublingual Tablet, Sublingual    DISSOLVE 1 TAB UNDER TONGUE EVERY 5 MINUTES AS NEEDED FOR CHEST PAIN. DO NOT EXCEED 3 DOSES IN 15 MINUTES.    omeprazole  (PRILOSEC) 40 mg Oral Capsule, Delayed Release(E.C.)    Take 1 Capsule (40 mg total) by mouth Every morning for 180 days Indications: gastroesophageal reflux disease    sertraline  (ZOLOFT ) 100 mg Oral Tablet    Take 1 Tablet (100 mg total) by mouth Every night for 180 days Indications: repeated episodes of anxiety    tamsulosin  (FLOMAX ) 0.4 mg Oral Capsule    Take 1 Capsule (0.4 mg total) by mouth Every evening after dinner for 90 days Indications: enlarged prostate with urination problem    traMADoL  (ULTRAM ) 50 mg Oral Tablet    Take 1 Tablet (50 mg total) by mouth Every 6 hours as needed for Pain for up to 30 days Indications: pain          acetaminophen (TYLENOL) tablet, 650 mg, Oral, Q4H PRN  atorvastatin  (LIPITOR) tablet, 40 mg, Oral, Daily  benztropine (COGENTIN) tablet, 1 mg, Oral, Daily  ezetimibe  (ZETIA ) tablet, 10 mg, Oral, QPM  famotidine  (PEPCID ) tablet, 40 mg, Oral, 2x/day  losartan  (COZAAR ) tablet, 50 mg, Oral, 2x/day  metoprolol  succinate (TOPROL -XL) 24 hr extended release tablet, 12.5 mg, Oral, Daily  ondansetron (ZOFRAN) 2 mg/mL injection, 4 mg, Intravenous, Q6H PRN  pantoprazole (PROTONIX) delayed  release tablet, 40 mg, Oral, NIGHTLY  sertraline  (ZOLOFT ) tablet, 100 mg, Oral, NIGHTLY  tamsulosin  (FLOMAX ) capsule, 0.4 mg, Oral, Daily after Dinner        ROS:   Review of Systems    Constitutional: Negative for chills, diaphoresis and fever.   HENT: Negative for congestion, sinus pain, sore throat and tinnitus.    Eyes: Negative for blurred vision and double vision.   Respiratory: Negative for cough, hemoptysis, sputum production, shortness of breath and wheezing.    Cardiovascular: Negative for chest pain, palpitations, orthopnea and leg swelling.   Gastrointestinal: Negative for abdominal pain, blood in stool, diarrhea, melena, nausea and vomiting.   Genitourinary: Negative for dysuria, flank pain and hematuria.   Musculoskeletal:  Positive for left hip pain  Skin: Negative for rash.   Neurological: Negative for dizziness, focal weakness and seizures.   Endo/Heme/Allergies: Negative for environmental allergies.   Psychiatric/Behavioral: Negative for depression, substance abuse and suicidal ideas.     All other systems negative unless marked.       Exam:  Vitals:    12/12/23 1200 12/12/23 1215 12/12/23 1430 12/12/23 1600   BP: (!) 141/69 (!) 128/58 129/75 132/64   Pulse:       Resp:       Temp:       SpO2: 90% 91%     Weight:       Height:       BMI:             No intake or output data in the 24 hours ending 12/12/23 1634     Physical Exam   Constitutional: Patient is oriented to person, place, and time and well-developed, well-nourished, and in no distress.   HENT: Mucus membranes moist.  No oral ulcer.  Head: Normocephalic and atraumatic.   Mouth/Throat: Oropharynx is clear and moist.   Eyes: Pupils are equal, round, and reactive to light. Conjunctivae and EOM are normal.   Neck: Neck supple. No JVD present.   Cardiovascular: Normal rate, regular rhythm and normal heart sounds. Exam reveals no gallop and no friction rub.   No murmur heard.  Pulmonary/Chest: Breath sounds normal. No respiratory distress. No wheezes. No rales. Exhibits no tenderness.   Abdominal: Soft. Bowel sounds are normal. Patient exhibits no distension. There is no abdominal tenderness. There is no rebound and no  guarding.   Musculoskeletal:  Left hip tenderness     Neurological: Patient is alert and oriented to person, place, and time.   Skin: Skin is warm and dry. No erythema.   Psychiatric: Affect and judgment normal.     Labs:     Results for orders placed or performed during the hospital encounter of 12/12/23 (from the past 24 hours)   CBC/DIFF    Collection Time: 12/12/23 10:40 AM    Narrative    The following orders were created for panel order CBC/DIFF.  Procedure                               Abnormality         Status                     ---------                               -----------         ------  CBC WITH DIFF[[id number]]                Abnormal            Final result                 Please view results for these tests on the individual orders.   COMPREHENSIVE METABOLIC PANEL, NON-FASTING    Collection Time: 12/12/23 10:40 AM   Result Value Ref Range    SODIUM 143 136 - 145 mmol/L    POTASSIUM 3.9 3.5 - 5.1 mmol/L    CHLORIDE 109 (H) 98 - 107 mmol/L    CO2 TOTAL 27 21 - 31 mmol/L    ANION GAP 7 4 - 13 mmol/L    BUN 17 7 - 25 mg/dL    CREATININE 9.02 9.39 - 1.30 mg/dL    BUN/CREA RATIO 18 6 - 22    ESTIMATED GFR 81 >59 mL/min/1.43m^2    ALBUMIN 4.1 3.5 - 5.7 g/dL    CALCIUM 8.6 8.6 - 89.6 mg/dL    GLUCOSE 883 (H) 74 - 109 mg/dL    ALKALINE PHOSPHATASE 82 34 - 104 U/L    ALT (SGPT) 19 7 - 52 U/L    AST (SGOT) 19 13 - 39 U/L    BILIRUBIN TOTAL 0.8 0.3 - 1.0 mg/dL    PROTEIN TOTAL 6.6 6.4 - 8.9 g/dL    ALBUMIN/GLOBULIN RATIO 1.6 (H) 0.8 - 1.4    OSMOLALITY, CALCULATED 288 270 - 290 mOsm/kg    CALCIUM, CORRECTED 8.5 (L) 8.9 - 10.8 mg/dL    GLOBULIN 2.5 2.0 - 3.5    Narrative    Estimated Glomerular Filtration Rate (eGFR) is calculated using the CKD-EPI (2021) equation, intended for patients 47 years of age and older. If gender is not documented or unknown, there will be no eGFR calculation.     PT/INR    Collection Time: 12/12/23 10:40 AM   Result Value Ref Range    PROTHROMBIN TIME 10.8 9.8  - 12.7 seconds    INR 0.96 0.84 - 1.10    Narrative    In the setting of warfarin therapy, a moderate-intensity INR goal range is 2.0 to 3.0 and a high-intensity INR goal range is 2.5 to 3.5.    INR is ONLY validated to determine the level of anticoagulation with vitamin K antagonists (warfarin). Other factors may elevate the INR including but not limited to direct oral anticoagulants (DOACs), liver dysfunction, vitamin K deficiency, DIC, factor deficiencies, and factor inhibitors.   PTT (PARTIAL THROMBOPLASTIN TIME)    Collection Time: 12/12/23 10:40 AM   Result Value Ref Range    APTT 29.9 25.0 - 38.0 seconds   CBC WITH DIFF    Collection Time: 12/12/23 10:40 AM   Result Value Ref Range    WBC 16.2 (H) 3.6 - 10.2 x10^3/uL    RBC 4.20 4.06 - 5.63 x10^6/uL    HGB 13.2 12.5 - 16.3 g/dL    HCT 61.5 63.2 - 52.8 %    MCV 91.6 73.0 - 96.2 fL    MCH 31.3 23.8 - 33.4 pg    MCHC 34.2 32.5 - 36.3 g/dL    RDW 85.8 87.8 - 83.7 %    PLATELETS 209 140 - 440 x10^3/uL    MPV 8.5 7.4 - 11.4 fL    NEUTROPHIL % 85 (H) 44 - 74 %    LYMPHOCYTE % 10 (L) 15 - 43 %    MONOCYTE % 5 (  L) 6 - 14 %    EOSINOPHIL % 0 (L) 1 - 8 %    BASOPHIL % 0 0 - 1 %    NEUTROPHIL # 13.80 (H) 1.85 - 7.84 x10^3/uL    LYMPHOCYTE # 1.50 1.00 - 3.00 x10^3/uL    MONOCYTE # 0.80 0.30 - 1.10 x10^3/uL    EOSINOPHIL # 0.10 0.00 - 0.60 x10^3/uL    BASOPHIL # 0.00 0.00 - 0.10 x10^3/uL   EXTRA TUBES    Collection Time: 12/12/23 10:51 AM    Narrative    The following orders were created for panel order EXTRA TUBES.  Procedure                               Abnormality         Status                     ---------                               -----------         ------                     GOLD TOP ULAZ[[id number]]                                    Final result                 Please view results for these tests on the individual orders.   GOLD TOP TUBE    Collection Time: 12/12/23 10:51 AM   Result Value Ref Range    RAINBOW/EXTRA TUBE AUTO RESULT Yes    GRAY TOP TUBE     Collection Time: 12/12/23 10:53 AM   Result Value Ref Range    RAINBOW/EXTRA TUBE AUTO RESULT Yes    TYPE AND SCREEN    Collection Time: 12/12/23  3:04 PM   Result Value Ref Range    UNITS ORDERED NOT STATED     ABO/RH(D) A POSITIVE     ANTIBODY SCREEN NEGATIVE     SPECIMEN EXPIRATION DATE 12/15/2023,2359         Imaging Studies:    XR FEMUR LEFT   Preliminary Result      There is a subcapital left hip fracture with comparable appearance on today's CT.      No more distal left femur acute rib abnormality demonstrated      Degenerative changes in the knee. Please see report of left knee study same date.      Atherosclerotic changes noted in the femoral vessels.                      Radiologist location ID: [id number]         XR AP MOBILE CHEST   Final Result   No focal alveolar infiltrate is identified.         Radiologist location ID: [id number]         CT HIP LEFT WO IV CONTRAST   Final Result   LEFT FEMORAL NECK FRACTURE         One or more dose reduction techniques were used (e.g., Automated exposure control, adjustment of the mA and/or kV according to patient size, use  of iterative reconstruction technique).         Radiologist location ID: [id number]         CT BRAIN WO IV CONTRAST   Final Result   NO ACUTE FINDINGS         Radiologist location ID: [id number]         CT CERVICAL SPINE WO IV CONTRAST   Final Result   NO ACUTE CERVICAL FRACTURE               Radiologist location ID: [id number]         XR HIP LEFT W PELVIS 2-3 VIEWS   Final Result   DEFORMITY AT THE LEVEL OF THE LEFT FEMORAL NECK WITH A SUSPECTED FRACTURE. THIS SHOULD BE FURTHER EVALUATED WITH CT IMAGING.               Radiologist location ID: [id number]         XR KNEE LEFT 4 OR MORE VIEWS   Final Result   NO VISIBLE FRACTURE.  IF THERE IS ONGOING CLINICAL SUSPICION FOR FRACTURE, CONSIDER FOLLOWUP IMAGING IN 7-10 DAYS.                Radiologist location ID: [id number]             Code Status:  No Order    Assessment/Plan:   Active  Hospital Problems    Diagnosis    Primary Problem: Hip fracture    Closed left hip fracture    Hyperlipidemia    GERD (gastroesophageal reflux disease)     #Left femur fracture  -orthopedic surgery consultation.  The patient will be kept NPO after midnight for surgery in the morning.  -optimal pain control, morphine IV  -PT, OT consultation  -hold anticoagulation    # CAD  -history of CABG 3 years ago  -revised cardiac risk index score 6%    # hypertension  # hyperlipidemia  -resume antihypertensive medication losartan , metoprolol   -resume atorvastatin  40 mg daily    # tobacco abuse  -smoking cessation counseling  -nicotine patch p.r.n.      DVT/PE Prophylaxis:  SCDs, anticoagulation on hold for surgery  Disposition Planning:  Patient is acutely ill requiring inpatient admission or hip fracture, surgery.  Expect greater than 24 hour hospitalization    Powell Corona, FNP-BC    This note was partially generated using MModal Fluency Direct system, and there may be some incorrect words, spellings, and punctuation that were not noted in checking the note before saving.       [1]   Allergies  Allergen Reactions    Penicillins  Other Adverse Reaction (Add comment) and Rash     Childhood allergy, unknown reaction

## 2023-12-12 NOTE — ED Nurses Note (Signed)
 Pt loss balance while walking in home and fell. Pt hit top of head with minimal abrasion, skin tear to rt elbow, and pain left hip and thigh pain. Pt denies neck pain or LOC.

## 2023-12-12 NOTE — ED Provider Notes (Signed)
 Emergency Department  [facility]  12/12/2023     Corey [name]  [birth year]  78 y.o.  male  [place] [address]   [phone] (home)  PCP: Greig LITTIE Mills, FNP   Date of service:12/12/2023 16:01    Chief Complaint:   Chief Complaint   Patient presents with    Fall    Hip Pain           HPI: Patient is a 78 y.o. male who presents to the emergency department by EMS for left leg pain patient reports a fall at home from standing.  Denies LOC.  Struck his forehead area.  Landed on his left side.  He is complaining of left hip and left knee pain.  Patient reports chronic left knee pain.  Previous surgery by Dr. Myer in the 80s.  Reports blood thinners.  Review of medication shows the patient is only on aspirin.  Patient was given 50 mcg fentanyl IV and 4 mg Zofran IV and route.  Upon arrival patient is requesting additional pain medication.        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    Narcolepsy     Neck pain 12/21/2021    Nephritis 12/21/2021    OSA (obstructive sleep apnea)     Osteoarthritis     Paresthesia 12/21/2021    Previous back surgery 12/21/2021    Right carotid bruit     Right hip pain 12/21/2021    Thigh pain 12/21/2021    Thyroid nodule     Weight loss, non-intentional 12/21/2021     (Not in an outpatient encounter)     Past Surgical History:   Past Surgical History:   Procedure Laterality  Date    Cardiac catheterization  07/13/2021    Dental surgery      Hx back surgery      Hx colonoscopy  2019    Hx coronary artery bypass graft  08/03/2021    Hx hernia repair      Hx lap cholecystectomy      Orthopedic surgery      Prostate surgery  10/2022       Social History: Social History[1]     Family History:  Family History   Problem Relation Age of Onset    Hypertension (High Blood Pressure) Mother     Heart Disease Mother     Brain cancer Father     No Known Problems Sister     No Known Problems Brother     No Known Problems Maternal Grandmother     No Known Problems Maternal Grandfather  No Known Problems Paternal Grandmother     No Known Problems Paternal Grandfather     No Known Problems Daughter     No Known Problems Son     No Known Problems Maternal Aunt     No Known Problems Maternal Uncle     No Known Problems Paternal Aunt     No Known Problems Paternal Uncle     No Known Problems Half-Brother     No Known Problems Half-Sister     No Known Problems Other      Oncology History    No history exists.        Medications Prior to Admission       Prescriptions    aspirin (ECOTRIN) 81 mg Oral Tablet, Delayed Release (E.C.)    Take 1 Tablet (81 mg total) by mouth Daily    atorvastatin  (LIPITOR) 40 mg Oral Tablet    Take 1 Tablet (40 mg total) by mouth Daily for 180 days    benztropine (COGENTIN) 1 mg Oral Tablet    Take 1 Tablet (1 mg total) by mouth Daily    cyclobenzaprine  (FLEXERIL ) 10 mg Oral Tablet    Take 1 Tablet (10 mg total) by mouth Every 8 hours as needed    ezetimibe  (ZETIA ) 10 mg Oral Tablet    Take 1 Tablet (10 mg total) by mouth Every evening for 90 days Indications: High Trigylcerides.    famotidine  (PEPCID ) 40 mg Oral Tablet    Take 1 Tablet (40 mg total) by mouth Twice daily    losartan  (COZAAR ) 50 mg Oral Tablet    Take 1 Tablet (50 mg total) by mouth Twice daily for 180 days Indications: high blood pressure    metoprolol  succinate (TOPROL -XL) 25 mg Oral Tablet Sustained Release 24  hr    Take 0.5 Tablets (12.5 mg total) by mouth Daily for 180 days Wean to 1/2 tablet, take whole tablet if heart rate is greater than 100    mirabegron (MYRBETRIQ) 25 mg Oral Tablet Sustained Release 24 hr    Take 1 Tablet (25 mg total) by mouth Daily    nitroGLYCERIN  (NITROSTAT ) 0.4 mg Sublingual Tablet, Sublingual    DISSOLVE 1 TAB UNDER TONGUE EVERY 5 MINUTES AS NEEDED FOR CHEST PAIN. DO NOT EXCEED 3 DOSES IN 15 MINUTES.    omeprazole  (PRILOSEC) 40 mg Oral Capsule, Delayed Release(E.C.)    Take 1 Capsule (40 mg total) by mouth Every morning for 180 days Indications: gastroesophageal reflux disease    sertraline  (ZOLOFT ) 100 mg Oral Tablet    Take 1 Tablet (100 mg total) by mouth Every night for 180 days Indications: repeated episodes of anxiety    tamsulosin  (FLOMAX ) 0.4 mg Oral Capsule    Take 1 Capsule (0.4 mg total) by mouth Every evening after dinner for 90 days Indications: enlarged prostate with urination problem    traMADoL  (ULTRAM ) 50 mg Oral Tablet    Take 1 Tablet (50 mg total) by mouth Every 6 hours as needed for Pain for up to 30 days Indications: pain            Allergies: Allergies[2]    Above history reviewed with patient.  Allergies, medication list, reviewed.        Physical exam:  Physical Exam  Body mass index is 27.41 kg/m.  ED Triage Vitals [12/12/23 1012]   BP (Non-Invasive) (!) 156/71   Heart Rate 63   Respiratory Rate 16   Temperature 37.2 C (98.9 F)  SpO2 95 %   Weight 79.4 kg (175 lb)   Height 1.702 m (5' 7)       Constitutional: patient is oriented to person, place, and time and well-developed, well-nourished, and in no distress.   HENT:   Head: Normocephalic, contusion right forehead   Right Ear: External ear normal.   Left Ear: External ear normal.   Nose: Nose normal.   Mouth/Throat: Oropharynx is clear and moist.   Eyes: Pupils are equal, round, and reactive to light. Conjunctivae and EOM are normal.   Neck: Normal range of motion. Neck supple.  Mild cervical spine tenderness    Cardiovascular: Normal rate, regular rhythm, normal heart sounds and intact distal pulses.   Pulmonary/Chest: Effort normal and breath sounds normal.   Abdominal: Soft. Bowel sounds are normal.   Musculoskeletal:  Decreased range of motion left hip and left knee.  Tenderness to palpation left hip and left knee. No ecchymosis or erythema noted.  Knot/hardened area to left lateral thigh (pt reports chronic).  Well-healed surgical incision left knee.  Neurovascular intact  Neurological:Patient is alert and oriented to person, place, and time. GCS score is 15.   Skin: Skin is warm and dry.   Psychiatric: Mood, memory, affect and judgment normal.        The following orders were placed after examining the patient :  Orders Placed This Encounter    CT BRAIN WO IV CONTRAST    CT CERVICAL SPINE WO IV CONTRAST    CANCELED: XR KNEE LEFT 2 VIEW    XR HIP LEFT W PELVIS 2-3 VIEWS    XR KNEE LEFT 4 OR MORE VIEWS    CT HIP LEFT WO IV CONTRAST    XR AP MOBILE CHEST    XR FEMUR LEFT    CBC/DIFF    COMPREHENSIVE METABOLIC PANEL, NON-FASTING    PT/INR    PTT (PARTIAL THROMBOPLASTIN TIME)    CBC WITH DIFF    EXTRA TUBES    GOLD TOP TUBE    GRAY TOP TUBE    ECG 12 LEAD    TYPE AND SCREEN    PATIENT CLASS/LEVEL OF CARE DESIGNATION - PRN    diphtheria, pertussis-acell, tetanus (BOOSTRIX) IM injection    morphine 4 mg/mL injection      XR FEMUR LEFT   Preliminary Result      There is a subcapital left hip fracture with comparable appearance on today's CT.      No more distal left femur acute rib abnormality demonstrated      Degenerative changes in the knee. Please see report of left knee study same date.      Atherosclerotic changes noted in the femoral vessels.                      Radiologist location ID: [id number]         XR AP MOBILE CHEST   Final Result   No focal alveolar infiltrate is identified.         Radiologist location ID: [id number]         CT HIP LEFT WO IV CONTRAST   Final Result   LEFT FEMORAL NECK FRACTURE          One or more dose reduction techniques were used (e.g., Automated exposure control, adjustment of the mA and/or kV according to patient size, use of iterative reconstruction technique).         Radiologist location ID: [id number]  CT BRAIN WO IV CONTRAST   Final Result   NO ACUTE FINDINGS         Radiologist location ID: [id number]         CT CERVICAL SPINE WO IV CONTRAST   Final Result   NO ACUTE CERVICAL FRACTURE               Radiologist location ID: [id number]         XR HIP LEFT W PELVIS 2-3 VIEWS   Final Result   DEFORMITY AT THE LEVEL OF THE LEFT FEMORAL NECK WITH A SUSPECTED FRACTURE. THIS SHOULD BE FURTHER EVALUATED WITH CT IMAGING.               Radiologist location ID: [id number]         XR KNEE LEFT 4 OR MORE VIEWS   Final Result   NO VISIBLE FRACTURE.  IF THERE IS ONGOING CLINICAL SUSPICION FOR FRACTURE, CONSIDER FOLLOWUP IMAGING IN 7-10 DAYS.                Radiologist location ID: [id number]            Results for orders placed or performed during the hospital encounter of 12/12/23 (from the past 12 hours)   COMPREHENSIVE METABOLIC PANEL, NON-FASTING   Result Value Ref Range    SODIUM 143 136 - 145 mmol/L    POTASSIUM 3.9 3.5 - 5.1 mmol/L    CHLORIDE 109 (H) 98 - 107 mmol/L    CO2 TOTAL 27 21 - 31 mmol/L    ANION GAP 7 4 - 13 mmol/L    BUN 17 7 - 25 mg/dL    CREATININE 9.02 9.39 - 1.30 mg/dL    BUN/CREA RATIO 18 6 - 22    ESTIMATED GFR 81 >59 mL/min/1.63m^2    ALBUMIN 4.1 3.5 - 5.7 g/dL    CALCIUM 8.6 8.6 - 89.6 mg/dL    GLUCOSE 883 (H) 74 - 109 mg/dL    ALKALINE PHOSPHATASE 82 34 - 104 U/L    ALT (SGPT) 19 7 - 52 U/L    AST (SGOT) 19 13 - 39 U/L    BILIRUBIN TOTAL 0.8 0.3 - 1.0 mg/dL    PROTEIN TOTAL 6.6 6.4 - 8.9 g/dL    ALBUMIN/GLOBULIN RATIO 1.6 (H) 0.8 - 1.4    OSMOLALITY, CALCULATED 288 270 - 290 mOsm/kg    CALCIUM, CORRECTED 8.5 (L) 8.9 - 10.8 mg/dL    GLOBULIN 2.5 2.0 - 3.5   PT/INR   Result Value Ref Range    PROTHROMBIN TIME 10.8 9.8 - 12.7 seconds    INR 0.96 0.84 - 1.10    PTT (PARTIAL THROMBOPLASTIN TIME)   Result Value Ref Range    APTT 29.9 25.0 - 38.0 seconds   CBC WITH DIFF   Result Value Ref Range    WBC 16.2 (H) 3.6 - 10.2 x10^3/uL    RBC 4.20 4.06 - 5.63 x10^6/uL    HGB 13.2 12.5 - 16.3 g/dL    HCT 61.5 63.2 - 52.8 %    MCV 91.6 73.0 - 96.2 fL    MCH 31.3 23.8 - 33.4 pg    MCHC 34.2 32.5 - 36.3 g/dL    RDW 85.8 87.8 - 83.7 %    PLATELETS 209 140 - 440 x10^3/uL    MPV 8.5 7.4 - 11.4 fL    NEUTROPHIL % 85 (H) 44 - 74 %    LYMPHOCYTE % 10 (L) 15 - 43 %  MONOCYTE % 5 (L) 6 - 14 %    EOSINOPHIL % 0 (L) 1 - 8 %    BASOPHIL % 0 0 - 1 %    NEUTROPHIL # 13.80 (H) 1.85 - 7.84 x10^3/uL    LYMPHOCYTE # 1.50 1.00 - 3.00 x10^3/uL    MONOCYTE # 0.80 0.30 - 1.10 x10^3/uL    EOSINOPHIL # 0.10 0.00 - 0.60 x10^3/uL    BASOPHIL # 0.00 0.00 - 0.10 x10^3/uL   GOLD TOP TUBE   Result Value Ref Range    RAINBOW/EXTRA TUBE AUTO RESULT Yes    GRAY TOP TUBE   Result Value Ref Range    RAINBOW/EXTRA TUBE AUTO RESULT Yes    TYPE AND SCREEN   Result Value Ref Range    UNITS ORDERED NOT STATED     ABO/RH(D) A POSITIVE     ANTIBODY SCREEN NEGATIVE     SPECIMEN EXPIRATION DATE 12/15/2023,2359          ED Course:   EKG personally reviewed- Rate 72.   PR interval 176.  Normal sinus rhythm.  No STEMI    ED Course as of 12/12/23 1629   Wed Dec 12, 2023   1131 WBC(!): 16.2   1131 COMPREHENSIVE METABOLIC PANEL, NON-FASTING(!)  Unremarkable   1131 INR: 0.96   1132 CT CERVICAL SPINE WO IV CONTRAST  NO ACUTE CERVICAL FRACTURE   1132 CT BRAIN WO IV CONTRAST  NO ACUTE FINDINGS   1132 XR HIP LEFT W PELVIS 2-3 VIEWS  DEFORMITY AT THE LEVEL OF THE LEFT FEMORAL NECK WITH A SUSPECTED FRACTURE. THIS SHOULD BE FURTHER EVALUATED WITH CT IMAGING.   1133 XR KNEE LEFT 4 OR MORE VIEWS  NO VISIBLE FRACTURE.  IF THERE IS ONGOING CLINICAL SUSPICION FOR FRACTURE, CONSIDER FOLLOWUP IMAGING IN 7-10 DAYS.    1405 CT HIP LEFT WO IV CONTRAST  LEFT FEMORAL NECK FRACTURE   1500 Dr. Stevie we will see patient on consult.  Requested  patient be admitted to the hospitalist service and NPO after midnight   1543 Dr. Stevie requested to hold on admission and get a femur x-ray   1601 XR FEMUR LEFT  There is a subcapital left hip fracture with comparable appearance on today's CT.     No more distal left femur acute rib abnormality demonstrated     Degenerative changes in the knee. Please see report of left knee study same date.     Atherosclerotic changes noted in the femoral vessels.     1601 Requested admission from hospitalist.  Femur x-ray does not show any new acute findings      Medications Ordered/Administered in the ED   diphtheria, pertussis-acell, tetanus (BOOSTRIX) IM injection (0.5 mL IntraMUSCULAR Given 12/12/23 1040)   morphine 4 mg/mL injection (4 mg Intravenous Given 12/12/23 1131)               Emergency Department Procedure:      Procedures    Medical Decision Making  Patient is a 78 y.o. male who presents to the emergency department by EMS for left leg pain patient reports a fall at home from standing.  Denies LOC.  Struck his forehead area.  Landed on his left side.  He is complaining of left hip and left knee pain.  Patient reports chronic left knee pain.  Previous surgery by Dr. Myer in the 80s.  Reports blood thinners.  Review of medication shows the patient is only on aspirin.  Patient was given 50  mcg fentanyl IV and 4 mg Zofran IV and route.  Upon arrival patient is requesting additional pain medication.  Patient was medicated with 4 mg of morphine IV with improvement in pain.  Labs unremarkable.  Left hip x-ray shows questionable fracture.  CT shows left femoral neck fracture.  X-ray of the left knee shows no acute findings.  CT brain and cervical spine show no acute finding.  Patient discussed with Dr. Arvin and initially requested patient be admitted to the hospitalist service.  Ortho called back and saw an irregularity of the left femur and requests left femur x-ray.  Femur x-ray does not show any acute findings.   Patient evaluated in the ER by Dr Lendia and accepted to his service. Patient is to be NPO after midnight.    Amount and/or Complexity of Data Reviewed  Labs: ordered.  Radiology: ordered. Decision-making details documented in ED Course.  ECG/medicine tests: independent interpretation performed.    Risk  Prescription drug management.  Parenteral controlled substances.  Decision regarding hospitalization.            Findings and diagnosis discussed with patient.  Clinical Impression:   Clinical Impression   Closed fracture of left hip, initial encounter (CMS HCC) (Primary)   Acute pain of left knee - Acute on chronic   Head injury due to trauma         Disposition: Admitted          No follow-ups on file.   New Prescriptions    No medications on file      No follow-up provider specified.    Future Appointments  Future Appointments   Date Time Provider Department Center   01/02/2024  2:30 PM Harrup, Amy L, FNP [facility]        BP 132/64   Pulse 63   Temp 37.2 C (98.9 F)   Resp 16   Ht 1.702 m (5' 7)   Wt 79.4 kg (175 lb)   SpO2 91%   BMI 27.41 kg/m        Lauraine LITTIE Crock, PA-C  12/12/2023              This note was partially created using voice recognition software and is inherently subject to errors including those of syntax and sound alike  substitutions which may escape proof reading.  In such instances, original meaning may be extrapolated by contextual derivation.         [1]   Social History  Tobacco Use    Smoking status: Every Day     Current packs/day: 1.00     Average packs/day: 1 pack/day for 56.5 years (56.5 ttl pk-yrs)     Types: Cigarettes     Start date: 1969    Smokeless tobacco: Never    Tobacco comments:     Down to half a pack a day   Vaping Use    Vaping status: Never Used   Substance Use Topics    Alcohol use: Yes     Alcohol/week: 3.0 standard drinks of alcohol     Types: 3 Cans of beer per week     Comment: occasionally    Drug use: Never   [2]   Allergies  Allergen Reactions     Penicillins  Other Adverse Reaction (Add comment) and Rash     Childhood allergy, unknown reaction

## 2023-12-12 NOTE — Progress Notes (Signed)
 ORTHOPAEDIC SURGERY PROGRESS NOTE    Patient Name: Corey [name]   MRN: [id number]   Date of Birth: 08-10-1945  Date: 12/12/2023  Age: 78 y.o.    Gender: male  Attending Physician: No att. providers found    Contacted concerning left hip fracture. Imaging reviewed. Evidence of displcaed left femoral neck fracture. Plan for operative stabilization tomorrow. NPO after midnight. HOLD pharmacologic anticoagulation. Will see and evaluate patient in AM and discuss treatment plan.       Karleen Spence, D.O.  12/12/23 15:55  [facility]  Please call with any questions/concerns     This note has been created with voice recognition software.  Please excuse any errors in transcription.  Occasional wrong word or sound alike substitutions may have occurred due to the inherent limitations of voice recognition software.  Please read the chart carefully and recognize using context with the substitutions may have occurred.

## 2023-12-12 NOTE — Nurses Notes (Signed)
 Patients O2 sats 85-90% on roomair. 2L NC applied. O2 sats now >90%.

## 2023-12-13 ENCOUNTER — Inpatient Hospital Stay (HOSPITAL_COMMUNITY)

## 2023-12-13 ENCOUNTER — Inpatient Hospital Stay (HOSPITAL_COMMUNITY): Admitting: Certified Registered"

## 2023-12-13 ENCOUNTER — Inpatient Hospital Stay (HOSPITAL_COMMUNITY): Admit: 2023-12-13 | Payer: Self-pay | Admitting: Orthopaedic Surgery

## 2023-12-13 ENCOUNTER — Encounter (HOSPITAL_COMMUNITY)
Admission: EM | Disposition: A | Discharge status: Home / Self Care | Payer: Self-pay | Source: Home / Self Care | Attending: HOSPITALIST

## 2023-12-13 DIAGNOSIS — E785 Hyperlipidemia, unspecified: Secondary | ICD-10-CM

## 2023-12-13 DIAGNOSIS — Z471 Aftercare following joint replacement surgery: Secondary | ICD-10-CM

## 2023-12-13 DIAGNOSIS — Z96642 Presence of left artificial hip joint: Secondary | ICD-10-CM

## 2023-12-13 DIAGNOSIS — F17210 Nicotine dependence, cigarettes, uncomplicated: Secondary | ICD-10-CM

## 2023-12-13 DIAGNOSIS — I2510 Atherosclerotic heart disease of native coronary artery without angina pectoris: Secondary | ICD-10-CM

## 2023-12-13 DIAGNOSIS — I10 Essential (primary) hypertension: Secondary | ICD-10-CM

## 2023-12-13 DIAGNOSIS — R9431 Abnormal electrocardiogram [ECG] [EKG]: Secondary | ICD-10-CM

## 2023-12-13 LAB — CBC WITH DIFF
BASOPHIL #: 0 10*3/uL (ref 0.00–0.10)
BASOPHIL %: 0 % (ref 0–1)
EOSINOPHIL #: 0.2 10*3/uL (ref 0.00–0.60)
EOSINOPHIL %: 2 % (ref 1–8)
HCT: 37.4 % (ref 36.7–47.1)
HGB: 13.1 g/dL (ref 12.5–16.3)
LYMPHOCYTE #: 1.7 10*3/uL (ref 1.00–3.00)
LYMPHOCYTE %: 15 % (ref 15–43)
MCH: 32.3 pg (ref 23.8–33.4)
MCHC: 35.2 g/dL (ref 32.5–36.3)
MCV: 91.7 fL (ref 73.0–96.2)
MONOCYTE #: 0.6 10*3/uL (ref 0.30–1.10)
MONOCYTE %: 5 % — ABNORMAL LOW (ref 6–14)
MPV: 8.5 fL (ref 7.4–11.4)
NEUTROPHIL #: 8.4 10*3/uL — ABNORMAL HIGH (ref 1.85–7.84)
NEUTROPHIL %: 77 % — ABNORMAL HIGH (ref 44–74)
PLATELETS: 176 10*3/uL (ref 140–440)
RBC: 4.07 10*6/uL (ref 4.06–5.63)
RDW: 13.7 % (ref 12.1–16.2)
WBC: 10.9 10*3/uL — ABNORMAL HIGH (ref 3.6–10.2)

## 2023-12-13 LAB — PARACHUTE DME: Item Description: 3

## 2023-12-13 LAB — ECG 12 LEAD
Atrial Rate: 72 {beats}/min
Calculated P Axis: 76 degrees
Calculated R Axis: 74 degrees
Calculated T Axis: 77 degrees
PR Interval: 176 ms
QRS Duration: 76 ms
QT Interval: 394 ms
QTC Calculation: 431 ms
Ventricular rate: 72 {beats}/min

## 2023-12-13 LAB — BASIC METABOLIC PANEL
ANION GAP: 7 mmol/L (ref 4–13)
BUN/CREA RATIO: 19 (ref 6–22)
BUN: 16 mg/dL (ref 7–25)
CALCIUM: 8.6 mg/dL (ref 8.6–10.3)
CHLORIDE: 108 mmol/L — ABNORMAL HIGH (ref 98–107)
CO2 TOTAL: 23 mmol/L (ref 21–31)
CREATININE: 0.86 mg/dL (ref 0.60–1.30)
ESTIMATED GFR: 90 mL/min/{1.73_m2} (ref >59)
GLUCOSE: 109 mg/dL (ref 74–109)
OSMOLALITY, CALCULATED: 278 mosm/kg (ref 270–290)
POTASSIUM: 3.8 mmol/L (ref 3.5–5.1)
SODIUM: 138 mmol/L (ref 136–145)

## 2023-12-13 LAB — MAGNESIUM: MAGNESIUM: 1.8 mg/dL — ABNORMAL LOW (ref 1.9–2.7)

## 2023-12-13 SURGERY — ARTHROPLASTY HIP HEMI/BIPOLAR
Anesthesia: General | Site: Hip | Laterality: Left | Wound class: Clean Wound: Uninfected operative wounds in which no inflammation occurred

## 2023-12-13 MED ORDER — FENTANYL (PF) 50 MCG/ML INJECTION WRAPPER
50.0000 ug | INJECTION | INTRAMUSCULAR | Status: DC | PRN
Start: 2023-12-13 — End: 2023-12-13

## 2023-12-13 MED ORDER — FAMOTIDINE (PF) 20 MG/2 ML INTRAVENOUS SOLUTION
INTRAVENOUS | Status: AC
Start: 2023-12-13 — End: 2023-12-13
  Filled 2023-12-13: qty 2

## 2023-12-13 MED ORDER — CEFAZOLIN 2 GRAM INTRAVENOUS SOLUTION
INTRAVENOUS | Status: AC
Start: 2023-12-13 — End: 2023-12-13
  Filled 2023-12-13: qty 14.71

## 2023-12-13 MED ORDER — SODIUM CHLORIDE 0.9 % INTRAVENOUS SOLUTION
INTRAVENOUS | Status: AC
Start: 2023-12-13 — End: 2023-12-13
  Filled 2023-12-13: qty 100

## 2023-12-13 MED ORDER — ETHYL ALCOHOL 62 % TOPICAL SWAB
1.0000 | Freq: Once | CUTANEOUS | Status: AC
Start: 2023-12-13 — End: 2023-12-13
  Administered 2023-12-13: 1 via NASAL

## 2023-12-13 MED ORDER — SENNOSIDES 8.6 MG-DOCUSATE SODIUM 50 MG TABLET
1.0000 | ORAL_TABLET | Freq: Two times a day (BID) | ORAL | Status: DC | PRN
Start: 2023-12-13 — End: 2023-12-18
  Administered 2023-12-14: 1 via ORAL
  Filled 2023-12-13 (×2): qty 1

## 2023-12-13 MED ORDER — PROCHLORPERAZINE EDISYLATE 10 MG/2 ML (5 MG/ML) INJECTION SOLUTION
5.0000 mg | Freq: Once | INTRAMUSCULAR | Status: DC | PRN
Start: 2023-12-13 — End: 2023-12-13

## 2023-12-13 MED ORDER — LIDOCAINE (PF) 100 MG/5 ML (2 %) INTRAVENOUS SYRINGE
INJECTION | Freq: Once | INTRAVENOUS | Status: DC | PRN
Start: 2023-12-13 — End: 2023-12-13
  Administered 2023-12-13: 60 mg via INTRAVENOUS

## 2023-12-13 MED ORDER — VASOPRESSIN 20 UNIT/ML INTRAVENOUS SOLUTION
INTRAVENOUS | Status: AC
Start: 2023-12-13 — End: 2023-12-13
  Filled 2023-12-13: qty 1

## 2023-12-13 MED ORDER — TRANEXAMIC ACID 1,000 MG/10 ML (100 MG/ML) INTRAVENOUS SOLUTION
INTRAVENOUS | Status: AC
Start: 2023-12-13 — End: 2023-12-13
  Filled 2023-12-13: qty 10

## 2023-12-13 MED ORDER — HYDROMORPHONE 2 MG/ML INJECTION WRAPPER
INJECTION | INTRAMUSCULAR | Status: AC
Start: 2023-12-13 — End: 2023-12-13
  Filled 2023-12-13: qty 1

## 2023-12-13 MED ORDER — IPRATROPIUM 0.5 MG-ALBUTEROL 3 MG (2.5 MG BASE)/3 ML NEBULIZATION SOLN
3.0000 mL | INHALATION_SOLUTION | Freq: Once | RESPIRATORY_TRACT | Status: DC | PRN
Start: 2023-12-13 — End: 2023-12-13

## 2023-12-13 MED ORDER — FENTANYL (PF) 50 MCG/ML INJECTION WRAPPER
INJECTION | Freq: Once | INTRAMUSCULAR | Status: DC | PRN
Start: 2023-12-13 — End: 2023-12-13
  Administered 2023-12-13 (×2): 50 ug via INTRAVENOUS

## 2023-12-13 MED ORDER — LACTATED RINGERS INTRAVENOUS SOLUTION
INTRAVENOUS | Status: DC
Start: 2023-12-13 — End: 2023-12-17

## 2023-12-13 MED ORDER — LACTATED RINGERS INTRAVENOUS SOLUTION
INTRAVENOUS | Status: DC | PRN
Start: 2023-12-13 — End: 2023-12-13

## 2023-12-13 MED ORDER — ACETAMINOPHEN 1,000 MG/100 ML (10 MG/ML) INTRAVENOUS SOLUTION
Freq: Once | INTRAVENOUS | Status: DC | PRN
Start: 2023-12-13 — End: 2023-12-13
  Administered 2023-12-13: 1000 mg via INTRAVENOUS

## 2023-12-13 MED ORDER — ENOXAPARIN 40 MG/0.4 ML SUBCUTANEOUS SYRINGE
40.0000 mg | INJECTION | SUBCUTANEOUS | Status: DC
Start: 2023-12-14 — End: 2023-12-17
  Administered 2023-12-14 – 2023-12-16 (×3): 40 mg via SUBCUTANEOUS
  Filled 2023-12-13 (×4): qty 0.4

## 2023-12-13 MED ORDER — DEXAMETHASONE SODIUM PHOSPHATE 4 MG/ML INJECTION SOLUTION
Freq: Once | INTRAMUSCULAR | Status: DC | PRN
Start: 2023-12-13 — End: 2023-12-13
  Administered 2023-12-13: 4 mg via INTRAVENOUS

## 2023-12-13 MED ORDER — ALBUTEROL SULFATE 2.5 MG/3 ML (0.083 %) SOLUTION FOR NEBULIZATION
2.5000 mg | INHALATION_SOLUTION | Freq: Once | RESPIRATORY_TRACT | Status: DC | PRN
Start: 2023-12-13 — End: 2023-12-13

## 2023-12-13 MED ORDER — BISACODYL 10 MG RECTAL SUPPOSITORY
10.0000 mg | Freq: Every day | RECTAL | Status: DC | PRN
Start: 2023-12-13 — End: 2023-12-18
  Filled 2023-12-13: qty 1

## 2023-12-13 MED ORDER — FAMOTIDINE (PF) 20 MG/2 ML INTRAVENOUS SOLUTION
Freq: Once | INTRAVENOUS | Status: DC | PRN
Start: 2023-12-13 — End: 2023-12-13
  Administered 2023-12-13: 20 mg via INTRAVENOUS

## 2023-12-13 MED ORDER — ONDANSETRON HCL (PF) 4 MG/2 ML INJECTION SOLUTION
Freq: Once | INTRAMUSCULAR | Status: DC | PRN
Start: 2023-12-13 — End: 2023-12-13
  Administered 2023-12-13: 4 mg via INTRAVENOUS

## 2023-12-13 MED ORDER — MAGNESIUM SULFATE 1 GRAM/100 ML IN DEXTROSE 5 % INTRAVENOUS PIGGYBACK
1.0000 g | INJECTION | Freq: Once | INTRAVENOUS | Status: AC
Start: 2023-12-13 — End: 2023-12-13
  Administered 2023-12-13: 0 g via INTRAVENOUS
  Administered 2023-12-13: 1 g via INTRAVENOUS
  Filled 2023-12-13: qty 100

## 2023-12-13 MED ORDER — SUGAMMADEX 100 MG/ML INTRAVENOUS SOLUTION
Freq: Once | INTRAVENOUS | Status: DC | PRN
Start: 2023-12-13 — End: 2023-12-13
  Administered 2023-12-13: 300 mg via INTRAVENOUS

## 2023-12-13 MED ORDER — SODIUM CHLORIDE 0.9 % INTRAVENOUS SOLUTION
2.0000 g | Freq: Once | INTRAVENOUS | Status: AC
Start: 2023-12-13 — End: 2023-12-13
  Administered 2023-12-13: 2 g via INTRAVENOUS

## 2023-12-13 MED ORDER — ONDANSETRON HCL (PF) 4 MG/2 ML INJECTION SOLUTION
4.0000 mg | Freq: Once | INTRAMUSCULAR | Status: DC | PRN
Start: 2023-12-13 — End: 2023-12-13

## 2023-12-13 MED ORDER — ACETAMINOPHEN 1,000 MG/100 ML (10 MG/ML) INTRAVENOUS SOLUTION
INTRAVENOUS | Status: AC
Start: 2023-12-13 — End: 2023-12-13
  Filled 2023-12-13: qty 100

## 2023-12-13 MED ORDER — FENTANYL (PF) 50 MCG/ML INJECTION SOLUTION
INTRAMUSCULAR | Status: AC
Start: 2023-12-13 — End: 2023-12-13
  Filled 2023-12-13: qty 2

## 2023-12-13 MED ORDER — SODIUM CHLORIDE 0.9 % INTRAVENOUS SOLUTION
2.0000 g | Freq: Three times a day (TID) | INTRAVENOUS | Status: AC
Start: 2023-12-14 — End: 2023-12-14
  Administered 2023-12-14: 0 g via INTRAVENOUS
  Administered 2023-12-14: 2 g via INTRAVENOUS
  Administered 2023-12-14: 0 g via INTRAVENOUS
  Administered 2023-12-14: 2 g via INTRAVENOUS
  Filled 2023-12-13 (×2): qty 14.71

## 2023-12-13 MED ORDER — PROPOFOL 10 MG/ML IV BOLUS
INJECTION | Freq: Once | INTRAVENOUS | Status: DC | PRN
Start: 2023-12-13 — End: 2023-12-13
  Administered 2023-12-13: 150 mg via INTRAVENOUS

## 2023-12-13 MED ORDER — HYDROMORPHONE 2 MG/ML INJECTION WRAPPER
INJECTION | Freq: Once | INTRAMUSCULAR | Status: DC | PRN
Start: 2023-12-13 — End: 2023-12-13
  Administered 2023-12-13: 1 mg via INTRAVENOUS
  Administered 2023-12-13: .6 mg via INTRAVENOUS

## 2023-12-13 MED ORDER — PHENYLEPHRINE 10 MG/ML INJECTION SOLUTION
Freq: Once | INTRAMUSCULAR | Status: DC | PRN
Start: 2023-12-13 — End: 2023-12-13
  Administered 2023-12-13: 100 ug via INTRAVENOUS

## 2023-12-13 MED ORDER — TRANEXAMIC ACID 1,000 MG/10 ML (100 MG/ML) INTRAVENOUS SOLUTION
1000.0000 mg | Freq: Once | INTRAVENOUS | Status: AC
Start: 2023-12-13 — End: 2023-12-13
  Administered 2023-12-13: 1000 mg via INTRAVENOUS
  Filled 2023-12-13: qty 10

## 2023-12-13 MED ORDER — FENTANYL (PF) 50 MCG/ML INJECTION WRAPPER
25.0000 ug | INJECTION | INTRAMUSCULAR | Status: DC | PRN
Start: 2023-12-13 — End: 2023-12-13

## 2023-12-13 MED ORDER — ROCURONIUM 10 MG/ML INTRAVENOUS SOLUTION
Freq: Once | INTRAVENOUS | Status: DC | PRN
Start: 2023-12-13 — End: 2023-12-13
  Administered 2023-12-13: 50 mg via INTRAVENOUS
  Administered 2023-12-13: 20 mg via INTRAVENOUS

## 2023-12-13 MED ORDER — HYDROCODONE 7.5 MG-ACETAMINOPHEN 325 MG TABLET
1.0000 | ORAL_TABLET | Freq: Four times a day (QID) | ORAL | 0 refills | Status: DC | PRN
Start: 2023-12-13 — End: 2023-12-27

## 2023-12-13 MED ORDER — ETHYL ALCOHOL 62 % TOPICAL SWAB
1.0000 | Freq: Two times a day (BID) | CUTANEOUS | Status: DC
Start: 2023-12-13 — End: 2023-12-18
  Administered 2023-12-13 – 2023-12-18 (×10): 1 via NASAL

## 2023-12-13 SURGICAL SUPPLY — 38 items
APPL 70% ISPRP 2% CHG 26ML CHLRPRP HI-LT ORNG PREP STRL LF  DISP CLR (MED SURG SUPPLIES) ×1 IMPLANT
BANDAGE COFLX 5YDX6IN STRL CHSV SLF ADH FOAM COMPRESS TAN LF (WOUND CARE SUPPLY) ×1 IMPLANT
BLADE 10 2 END CBNSTL SURG STRL DISP (SURGICAL CUTTING SUPPLIES) ×3 IMPLANT
BLADE SAW 3.543X.828IN SGTL THK.05IN STRYK PRFRM SER STERNUM SYS 6 (SURGICAL CUTTING SUPPLIES) ×1 IMPLANT
CLEANER INSTRUMENT PRE-KLENZ 13.5 OZ (MISCELLANEOUS PT CARE ITEMS) ×1 IMPLANT
COVER EQP 90X60IN HVDTY BCK PAD FNFLD BLU STRL (DRAPE/PACKS/SHEETS/OR TOWEL) ×3 IMPLANT
DEVICE SUCT 2 FILTER CHAMBER SCKR STRL LF  DISP (MED SURG SUPPLIES) ×1 IMPLANT
DRAPE 2 INCS FILM ANTIMIC 33X23IN IOBN STRL SURG (DRAPE/PACKS/SHEETS/OR TOWEL) ×1 IMPLANT
DRAPE 76X55IN 3 QT HALYARD LF  STRL DISP SURG (DRAPE/PACKS/SHEETS/OR TOWEL) ×2 IMPLANT
DRAPE SURG TRDRP FEN HKLP TUBE HLDR 136X116IN HIP 8.25IN 6.75IN STRL CNTL + SMS 88IN (DRAPE/PACKS/SHEETS/OR TOWEL) ×1 IMPLANT
DRAPE U LF  STRL DISP SURG (DRAPE/PACKS/SHEETS/OR TOWEL) ×2 IMPLANT
DRESS 10X4IN POSTOP STRL MPLX BR FOAM DISP (WOUND CARE SUPPLY) ×1 IMPLANT
ELECTRODE ESURG BLADE 6.5IN MEGADYNE EZ CLEAN STRL PTFE DISP (SURGICAL CUTTING SUPPLIES) ×1 IMPLANT
GLOVE SURG 6.5 LF  PF BEAD CUF STRL CRM 11.3IN PROTEXIS PI PLISPRN THK9.1 MIL (GLOVES AND ACCESSORIES) ×2 IMPLANT
GLOVE SURG 6.5 LF  PF SMOOTH BEAD CUF INTLK STRL BLU 11.3IN PROTEXIS NEU-THERA PLISPRN THK7.9 MIL (GLOVES AND ACCESSORIES) ×2 IMPLANT
GLOVE SURG 7 LF  PF SMOOTH BEAD CUF INTLK STRL BLU 11.8IN PROTEXIS NEU-THERA PLISPRN THK7.9 MIL (GLOVES AND ACCESSORIES) ×1 IMPLANT
GLOVE SURG 7.5 LF  PF BEAD CUF STRL CRM 11.8IN PROTEXIS PI PLISPRN THK9.1 MIL (GLOVES AND ACCESSORIES) ×1 IMPLANT
GLOVE SURG 7.5 LTX PF SMOOTH BEAD CUF 3 PLY STRL LIGHT BRN 11.6IN PROTEXIS NATURAL RUB NTR THK9.8 (GLOVES AND ACCESSORIES) ×1 IMPLANT
GLOVE SURG 8 LF  PF SMOOTH BEAD CUF INTLK STRL BLU 11.8IN PROTEXIS NEU-THERA PLISPRN THK7.9 MIL (GLOVES AND ACCESSORIES) ×1 IMPLANT
GOWN SURG XL XLNG L4 REINF HKLP CLSR SET IN SLEEVE STRL LF  DISP BLU SIRUS SMS PE 56IN (DRAPE/PACKS/SHEETS/OR TOWEL) ×1 IMPLANT
HEAD UHR COCR UHMWPE HIP FEM UNIV BIPOLAR 47MM 28MM (IMPLANTS HIP) ×1 IMPLANT
HEAD V40 LFIT 28MM +0MM OFST TAPER HIP COCR FEM PRIM STRL (IMPLANTS HIP) ×1 IMPLANT
MATTRESS TRANSF 78X34IN AIRPAL C1000LB LONG STD 2 BELT 2 HOSE ATTACHMENT PNT SNAP BUTTON LBL DISP (MED SURG SUPPLIES) ×1 IMPLANT
PENCIL SMOKEVAC COAT PSHBTN LF (MED SURG SUPPLIES) ×1 IMPLANT
PILLOW THK6IN UNIV NRW CNCV ABDUCT HIP HI DNS BRTHBL HOOK CLSR LINER FOAM 21.5X14.5X5IN NONST LF (ORTHOPEDICS (NOT IMPLANTS)) ×1 IMPLANT
SET INTPLS SUCT TUBE FAN SPRAY TIP HANDPC STRL LF  DISP (MED SURG SUPPLIES) ×1 IMPLANT
SOL IRRG 0.9% NACL 2000ML PRSV FR N-PYRG FLXB CONTAINR STRL LF (MEDICATIONS/SOLUTIONS) ×1 IMPLANT
SPONGE LAP 18X18IN PREWASH RIGID TRY STRL LF  WHT (MED SURG SUPPLIES) ×2 IMPLANT
STAPLER SKIN 4.1X6.5MM 35 W STPL CART LF  APS U DISP CLR SS PLASTIC (WOUND CARE SUPPLY) ×1 IMPLANT
STEM FEM ACCOLADE V40 111MM PRFT PUREFIX TI HIP 132D 6 41MM STD OFST STRL (IMPLANTS HIP) ×1 IMPLANT
SUTURE 0 CT VICRYL+ 36IN VIOL BRD ANBCTRL COAT ABS (SUTURE/WOUND CLOSURE) ×2 IMPLANT
SUTURE 1 CT STRATAFIX PDS + 18IN VIOL ABS KNOTLESS TISS CONTROL SMTR ABS (SUTURE/WOUND CLOSURE) ×1 IMPLANT
SUTURE 2-0 CT1 TAPERPOINT VICRYL+ 27IN UNDYED BRD ANBCTRL COAT ABS (SUTURE/WOUND CLOSURE) ×3 IMPLANT
SUTURE 5 CCS-1 FIBERWIRE 38IN BLU BRD TIE MS LWR KNT PROF NONAB ×1 IMPLANT
TOWEL 24X16IN COTTON BLU DISP SURG STRL LF (DRAPE/PACKS/SHEETS/OR TOWEL) ×2 IMPLANT
TRAY SURG BSIN 50X50IN SRST BSIN TBL SFT LIGHT HNDL CVR TWL STRL LF (CUSTOM TRAYS & PACK) ×1 IMPLANT
TRAY ~~LOC~~ ARTHROSCOPY ~~LOC~~ - ~~LOC~~ COMMUNITY HOSP (CUSTOM TRAYS & PACK) ×1 IMPLANT
WOUND IRRG IRRISEPT DBRD CLNSG 0.05% CHG SYSTEM STRL LF (WOUND CARE SUPPLY) ×1 IMPLANT

## 2023-12-13 NOTE — PT Evaluation (Signed)
..  [facility] Medicine [facility]  [address]  [place], [address]  [phone]  (Fax) [phone]  Rehabilitation Services  Physical Therapy     Patient Name: Corey [name]  Date of Birth: July 17, 1945  Height: Height: 170.2 cm (5' 7)  Weight: Weight: 70.4 kg (155 lb 4.8 oz)  Room/Bed: 372/A  Payor: [employer] ADVANTAGE / Plan: [employer]VANTAGE PPO / Product Type: PPO /     PATIENT DID NOT PARTICIPATE IN THERAPY TODAY DUE TO: Pt is scheduled to have surgery this PM. Will endorse for PT eval in the AM.        Corey [name], PT 12/13/2023,09:31

## 2023-12-13 NOTE — OR Nursing (Signed)
 PATIENT TO PACU FROM FLOOR AT 1725 BYPASSING PRE OP

## 2023-12-13 NOTE — Care Management Notes (Signed)
 [facility]  Care Management Initial Evaluation    Patient Name: Corey [name]  Date of Birth: 1945-09-29  Sex: male  Date/Time of Admission: 12/12/2023 10:13 AM  Room/Bed: 372/A  Payor: [employer] MEDICARE ADVANTAGE / Plan: [employer] MEDICARE ADVANTAGE PPO / Product Type: PPO /   Primary Care Providers:  Jacolyn Greig CROME, FNP, FNP (General)    Pharmacy Info:   Preferred Pharmacy       [facility] [address] - [place], [place] - [address]    [address] [place] [address]    Phone: [phone] Fax: [phone]    Hours: Not open 24 hours    [facility] [place], [place] [address]    [address]  [place] [address]    Phone: [phone] Fax: [phone]    Hours: Not open 24 hours          Emergency Contact Info:   Extended Emergency Contact Information  Primary Emergency Contact: Spearman,HELEN  Address: [address] [place], [place] [address] [place]  Home Phone: [phone]  Work Phone: [phone]  Mobile Phone: [phone]  Relation: Wife  Interpreter needed? No  Secondary Emergency Contact: Mattis Featherly  Mobile Phone: [phone]  Relation: Daughter    History:   Berl Bonfanti is a 78 y.o., male, admitted 12/12/23    Height/Weight: 170.2 cm (5' 7) / 70.4 kg (155 lb 4.8 oz)     LOS: 1 day   Admitting Diagnosis: Hip fracture [S72.009A]    Assessment:    12/13/23 1431   Assessment Details   Assessment Type Admission   Date of Care Management Update 12/13/23   Readmission   Is this a readmission? No   Insurance Information/Type   Insurance type Medicare   Employment/Financial   Patient has Prescription Coverage?  Yes        Name of Insurance Coverage for Medications [employer] Advantage   Financial/Environmental Concerns none   Living Environment   Lives With spouse   Living Arrangements house   Able to Return to Prior Arrangements yes   Living Arrangement Comments Pt lives with his wife in a one story house.   Home  Safety   Home Assessment: No Problems Identified   Home Accessibility no concerns   Custody and Legal Status   Do you have a court appointed guardian/conservator? No   Are you an emancipated minor? No   Custody Issues? No   Paternity Affidavit Requested? No   Care Management Plan   Discharge Planning Status initial meeting   Projected Discharge Date 12/17/23   Discharge plan discussed with: Patient   CM will evaluate for rehabilitation potential yes   Patient choice offered to patient/family Offered, but patient/family declined   Discharge Needs Assessment   Outpatient/Agency/Support Group Needs inpatient rehabilitation facility   Equipment Currently Used at Home none   Equipment Needed After Discharge commode;walker, rolling   Discharge Facility/Level of Care Needs Acute Rehab Placement/Return (not psych)(code 62);Home with Home Health and DME (code 6)   Transportation Available car;family or friend will provide   Referral Information   Admission Type inpatient   Address Verified verified-no changes   Arrived From home or self-care           Discharge Plan:  Acute Rehab Placement/Return (not psych) (code 81), Home with Home Health and DME (code 6)    Initial CM  assessment completed.  Pt admitted for left hip fracture from fall at home and is scheduled for repair today.  Pt lives with his wife in a one story house.  Pt states he was independent prior to admission and still driving.  He denies HH and DME prior to admission.  CM discussed inpt rehab after discharge.  Pt is not agreeable to this.  He states he wants to go home with [facility] for PT services.  He is choosing [facility].  [facility] referral sent through careport.  HH order pended in EPIC along with FWW and B[facility].  Pt states discharge goals are home with [facility] and DME.  Order inniated in parachute for DME with [employer]. Tonya from [employer] notified.      The patient will continue to be evaluated for developing discharge needs.     Case Manager: Glade Molt, RN  Phone:  [phone]

## 2023-12-13 NOTE — Nurses Notes (Signed)
 Radiology at bedside for x ray

## 2023-12-13 NOTE — OT Evaluation (Signed)
 [facility]  [address]  [place], [address]  [phone]  (Fax) [phone]  Rehabilitation Services  Occupational Therapy     Patient Name: Corey [name]  Date of Birth: 1946-03-26  Height: Height: 170.2 cm (5' 7)  Weight: Weight: 70.4 kg (155 lb 4.8 oz)  Room/Bed: 372/A  Payor: [employer] MEDICARE ADVANTAGE / Plan: [employer] MEDIC[employer] ADVANTAGE PPO / Product Type: PPO /     PATIENT DID NOT PARTICIPATE IN THERAPY TODAY DUE TO: INAPPROPRIATE AT THIS TIME Patient having ortho surgery today. Will follow up after surgery.        Tenae Graziosi, OT 12/13/2023,07:00

## 2023-12-13 NOTE — OR Surgeon (Signed)
 ORTHOPAEDIC SURGERY OPERATIVE REPORT:    Patient Name: Corey [name]  Date of Birth: [birth year]  Age: 78 y.o.  Gender: male  MRN: [id number]    Date of Surgery: 12/13/2023     Surgeon: Mabel Spence, DO    Preoperative Diagnosis: Closed displaced left femoral neck fracture    Postoperative Diagnosis: Closed displaced left femoral neck fracture    Procedure(s):  Left hip hemiarthroplasty    Type of Anesthesia: General    Anesthesia Staff: Anesthesiologist: Lenetta Redell ORN, MD  CRNA: Jenise Service, CRNA   OR Staff: Circulator: Ira Macintosh, RN  PERIOPERATIVE CARE ASSISTANT: Nicholaus Area, PCA  Scrub Person: Suellen Pearson, ST; [place], [place], [place]; Larwance Drummer, CST   First Assist:  Asher Level    FA Function: Physician placed the retractor and FA held retractors on soft tissue.    Implants: [employer] accolade    Implant Name Type Inv. Item Serial No. Manufacturer Lot No. LRB No. Used Action   SUTURE 5 CCS-1 FIBERWIRE 38IN BLU BRD TIE MS LWR KNT PROF NONAB - [id number]  SUTURE 5 CCS-1 FIBERWIRE 38IN BLU BRD TIE MS LWR KNT PROF NONAB  [employer]  Left 1 Implanted   STEM FEM ACCOLADE V40 PRFT PUREFIX TI HIP 132D 6 STD OFST STRL - [id number]  STEM FEM ACCOLADE V40 PRFT PUREFIX TI HIP 132D 6 STD OFST STRL  [employer] [id number] Left 1 Implanted   HEAD UHR COCR UHMWPE HIP FEM UNIV BIPOLAR  - [id number]  HEAD UHR COCR UHMWPE HIP FEM UNIV BIPOLAR   [employer] [id number] Left 1 Implanted   HEAD V40 LFIT +0MM OFST TAPER HIP COCR FEM PRIM STRL - [id number] JOINT COMMPONENT HEAD V40 LFIT +0MM OFST TAPER HIP COCR FEM PRIM STRL  [employer] [id number] Left 1 Implanted        Estimated Blood Loss:  100 mL    Counts:     Sponge: Correct    Needle: Correct    Sharp: Correct    Instrument: Correct    Drains and/or Packs: None    Significant Events: None    Specimen(s) Removed: * No specimens in log *     Description of Findings: Routine findings  consistent with pre-operative diagnosis.    Post-Op Condition of Patient: Stable to PACU    Brief History:     Corey [name] is a 78 y.o. male who presents to [facility] for left hip pain status post fall.  Diagnosed with a left femoral neck fracture for which Orthopedics was consulted.  Patient seen and evaluated treatment options discussed.  Recommended proceeding with operative stabilization consisting of a left hip bipolar.      Procedure discussed in great detail.  Risks, benefits, potential complications and outcomes were discussed at length and patient verbalized an understanding and provided informed consent to proceed. Patient underwent appropriate pre-operative clearance and is stable to proceed.    Procedure:     Patient seen and examined in the preoperative area.  H&P reviewed.  Written consent confirmed and posted on the chart.  Appropriate preoperative antibiotics were administered at this time.  Left hip marked as the correct operative site.  Patient was then taken back to the operative suite and transferred to the OR table in the supine position.  Anesthesia then administered. Patient then positioned in the lateral decubitus position with operative hip up. All bony prominences covered and padded appropriately.  Operative hip and lower extremity prepped  and draped in normal sterile fashion. Formal timeout then completed.    Upon completion of timeout longitudinal incision centered over the greater trochanter was carried out.  Sharp dissection carried down to the level of the IT band.  Electrocautery used to achieve hemostasis.  IT band was incised sharply in line with skin incision. Charnley retractor was then placed.  Bursa was excised sharply.  We then split the anterior 3rd of the gluteus medius in line with its fibers and separated it from the posterior two thirds.  We then performed our abductor peel using the Bovie down to the level of the lesser trochanter creating a full thickness sleeve of  muscle and tissue.  Capsulotomy was performed.  Fracture site was identified.  Our osteotomy was then made using the saw at the appropriate level approximately 1 fingerbreadth proximal to the lesser trochanter.    At this time we turned our attention to retrieving femoral head.  Femoral head was retrieved using the corkscrew.  Acetabulum was visualized and there was no evidence of loose body or fracture.  The acetabulum entire wound was thoroughly irrigated using pulse lavage at this time.  Femoral head was passed to the back table for measurement.    We then turned our attention back to the femur.  Box osteotome was used followed by the opening Reamer/canal finder to gain entry into the femoral canal.  We then used the trochanteric Reamer to lateralize proximally.  Femur was then broached in the appropriate version to the appropriate size.  We did appreciate pitch change upon reaching the appropriately sized broach and proper axial and rotational stability of the implant.  Final broach was then removed and entire wound including the femur was thoroughly irrigated with pulse lavage as well as Irrisept solution.      Final stem was opened and inserted to the appropriate depth.  We once again confirmed satisfactory axial and rotational stability of the stem.  At this time we trialed the femoral head and bipolar component.  Hip was reduced and leg length was found to be appropriate.  Hip was taken through range of motion and found to be stable throughout with appropriate tension on the abductors.  Hip was dislocated and trial removed.  Stem irrigated with Irrisept solution. The final head and bipolar components were inserted and hip was once again reduced.  Hip once again taken through range of motion and found to be stable throughout with appropriate leg length and tension.      The wound was once again thoroughly irrigated using pulse lavage and IrriSept solution.  We then performed closure of our superior capsule  using 0 Vicryl in figure-of-eight fashion.  We performed our abductor repair using 5. FiberWire in a running locked fashion.  IT band was then approximated using a combination of 0 Vicryl in figure-of-eight fashion and a Stratafix barbed suture.  Subcutaneous tissue closed with 2-0 Vicryl in buried interrupted fashion.  Skin was approximated using staples.  Incision was then covered with a Mepilex dressing.  Abduction pillow was then placed.  Patient was then awoken from anesthesia and transferred back to the hospital gurney.  Taken to PACU in stable and satisfactory condition.  Please note the patient tolerated the procedure well without complication       Disposition:     Patient taken to PACU in stable and satisfactory condition. Patient will be transferred back to the hospital floor once cleared per PACU protocol.  May remain weight-bearing as tolerated  to the operative extremity with use of an assistive device as needed for ambulation.  Maintain dressing, reinforced as needed.  Perioperative antibiotics ordered.  Analgesia.  Physical therapy and DVT prophylaxis to begin on postoperative day 1.  Hip dislocation precautions for the next 6 weeks. Anticipated discharge to rehab facility.  Orthopedics will continue to follow.  Please page with questions or concerns.    Karleen Spence, D.O.  12/13/23 20:09  [facility]  Please call with any questions/concerns     This note has been created with voice recognition software.  Please excuse any errors in transcription.  Occasional wrong word or sound alike substitutions may have occurred due to the inherent limitations of voice recognition software.  Please read the chart carefully and recognize using context with the substitutions may have occurred.

## 2023-12-13 NOTE — Nurses Notes (Signed)
 Patients family took walker and bedside commode with them

## 2023-12-13 NOTE — Anesthesia Postprocedure Evaluation (Signed)
 Anesthesia Post Op Evaluation    Patient: Corey [name]  Procedure(s):  LEFT HIP HEMI ARTHROPLASTY BIPOLAR USING ACCOLADE IMPLANTS    Last Vitals:Temperature: 37 C (98.6 F) (12/13/23 2025)  Heart Rate: 85 (12/13/23 2038)  BP (Non-Invasive): 124/73 (12/13/23 2038)  Respiratory Rate: 15 (12/13/23 2038)  SpO2: 97 % (12/13/23 2038)    No notable events documented.      Patient location during evaluation: PACU       Patient participation: complete - patient participated  Level of consciousness: awake    Pain management: adequate    Anesthetic complications: no  Cardiovascular status: acceptable  Respiratory status: acceptable and nasal cannula  Hydration status: acceptable  Patient post-procedure temperature: Pt Normothermic   PONV Status: Absent

## 2023-12-13 NOTE — Consults (Signed)
 ORTHOPAEDIC SURGERY CONSULT NOTE:    Patient Name: Corey [name]   MRN: [id number]   Date of Birth: 12-Jun-1946  Age: 78 y.o.    Gender: male  Attending Physician: Lendia Riesa Skiff, MD    Date of Consultation: 12/13/2023    Reason for consult:  Left hip fracture     Subjective:    HPI:  Corey [name] is a 78 y.o. male who presents to [facility] on 12/12/2023 status post mechanical fall with resultant left hip pain and inability to ambulate.  Brought to the ER where imaging demonstrated left femoral neck fracture.  Admitted to the Internal Medicine Service.  Orthopedic consulted for operative intervention.  Patient seen and evaluated this morning for consultation.  Has been NPO since midnight.  States that he sustained trip and fall while going from the bedroom to the bathroom with resultant injury to the left hip.  Pain localized to the left hip and proximal thigh this morning.  Denies paralysis or paresthesias.  Denies pain distal to the hip in the left lower extremity.  Of note patient states that he had an injury as a child for which he had multiple procedures on the left knee.  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  Also reports chronic stiffness with limited flexion of the left knee.  Denied any trauma to the head or neck.  Denies loss of consciousness.  No syncopal episode.  Not on blood thinners.    REVIEW OF SYSTEMS  General: No fatigue, fevers, chills  Ear, nose and throat: No dysphagia or nosebleeds.   Eyes: No change in vision.   Cardiovascular: No chest pain, dyspnea on exertion. No abnormal heartbeat.    Lung: No cough or shortness of breath.   Skin: No rash.   Neurological: No seizures, tremors or headaches.   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    [place]  Dr Skipper    Hypercholesterolemia     Hypertension     IBS (irritable bowel syndrome)     Left foot pain 12/21/2021    Leukocytosis     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    Narcolepsy     Neck pain 12/21/2021    Nephritis 12/21/2021    OSA (obstructive sleep apnea)     Osteoarthritis     Paresthesia 12/21/2021    Previous back surgery 12/21/2021    Right carotid bruit     Right hip pain 12/21/2021    Thigh pain 12/21/2021    Thyroid nodule     Weight loss, non-intentional 12/21/2021        PSH:  Past Surgical History:   Procedure Laterality Date    Cardiac catheterization  07/13/2021    Dental surgery      Hx back surgery  Hx colonoscopy  2019    Hx coronary artery bypass graft  08/03/2021    Hx hernia repair      Hx lap cholecystectomy      Orthopedic surgery      Prostate surgery  10/2022        ALLERGIES:  Allergies[1]     Social History:  Social History     Socioeconomic History    Marital status: Married     Spouse name: Sherrilyn    Number of children: 2    Years of education: 12+    Highest education level: Bachelor's degree (e.g., BA, AB, BS)   Tobacco Use    Smoking status: Every Day     Current packs/day: 1.00     Average packs/day: 1 pack/day for 56.5 years (56.5 ttl pk-yrs)     Types: Cigarettes     Start date: 1969    Smokeless tobacco: Never    Tobacco comments:     Down to half a pack a day   Vaping Use    Vaping status: Never Used   Substance and Sexual Activity    Alcohol use: Yes     Alcohol/week: 3.0 standard drinks of alcohol     Types: 3 Cans of beer per week     Comment: occasionally    Drug use: Never     Social Determinants of Health     Financial Resource Strain: Low Risk  (06/15/2022)    Financial Resource Strain     SDOH  Financial: No   Transportation Needs: Low Risk  (06/15/2022)    Transportation Needs     SDOH Transportation: No   Social Connections: Low Risk  (12/12/2023)    Social Connections     SDOH Social Isolation: 5 or more times a week   Intimate Partner Violence: Low Risk  (06/15/2022)    Intimate Partner Violence     SDOH Domestic Violence: No   Housing Stability: Low Risk  (06/15/2022)    Housing Stability     SDOH Housing Situation: I have housing.     SDOH Housing Worry: No        Objective:    Last Filed Vitals:  Filed Vitals:    12/12/23 2329 12/13/23 0356 12/13/23 0520 12/13/23 0746   BP: (!) 144/67 128/74  (!) 124/53   Pulse: 76 74  66   Resp: 18 18 18 17    Temp: 37.2 C (99 F) 37 C (98.6 F)  36.6 C (97.8 F)   SpO2: 93% 94%  95%        Constitutional: Appears comfortable, no acute distress.  Alert and oriented x 3 .  Cardiovascular: Regular rate, no peripheral edema.  Respiratory: Normal respiratory effort  Head: NCAT    Musculoskeletal Exam:     Left lower extremity:  No deformity appreciated.  No open wounds abrasions ecchymosis or significant swelling.  Positive logroll and Stinchfield on the left.  Globally tender to palpation about the left hip.  Nontender to palpation with no crepitance appreciated distal to the hip in the left lower extremity.  Large anterior incision on the knee and distal thigh healed without evidence of acute complication.  Compartments of the thigh and leg soft and compressible.  Negative Homan or evidence of deep vein thrombosis.  Motor and sensory exam intact and unremarkable.  Limb warm and well perfused with brisk capillary refill    Moves all other extremities without significant difficulty.    Radiographs:  Results for orders placed or performed during the hospital encounter of 12/12/23 (from the past 72 hours)   XR KNEE LEFT 2 VIEW *Canceled*     Status: None ()    Narrative    The following orders were created for panel order XR KNEE LEFT 2 VIEW.  Procedure                                Abnormality         Status                     ---------                               -----------         ------                       Please view results for these tests on the individual orders.   XR KNEE LEFT 2 VIEW *Canceled*     Status: None ()    Narrative    The following orders were created for panel order XR KNEE LEFT 2 VIEW.  Procedure                               Abnormality         Status                     ---------                               -----------         ------                       Please view results for these tests on the individual orders.   XR KNEE LEFT 4 OR MORE VIEWS     Status: None    Narrative    Narvel HARLEY [place]    RADIOLOGIST: Oneil LITTIE Raw, MD    XR KNEE LEFT 4 OR MORE VIEWS performed on 12/12/2023 11:06 AM    CLINICAL HISTORY: fall with injury.  fell    TECHNIQUE:  4 view(s) of the left knee    COMPARISON:  04/28/2014    FINDINGS:   No fracture.  No suspicious bone lesion.  Severe degenerative changes.  No effusion.  Surgical clips are present in the upper left calf. Arterial vascular calcifications.        Impression    NO VISIBLE FRACTURE.  IF THERE IS ONGOING CLINICAL SUSPICION FOR FRACTURE, CONSIDER FOLLOWUP IMAGING IN 7-10 DAYS.           Radiologist location ID: [id number]     XR HIP LEFT W PELVIS 2-3 VIEWS     Status: None    Narrative    Max HARLEY [place]    RADIOLOGIST: Lupita DELENA Beals, MD    XR HIP LEFT W PELVIS 2-3 VIEWS performed on 12/12/2023 11:06 AM    CLINICAL HISTORY: fall with injury.  fell    TECHNIQUE:  3 views of the left hip including AP pelvis.    COMPARISON:  None.    FINDINGS:   There is deformity at the  level of the left femoral neck with a suspected fracture. This should be further evaluated with CT imaging.  No gross malalignment.  Soft tissues are unremarkable.        Impression    DEFORMITY AT THE LEVEL OF THE LEFT FEMORAL NECK WITH A SUSPECTED FRACTURE. THIS SHOULD BE FURTHER EVALUATED WITH CT IMAGING.          Radiologist location  ID: [id number]     CT CERVICAL SPINE WO IV CONTRAST     Status: None    Narrative    Rajah HARLEY Giuffre    RADIOLOGIST: Franchot Dale Scales, MD    CT CERVICAL SPINE WO IV CONTRAST performed on 12/12/2023 11:19 AM    CLINICAL HISTORY: fall with injury  fall with head injury   pt hit upper forehead  Unable to clear cervical spine clinically.    TECHNIQUE:  Cervical spine CT without contrast.      COMPARISON: None.    FINDINGS:  Alignment: Normal    Vertebrae: No acute fracture. 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 Advanced lower cervical degenerative disc disease. Advanced mid cervical facet arthropathy on the left.    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      Impression    NO ACUTE CERVICAL FRACTURE          Radiologist location ID: [id number]     CT BRAIN WO IV CONTRAST     Status: None    Narrative    Dillyn HARLEY [place]en    RADIOLOGIST: Oneil LITTIE Raw, MD    CT BRAIN WO IV CONTRAST performed on 12/12/2023 11:20 AM    CLINICAL HISTORY: fall with head injury  fall with head injury  pt hit upper forehead    TECHNIQUE:  Head CT without intravenous contrast.    COMPARISON: 04/14/2022    FINDINGS:  There is no acute intracranial hemorrhage, mass effect, or evidence of large acute infarct.    Brain: Low density in the periventricular white matter suggests mild chronic small vessel ischemic changes.    CSF Spaces: Mild generalized cerebral atrophy     Sinuses/Mastoids:  Clear at visualized levels     Bones: Unremarkable        Impression    NO ACUTE FINDINGS      Radiologist location ID: [id number]     CT HIP LEFT WO IV CONTRAST     Status: None    Narrative    Hiram HARLEY [place]    RADIOLOGIST: Arley Blow, MD    CT HIP LEFT WO IV CONTRAST performed on 12/12/2023 1:54 PM    CLINICAL HISTORY: Questionable fracture, left hip pain status post fall.  Abnormal x-ray  Questionable fracture, left hip pain status post fall    TECHNIQUE:  Left hip CT without  intravenous contrast.      COMPARISON: Left hip series from today    FINDINGS:  Bones: There is a mildly impacted nondisplaced left femoral neck fracture    Joints: There is some left superior hip narrowing    Soft Tissues: There are vascular calcifications with some retained stool in the colon.        Impression    LEFT FEMORAL NECK FRACTURE      One or more dose reduction techniques were used (e.g., Automated exposure control, adjustment of the mA and/or kV according to patient size, use of iterative reconstruction technique).      Radiologist  location ID: [id number]     XR AP MOBILE CHEST     Status: None    Narrative    Devondre HARLEY Hipp      PROCEDURE DESCRIPTION: XR PORTABLE CHEST X-RAY    CLINICAL HISTORY:medical clearance    COMPARISON:12/17/2013          FINDINGS:  A single view of the chest was obtained. No focal alveolar infiltrate is identified. The patient is status post coronary artery bypass graft surgery. The heart size is within limits of normal. No pleural effusion or pneumothorax is seen.            Impression    No focal alveolar infiltrate is identified.      Radiologist location ID: [id number]     XR FEMUR LEFT     Status: None (Preliminary result)    Narrative    Gunnison HARLEY [place]    RADIOLOGIST: Darryle Mayers    XR FEMUR LEFT- 2 VIEWS performed on 12/12/2023 3:43 PM    CLINICAL HISTORY: fracture.  Fall after being hit by bull    TECHNIQUE:  4 views of the left femur.    COMPARISON:  Left knee same date. CT left hip same date. The CT showed left subcapital femoral neck fracture.    FINDINGS:     There are some surgical clips projecting over the posterior medial upper calf. There are also some surgical clips in the upper medial thigh and over the left region.  This study shows acute appearing subcapital left hip fracture about the same as on the CT same date. No other acute fracture is seen. No fracture seen in the more distal femur.  Hip joint space and femoral head itself appears  intact. Knee joint appears intact with severe medial compartment narrowing.  Atherosclerotic changes are noted.        Impression    There is a subcapital left hip fracture with comparable appearance on today's CT.    No more distal left femur acute rib abnormality demonstrated    Degenerative changes in the knee. Please see report of left knee study same date.    Atherosclerotic changes noted in the femoral vessels.               Radiologist location ID: [id number]          Lab Data   Results from last 7 days   Lab Units 12/13/23  0330 12/12/23  1040   CO2 mmol/L 23 27   BUN mg/dL 16 17   CREA mg/dL 9.13 9.02   CA mg/dL 8.6 8.6   ALKP U/L  --  82   SGPT U/L  --  19   SGOT U/L  --  19     Lab Results   Component Value Date    WBC 10.9 (H) 12/13/2023    HGB 13.1 12/13/2023    HCT 37.4 12/13/2023     Lab Results   Component Value Date    APTT 29.9 12/12/2023    INR 0.96 12/12/2023     No results found for: TROPONINI, BNP  No results found for: HGBA1C    Microbiology:    No results found for: URINECX, FLUA, FLUB        Impression and Plan:    Impression:   Left femoral neck fracture     Plan:  Imaging reviewed patient does have evidence of a displaced left femoral neck fracture.  Treatment options were discussed and I did recommend  proceeding with operative stabilization consisting of a left hip bipolar.  Procedure discussed in great detail.  Risks benefits potential complications and outcomes discussed and patient verbalized understanding and provided informed consent proceed.  Plan for surgery later today  Patient to remain NPO  Hold pharmacologic anticoagulation  Analgesia  Further plan to come in postoperative note  Remainder of care per primary service      Karleen Spence, D.O.  12/13/23 07:56  [facility]  Please call with any questions/concerns    This note has been created with voice recognition software.  Please excuse any errors in transcription.  Occasional wrong word  or sound alike substitutions may have occurred due to the inherent limitations of voice recognition software.  Please read the chart carefully and recognize using context with the substitutions may have occurred.           [1]   Allergies  Allergen Reactions    Penicillins  Other Adverse Reaction (Add comment) and Rash     Childhood allergy, unknown reaction

## 2023-12-13 NOTE — Anesthesia Transfer of Care (Signed)
 ANESTHESIA TRANSFER OF CARE   Corey [name] is a 78 y.o. ,male, Weight: 70.4 kg (155 lb 4.8 oz)   had Procedure(s):  LEFT HIP HEMI ARTHROPLASTY BIPOLAR USING ACCOLADE IMPLANTS  performed  12/13/23   Primary Service: Riesa Jess Meals, MD    Past Medical History:   Diagnosis Date   . Abnormal laboratory test    . Abnormal prostate specific antigen    . Anxiety    . Arteriosclerotic vascular disease    . Atypical chest pain    . BPH (benign prostatic hyperplasia)    . Cancer (CMS HCC)     skin   . Coronary artery disease    . Depression    . Diarrhea    . Esophageal reflux    . Fatigue 08/29/2021   . 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 Hypercholesterolemia    . Hypertension    . IBS (irritable bowel syndrome)    . Left foot pain 12/21/2021   . Leukocytosis    . 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   . 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 Narcolepsy    . Neck pain 12/21/2021   . Nephritis 12/21/2021   . OSA (obstructive sleep apnea)    . Osteoarthritis    . Paresthesia 12/21/2021   . Previous back surgery 12/21/2021   . Right carotid bruit    . Right hip pain 12/21/2021   . Thigh pain 12/21/2021   . Thyroid nodule    . Weight loss, non-intentional 12/21/2021      Allergy History as of 12/13/23       PENICILLINS         Noted Status Severity Type Reaction    11/15/22 0801 Trudy Remak, MA 10/26/09 Active Medium   Other Adverse Reaction (Add comment), Rash    Comments: Childhood allergy, unknown reaction     09/14/22 0840 Trudy Remak, MA 10/26/09 Active Medium   Other Adverse Reaction (Add comment)    Comments: Childhood allergy, unknown reaction     10/10/21 0959 Honora Haver, NURSE EXTERN 10/26/09 Active Medium   Other Adverse Reaction (Add comment)    Comments: Childhood allergy, unknown reaction     10/26/09 0743 Gretel Jon SAUNDERS, RN 10/26/09 Active                     I completed my transfer of care / handoff to the receiving personnel during which we  discussed:  Access, Airway, All key/critical aspects of case discussed, Analgesia, Antibiotics, Expectation of post procedure, Fluids/Product, Gave opportunity for questions and acknowledgement of understanding, Labs and PMHx      Post Location: PACU                                                           Last OR Temp: Temperature: 37 C (98.6 F)  ABG:  POTASSIUM   Date Value Ref Range Status   12/13/2023 3.8 3.5 - 5.1 mmol/L Final     CALCIUM   Date Value Ref Range Status   12/13/2023 8.6 8.6 - 10.3 mg/dL Final     Calculated P Axis   Date Value Ref Range Status   12/12/2023 76 degrees Final  Calculated R Axis   Date Value Ref Range Status   12/12/2023 74 degrees Final     Calculated T Axis   Date Value Ref Range Status   12/12/2023 77 degrees Final     Airway:* No LDAs found *  Blood pressure (!) 141/67, pulse 78, temperature 37 C (98.6 F), resp. rate 17, height 1.702 m (5' 7), weight 70.4 kg (155 lb 4.8 oz), SpO2 100%.

## 2023-12-13 NOTE — Nurses Notes (Signed)
 Report called to Rexene on [address].  Patient transported to room 372 in patient bed on floor tele monitor.  RN present at bedside as well as family in room.  Patient denies any pain, dressing C/D/I.  Vitals as follows:  98.17f, HR 83, R 15, 94%on 3L NC, 126/65.  Care endorsed to 3 [facility] LPN at [phone]

## 2023-12-13 NOTE — Care Plan (Signed)
 Problem: Adult Inpatient Plan of Care  Goal: Plan of Care Review  Outcome: Ongoing (see interventions/notes)  Goal: Patient-Specific Goal (Individualized)  Outcome: Ongoing (see interventions/notes)  Goal: Absence of Hospital-Acquired Illness or Injury  Outcome: Ongoing (see interventions/notes)  Intervention: Identify and Manage Fall Risk  Recent Flowsheet Documentation  Taken 12/13/2023 0000 by Rexene DEL, RN  Safety Promotion/Fall Prevention:   safety round/check completed   nonskid shoes/slippers when out of bed   fall prevention program maintained  Taken 12/12/2023 2200 by Rexene DEL, RN  Safety Promotion/Fall Prevention:   safety round/check completed   nonskid shoes/slippers when out of bed   fall prevention program maintained  Taken 12/12/2023 2000 by Rexene DEL, RN  Safety Promotion/Fall Prevention:   safety round/check completed   nonskid shoes/slippers when out of bed   fall prevention program maintained  Intervention: Prevent Skin Injury  Recent Flowsheet Documentation  Taken 12/12/2023 2200 by Rexene DEL, RN  Body Position: other (see comments)  Taken 12/12/2023 2000 by Rexene DEL, RN  Body Position: other (see comments)  Intervention: Prevent Infection  Recent Flowsheet Documentation  Taken 12/13/2023 0000 by Rexene DEL, RN  Infection Prevention:   single patient room provided   rest/sleep promoted   promote handwashing  Taken 12/12/2023 2200 by Rexene DEL, RN  Infection Prevention:   single patient room provided   rest/sleep promoted   promote handwashing  Taken 12/12/2023 2000 by Rexene DEL, RN  Infection Prevention:   rest/sleep promoted   promote handwashing   single patient room provided  Goal: Optimal Comfort and Wellbeing  Outcome: Ongoing (see interventions/notes)  Goal: Rounds/Family Conference  Outcome: Ongoing (see interventions/notes)     Problem: Orthopaedic Fracture  Goal: Absence of Bleeding  Outcome: Ongoing (see interventions/notes)  Goal: Bowel Elimination  Outcome: Ongoing (see interventions/notes)  Goal: Absence of Embolism  Signs and Symptoms  Outcome: Ongoing (see interventions/notes)  Goal: Fracture Stability  Outcome: Ongoing (see interventions/notes)  Goal: Optimal Functional Ability  Outcome: Ongoing (see interventions/notes)  Goal: Absence of Infection Signs and Symptoms  Outcome: Ongoing (see interventions/notes)  Goal: Effective Tissue Perfusion  Outcome: Ongoing (see interventions/notes)  Goal: Optimal Pain Control and Function  Outcome: Ongoing (see interventions/notes)  Goal: Effective Oxygenation and Ventilation  Outcome: Ongoing (see interventions/notes)     Problem: Fall Injury Risk  Goal: Absence of Fall and Fall-Related Injury  Outcome: Ongoing (see interventions/notes)  Intervention: Promote Injury-Free Environment  Recent Flowsheet Documentation  Taken 12/13/2023 0000 by Rexene DEL, RN  Safety Promotion/Fall Prevention:   safety round/check completed   nonskid shoes/slippers when out of bed   fall prevention program maintained  Taken 12/12/2023 2200 by Rexene DEL, RN  Safety Promotion/Fall Prevention:   safety round/check completed   nonskid shoes/slippers when out of bed   fall prevention program maintained  Taken 12/12/2023 2000 by Rexene DEL, RN  Safety Promotion/Fall Prevention:   safety round/check completed   nonskid shoes/slippers when out of bed   fall prevention program maintained     Problem: Skin Injury Risk Increased  Goal: Skin Health and Integrity  Outcome: Ongoing (see interventions/notes)   Care plan ongoing. Fall precautions in place. Reoriented as needed. Medicated for pain as needed. Vital signs and labs monitored. NPO for surgery tomorrow.

## 2023-12-13 NOTE — Anesthesia Preprocedure Evaluation (Addendum)
 ANESTHESIA PRE-OP EVALUATION  Planned Procedure: LEFT HIP HEMI ARTHROPLASTY BIPOLAR USING ACCOLADE IMPLANTS (Left: Hip)  Review of Systems     anesthesia history negative     patient summary reviewed  nursing notes reviewed        Pulmonary  negative pulmonary ROS,    Cardiovascular    Hypertension, well controlled, murmur, CAD, ECG reviewed, CABG and hyperlipidemia ,No peripheral edema,        GI/Hepatic/Renal    GERD and well controlled        Endo/Other    osteoarthritis,      Neuro/Psych/MS    anxiety, depression     Cancer  CA,                       Physical Assessment      Airway       Mallampati: II    TM distance: >3 FB    Neck ROM: full  Mouth Opening: good.  Facial hair  Beard    No Tracheostomy present    Dental           (+) caps           Pulmonary    Breath sounds clear to auscultation  (-) no rhonchi, no decreased breath sounds, no wheezes, no rales and no stridor     Cardiovascular    Rhythm: regular  Rate: Normal  (-) no friction rub, carotid bruit is not present, no peripheral edema and no murmur     Other findings              Plan  ASA 3     Planned anesthesia type: general     general anesthesia with endotracheal tube intubation      PONV Plan:  I plan to administer pharmcologic prophalaxis antiemetics              Intravenous induction     Anesthesia issues/risks discussed are: Dental Injuries, PONV, Stroke, Aspiration, Blood Loss, Sore Throat, Cardiac Events/MI, Intraoperative Awareness/ Recall and Art Line Placement.  Anesthetic plan and risks discussed with patient and spouse  signed consent obtained      Use of blood products discussed with patient and spouse who.      Patient's NPO status is appropriate for Anesthesia.           Plan discussed with CRNA.

## 2023-12-13 NOTE — Progress Notes (Signed)
 [facility]  Progress Note    Corey [name]  Date of service: 12/13/2023  Date of Admission:  12/12/2023  Hospital Day:  LOS: 1 day     Subjective:  Patient is seen in follow-up on 12/13/2023: He is lying in the bed, visiting with family.  Scheduled for surgery today.  He has been kept NPO.  Reports pain has been well-controlled.    Review of Systems:    All systems were reviewed, pertinent positive and negative as mentioned in subjective.    Objective:      Vital Signs:  Vitals:    12/13/23 0746 12/13/23 0757 12/13/23 1032 12/13/23 1054   BP: (!) 124/53 (!) 124/53  139/66   Pulse: 66 66 67 73   Resp: 17   17   Temp: 36.6 C (97.8 F)   36.6 C (97.9 F)   SpO2: 95%   97%   Weight:       Height:       BMI:                I/O:  I/O last 24 hours:    Intake/Output Summary (Last 24 hours) at 12/13/2023 1105  Last data filed at 12/13/2023 0600  Gross per 24 hour   Intake --   Output 750 ml   Net -750 ml           Physical Exam:    Constitutional:  Patient is lying in the bed, not in any distress appears comfortable.  HENT: Mucus membranes moist.  No oral ulcer.  Head: Normocephalic and atraumatic.   Mouth/Throat: Oropharynx is clear and moist.   Cardiovascular:  S1-S2 audible,   Pulmonary/Chest:  Bilaterally air entry  Abdominal: Soft. Bowel sounds are normal. Patient exhibits no distension. There is no abdominal tenderness. There is no rebound and no guarding.   Musculoskeletal: Normal range of motion.  Left hip tender    Neurological:  Patient is awake, alert, oriented X 3, cranial nerves intact, moving all extremities, nonfocal examination  Skin: Skin is warm and dry. No erythema.   Psychiatric: Affect and judgment normal.     acetaminophen (TYLENOL) tablet, 650 mg, Oral, Q4H PRN  atorvastatin  (LIPITOR) tablet, 40 mg, Oral, QPM  benztropine (COGENTIN) tablet, 1 mg, Oral, NIGHTLY  ezetimibe  (ZETIA ) tablet, 10 mg, Oral, QPM  famotidine  (PEPCID ) tablet, 40 mg, Oral, 2x/day  HYDROcodone-acetaminophen  (NORCO) 7.5 mg-325 mg per tablet, 1 Tablet, Oral, Q4H PRN  losartan  (COZAAR ) tablet, 50 mg, Oral, 2x/day  metoprolol  succinate (TOPROL -XL) 24 hr extended release tablet, 12.5 mg, Oral, Daily  nicotine (NICODERM CQ) transdermal patch (mg/24 hr), 21 mg, Transdermal, Q24H  ondansetron (ZOFRAN) 2 mg/mL injection, 4 mg, Intravenous, Q6H PRN  pantoprazole (PROTONIX) delayed release tablet, 40 mg, Oral, NIGHTLY  sertraline  (ZOLOFT ) tablet, 100 mg, Oral, NIGHTLY  tamsulosin  (FLOMAX ) capsule, 0.4 mg, Oral, NIGHTLY      Labs:  Results for orders placed or performed during the hospital encounter of 12/12/23 (from the past 24 hours)   TYPE AND SCREEN    Collection Time: 12/12/23  3:04 PM   Result Value Ref Range    UNITS ORDERED NOT STATED     ABO/RH(D) A POSITIVE     ANTIBODY SCREEN NEGATIVE     SPECIMEN EXPIRATION DATE 12/15/2023,2359    CBC/DIFF    Collection Time: 12/13/23  3:30 AM    Narrative    The following orders were created for panel order CBC/DIFF.  Procedure  Abnormality         Status                     ---------                               -----------         ------                     CBC WITH IPQQ[[id number]]                Abnormal            Final result                 Please view results for these tests on the individual orders.   BASIC METABOLIC PANEL, NON-FASTING    Collection Time: 12/13/23  3:30 AM   Result Value Ref Range    SODIUM 138 136 - 145 mmol/L    POTASSIUM 3.8 3.5 - 5.1 mmol/L    CHLORIDE 108 (H) 98 - 107 mmol/L    CO2 TOTAL 23 21 - 31 mmol/L    ANION GAP 7 4 - 13 mmol/L    CALCIUM 8.6 8.6 - 10.3 mg/dL    GLUCOSE 890 74 - 890 mg/dL    BUN 16 7 - 25 mg/dL    CREATININE 9.13 9.39 - 1.30 mg/dL    BUN/CREA RATIO 19 6 - 22    ESTIMATED GFR 90 >59 mL/min/1.63m^2    OSMOLALITY, CALCULATED 278 270 - 290 mOsm/kg    Narrative    Estimated Glomerular Filtration Rate (eGFR) is calculated using the CKD-EPI (2021) equation, intended for patients 74 years of age and older. If  gender is not documented or unknown, there will be no eGFR calculation.     MAGNESIUM    Collection Time: 12/13/23  3:30 AM   Result Value Ref Range    MAGNESIUM 1.8 (L) 1.9 - 2.7 mg/dL   CBC WITH DIFF    Collection Time: 12/13/23  3:30 AM   Result Value Ref Range    WBC 10.9 (H) 3.6 - 10.2 x10^3/uL    RBC 4.07 4.06 - 5.63 x10^6/uL    HGB 13.1 12.5 - 16.3 g/dL    HCT 62.5 63.2 - 52.8 %    MCV 91.7 73.0 - 96.2 fL    MCH 32.3 23.8 - 33.4 pg    MCHC 35.2 32.5 - 36.3 g/dL    RDW 86.2 87.8 - 83.7 %    PLATELETS 176 140 - 440 x10^3/uL    MPV 8.5 7.4 - 11.4 fL    NEUTROPHIL % 77 (H) 44 - 74 %    LYMPHOCYTE % 15 15 - 43 %    MONOCYTE % 5 (L) 6 - 14 %    EOSINOPHIL % 2 1 - 8 %    BASOPHIL % 0 0 - 1 %    NEUTROPHIL # 8.40 (H) 1.85 - 7.84 x10^3/uL    LYMPHOCYTE # 1.70 1.00 - 3.00 x10^3/uL    MONOCYTE # 0.60 0.30 - 1.10 x10^3/uL    EOSINOPHIL # 0.20 0.00 - 0.60 x10^3/uL    BASOPHIL # 0.00 0.00 - 0.10 x10^3/uL        Imaging:    Results for orders placed or performed during the hospital encounter of 12/12/23 (from the past 24 hours)   XR KNEE LEFT  4 OR MORE VIEWS     Status: None    Narrative    Armoni HARLEY Robar    RADIOLOGIST: Oneil LITTIE Raw, MD    XR KNEE LEFT 4 OR MORE VIEWS performed on 12/12/2023 11:06 AM    CLINICAL HISTORY: fall with injury.  fell    TECHNIQUE:  4 view(s) of the left knee    COMPARISON:  04/28/2014    FINDINGS:   No fracture.  No suspicious bone lesion.  Severe degenerative changes.  No effusion.  Surgical clips are present in the upper left calf. Arterial vascular calcifications.        Impression    NO VISIBLE FRACTURE.  IF THERE IS ONGOING CLINICAL SUSPICION FOR FRACTURE, CONSIDER FOLLOWUP IMAGING IN 7-10 DAYS.           Radiologist location ID: [id number]     XR HIP LEFT W PELVIS 2-3 VIEWS     Status: None    Narrative    Harish HARLEY Beaird    RADIOLOGIST: Lupita DELENA Beals, MD    XR HIP LEFT W PELVIS 2-3 VIEWS performed on 12/12/2023 11:06 AM    CLINICAL HISTORY: fall with  injury.  fell    TECHNIQUE:  3 views of the left hip including AP pelvis.    COMPARISON:  None.    FINDINGS:   There is deformity at the level of the left femoral neck with a suspected fracture. This should be further evaluated with CT imaging.  No gross malalignment.  Soft tissues are unremarkable.        Impression    DEFORMITY AT THE LEVEL OF THE LEFT FEMORAL NECK WITH A SUSPECTED FRACTURE. THIS SHOULD BE FURTHER EVALUATED WITH CT IMAGING.          Radiologist location ID: [id number]     CT CERVICAL SPINE WO IV CONTRAST     Status: None    Narrative    Kara HARLEY Cerros    RADIOLOGIST: Franchot Dale Scales, MD    CT CERVICAL SPINE WO IV CONTRAST performed on 12/12/2023 11:19 AM    CLINICAL HISTORY: fall with injury  fall with head injury   pt hit upper forehead  Unable to clear cervical spine clinically.    TECHNIQUE:  Cervical spine CT without contrast.      COMPARISON: None.    FINDINGS:  Alignment: Normal    Vertebrae: No acute fracture. 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 Advanced lower cervical degenerative disc disease. Advanced mid cervical facet arthropathy on the left.    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      Impression    NO ACUTE CERVICAL FRACTURE          Radiologist location ID: [id number]     CT BRAIN WO IV CONTRAST     Status: None    Narrative    Jameire HARLEY Lagerstrom    RADIOLOGIST: Oneil LITTIE Raw, MD    CT BRAIN WO IV CONTRAST performed on 12/12/2023 11:20 AM    CLINICAL HISTORY: fall with head injury  fall with head injury  pt hit upper forehead    TECHNIQUE:  Head CT without intravenous contrast.    COMPARISON: 04/14/2022    FINDINGS:  There is no acute intracranial hemorrhage, mass effect, or evidence of large acute infarct.    Brain: Low density in the periventricular white matter suggests mild chronic small vessel ischemic changes.  CSF Spaces: Mild generalized cerebral atrophy     Sinuses/Mastoids:  Clear at  visualized levels     Bones: Unremarkable        Impression    NO ACUTE FINDINGS      Radiologist location ID: [id number]     CT HIP LEFT WO IV CONTRAST     Status: None    Narrative    Raden HARLEY Deerman    RADIOLOGIST: Arley Blow, MD    CT HIP LEFT WO IV CONTRAST performed on 12/12/2023 1:54 PM    CLINICAL HISTORY: Questionable fracture, left hip pain status post fall.  Abnormal x-ray  Questionable fracture, left hip pain status post fall    TECHNIQUE:  Left hip CT without intravenous contrast.      COMPARISON: Left hip series from today    FINDINGS:  Bones: There is a mildly impacted nondisplaced left femoral neck fracture    Joints: There is some left superior hip narrowing    Soft Tissues: There are vascular calcifications with some retained stool in the colon.        Impression    LEFT FEMORAL NECK FRACTURE      One or more dose reduction techniques were used (e.g., Automated exposure control, adjustment of the mA and/or kV according to patient size, use of iterative reconstruction technique).      Radiologist location ID: [id number]     XR AP MOBILE CHEST     Status: None    Narrative    Ester HARLEY Sabino      PROCEDURE DESCRIPTION: XR PORTABLE CHEST X-RAY    CLINICAL HISTORY:medical clearance    COMPARISON:12/17/2013          FINDINGS:  A single view of the chest was obtained. No focal alveolar infiltrate is identified. The patient is status post coronary artery bypass graft surgery. The heart size is within limits of normal. No pleural effusion or pneumothorax is seen.            Impression    No focal alveolar infiltrate is identified.      Radiologist location ID: [id number]     XR FEMUR LEFT     Status: None (Preliminary result)    Narrative    Zebbie HARLEY Leaks    RADIOLOGIST: Darryle Mayers    XR FEMUR LEFT- 2 VIEWS performed on 12/12/2023 3:43 PM    CLINICAL HISTORY: fracture.  Fall after being hit by bull    TECHNIQUE:  4 views of the left femur.    COMPARISON:  Left knee same date. CT left hip  same date. The CT showed left subcapital femoral neck fracture.    FINDINGS:     There are some surgical clips projecting over the posterior medial upper calf. There are also some surgical clips in the upper medial thigh and over the left region.  This study shows acute appearing subcapital left hip fracture about the same as on the CT same date. No other acute fracture is seen. No fracture seen in the more distal femur.  Hip joint space and femoral head itself appears intact. Knee joint appears intact with severe medial compartment narrowing.  Atherosclerotic changes are noted.        Impression    There is a subcapital left hip fracture with comparable appearance on today's CT.    No more distal left femur acute rib abnormality demonstrated    Degenerative changes in the knee. Please see report of left knee study same  date.    Atherosclerotic changes noted in the femoral vessels.               Radiologist location ID: [id number]         Microbiology:  No results found for any visits on 12/12/23 (from the past 96 hours).        Assessment/ Plan:   Active Hospital Problems    Diagnosis    Primary Problem: Hip fracture    Closed left hip fracture    Hyperlipidemia    GERD (gastroesophageal reflux disease)      #Left femur fracture  -orthopedic surgery consultation.  Patient is scheduled for surgery today.  He has been kept NPO.  -optimal pain control, morphine IV  -PT, OT consultation  -hold anticoagulation     # CAD  -history of CABG 3 years ago  -revised cardiac risk index score 6%     # hypertension  # hyperlipidemia  -resume antihypertensive medication losartan , metoprolol   -resume atorvastatin  40 mg daily     # tobacco abuse  -smoking cessation counseling  -nicotine patch p.r.n.       Diet: NPO  Fluids: None  DVT/PE Prophylaxis:  SCD  Ulcer Prophylaxis: Pepcid    Consults:  Orthopedic surgery  Hardware (Lines, Drains, Foley, Tubes): PIV   Activity:  As tolerated   Therapy: PT/OT        Disposition Planning:  To  be determined    Powell Corona, FNP-BC      This note was partially generated using MModal Fluency Direct system, and there may be some incorrect words, spellings, and punctuation that were not noted in checking the note before saving.

## 2023-12-13 NOTE — Discharge Instructions (Addendum)
 Orthopaedic Discharge Instructions:    Weightbear as tolerated to the operative extremity with use of an assistive device as needed upon ambulation    Follow hip dislocation precautions for the next 6 weeks    Maintain surgical dressing until postoperative day 7.  May remove dressing at this time and leave incision open to air if dry.  If there is persistent drainage please cover with a gauze dressing and change daily. Please call the clinic with any erythema or purulent drainage concerning for infection.    May shower, no tub baths until follow-up     Take pain medication and blood thinners as prescribed    Follow-up in the orthopedics clinic at your scheduled appointment in approximately 2-3 weeks.  Please call the office to confirm your appointment.  Please also call with any questions or concerns.      [facility]  [address] [place], [place] [address]  [phone]

## 2023-12-14 DIAGNOSIS — F32A Depression, unspecified: Secondary | ICD-10-CM

## 2023-12-14 LAB — CBC WITH DIFF
BASOPHIL #: 0 x10ˆ3/uL (ref 0.00–0.10)
BASOPHIL %: 0 % (ref 0–1)
EOSINOPHIL #: 0 x10ˆ3/uL (ref 0.00–0.60)
EOSINOPHIL %: 0 % — ABNORMAL LOW (ref 1–8)
HCT: 36 % — ABNORMAL LOW (ref 36.7–47.1)
HGB: 12.4 g/dL — ABNORMAL LOW (ref 12.5–16.3)
LYMPHOCYTE #: 0.6 x10ˆ3/uL — ABNORMAL LOW (ref 1.00–3.00)
LYMPHOCYTE %: 6 % — ABNORMAL LOW (ref 15–43)
MCH: 31.8 pg (ref 23.8–33.4)
MCHC: 34.4 g/dL (ref 32.5–36.3)
MCV: 92.6 fL (ref 73.0–96.2)
MONOCYTE #: 0.3 x10ˆ3/uL (ref 0.30–1.10)
MONOCYTE %: 3 % — ABNORMAL LOW (ref 6–14)
MPV: 8.9 fL (ref 7.4–11.4)
NEUTROPHIL #: 9.3 x10ˆ3/uL — ABNORMAL HIGH (ref 1.85–7.84)
NEUTROPHIL %: 91 % — ABNORMAL HIGH (ref 44–74)
PLATELETS: 161 x10ˆ3/uL (ref 140–440)
RBC: 3.89 x10ˆ6/uL — ABNORMAL LOW (ref 4.06–5.63)
RDW: 13.7 % (ref 12.1–16.2)
WBC: 10.3 x10ˆ3/uL — ABNORMAL HIGH (ref 3.6–10.2)

## 2023-12-14 LAB — BASIC METABOLIC PANEL
ANION GAP: 8 mmol/L (ref 4–13)
BUN/CREA RATIO: 17 (ref 6–22)
BUN: 16 mg/dL (ref 7–25)
CALCIUM: 8.5 mg/dL — ABNORMAL LOW (ref 8.6–10.3)
CHLORIDE: 106 mmol/L (ref 98–107)
CO2 TOTAL: 24 mmol/L (ref 21–31)
CREATININE: 0.95 mg/dL (ref 0.60–1.30)
ESTIMATED GFR: 83 mL/min/1.73mˆ2 (ref >59)
GLUCOSE: 146 mg/dL — ABNORMAL HIGH (ref 74–109)
OSMOLALITY, CALCULATED: 280 mosm/kg (ref 270–290)
POTASSIUM: 4 mmol/L (ref 3.5–5.1)
SODIUM: 138 mmol/L (ref 136–145)

## 2023-12-14 LAB — MAGNESIUM: MAGNESIUM: 1.9 mg/dL (ref 1.9–2.7)

## 2023-12-14 MED ORDER — ASPIRIN 81 MG CHEWABLE TABLET
81.0000 mg | CHEWABLE_TABLET | Freq: Every day | ORAL | Status: DC
Start: 2023-12-14 — End: 2023-12-18
  Administered 2023-12-14 – 2023-12-18 (×5): 81 mg via ORAL
  Filled 2023-12-14 (×5): qty 1

## 2023-12-14 MED ORDER — POLYETHYLENE GLYCOL 3350 17 GRAM ORAL POWDER PACKET
17.0000 g | Freq: Every day | ORAL | Status: DC
Start: 2023-12-14 — End: 2023-12-18
  Administered 2023-12-14 – 2023-12-18 (×5): 17 g via ORAL
  Filled 2023-12-14 (×5): qty 1

## 2023-12-14 NOTE — Care Plan (Signed)
 Problem: Adult Inpatient Plan of Care  Goal: Plan of Care Review  Outcome: Ongoing (see interventions/notes)  Goal: Patient-Specific Goal (Individualized)  Outcome: Ongoing (see interventions/notes)  Goal: Absence of Hospital-Acquired Illness or Injury  Outcome: Ongoing (see interventions/notes)  Goal: Optimal Comfort and Wellbeing  Outcome: Ongoing (see interventions/notes)  Goal: Rounds/Family Conference  Outcome: Ongoing (see interventions/notes)     Problem: Orthopaedic Fracture  Goal: Absence of Bleeding  Outcome: Ongoing (see interventions/notes)  Goal: Bowel Elimination  Outcome: Ongoing (see interventions/notes)  Goal: Absence of Embolism Signs and Symptoms  Outcome: Ongoing (see interventions/notes)  Goal: Fracture Stability  Outcome: Ongoing (see interventions/notes)  Goal: Optimal Functional Ability  Outcome: Ongoing (see interventions/notes)  Goal: Absence of Infection Signs and Symptoms  Outcome: Ongoing (see interventions/notes)  Goal: Effective Tissue Perfusion  Outcome: Ongoing (see interventions/notes)  Goal: Optimal Pain Control and Function  Outcome: Ongoing (see interventions/notes)  Goal: Effective Oxygenation and Ventilation  Outcome: Ongoing (see interventions/notes)     Problem: Fall Injury Risk  Goal: Absence of Fall and Fall-Related Injury  Outcome: Ongoing (see interventions/notes)     Problem: Skin Injury Risk Increased  Goal: Skin Health and Integrity  Outcome: Ongoing (see interventions/notes)     Problem: Wound  Goal: Optimal Coping  Outcome: Ongoing (see interventions/notes)  Goal: Optimal Functional Ability  Outcome: Ongoing (see interventions/notes)  Goal: Absence of Infection Signs and Symptoms  Outcome: Ongoing (see interventions/notes)  Goal: Improved Oral Intake  Outcome: Ongoing (see interventions/notes)  Goal: Optimal Pain Control and Function  Outcome: Ongoing (see interventions/notes)  Goal: Skin Health and Integrity  Outcome: Ongoing (see interventions/notes)  Goal:  Optimal Wound Healing  Outcome: Ongoing (see interventions/notes)

## 2023-12-14 NOTE — Nurses Notes (Signed)
 Sent secure chat message to E.Claudene, NP notifying him of the following: patient had an elevated blood pressure of 184/79 at 2055, gave him his scheduled losartan  and prn Norco for 8/10 hip pain, patient would not stop moving in bed when obtaining blood pressure

## 2023-12-14 NOTE — PT Evaluation (Signed)
 [facility]  [address]  [place], [address]  [phone]  (Fax) [phone]  Rehabilitation Services  Physical Therapy Inpatient Initial Evaluation    Patient Name: Corey [name]  Date of Birth: [birth year]  Height: Height: 170.2 cm (5' 7)  Weight: Weight: 70.4 kg (155 lb 4.8 oz)  Room/Bed: 372/A  Payor: [employer] / Plan: [employer] PPO / Product Type: PPO /       PMH:  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    [place]  Dr Skipper    Hypercholesterolemia     Hypertension     IBS (irritable bowel syndrome)     Left foot pain 12/21/2021    Leukocytosis     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 THE PT IS VERY CONFUSED  AND WAS  A LITTLE IMPULSIVE. HIS SPOUSE INDICATES THAT THIS IS NOT NORMAL FOR HIM. HE HAD NORMAL MOBILITY PRIOR TO [facility]. THE PT NEEDED + 1 MOD ASSISTANCE FOR SAFETY WITH AMBULATION AND TRANSITIONS.    Total Distance Ambulated:    Independence:    Assistive Device:        Discharge Needs:    Equipment Recommendation: (P) raised toilet seat, shower chair, front wheeled walker      The  patient presents with mobility limitations due to impaired balance, impaired range of motion, impaired strength, and impaired functional activity tolerance that significantly impair/prevent patient's ability to participate in mobility-related activities of daily living (MRADLs) including  ambulation and transfers in order to safely complete, toileting. This functional mobility deficit can be sufficiently resolved with the use of a (P) raised toilet seat, shower chair, front wheeled walker  in order to decrease the risk of falls, morbidity, and mortality in performance of these MRADLs.  Patient is able to safely use this assistive device.    Discharge Disposition: TBD     JUSTIFICATION OF DISCHARGE RECOMMENDATION   Based on current diagnosis, functional performance prior to admission, and current functional performance, this patient requires continued PT services in   in order to achieve significant functional improvements in these deficit areas: (P) gait, locomotion, and balance, joint integrity and mobility.        Plan:   Current Intervention: (P) gait training,  bed mobility training, home exercise program, transfer training  To provide physical therapy services (P) 1x/day, 2x/day, minimum of 1x/week (M-F)  for duration of (P) until discharge.    The risks/benefits of therapy have been discussed with the patient/caregiver and he/she is in agreement with the established plan of care.       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    Narcolepsy     Neck pain 12/21/2021    Nephritis 12/21/2021    OSA (obstructive sleep apnea)     Osteoarthritis     Paresthesia 12/21/2021    Previous back surgery 12/21/2021    Right carotid bruit     Right hip pain 12/21/2021    Thigh pain 12/21/2021    Thyroid nodule     Weight loss, non-intentional 12/21/2021            Past Surgical History:   Procedure Laterality Date    CARDIAC CATHETERIZATION  07/13/2021    [id number]    Dr Garnette Ward    DENTAL SURGERY      HX BACK SURGERY      HX COLONOSCOPY  2019    had done at [facility]    HX CORONARY ARTERY BYPASS GRAFT  08/03/2021    Dr Skipper [facility]    HX HERNIA REPAIR      HX LAP CHOLECYSTECTOMY      ORTHOPEDIC SURGERY      PROSTATE SURGERY  10/2022    Dr. Emilio                 12/14/23 1300   Rehab Session   Document Type evaluation   PT Visit Date 12/14/23   General Information   Patient Profile Reviewed yes   Pertinent History of Current Functional Problem fall at home , THA 7/3   Medical Lines Telemetry   Respiratory Status room air   Existing Precautions/Restrictions fall precautions   Mutuality/Individual Preferences   Patient would like to participate in bedside shift report Yes   Living Environment   Lives With spouse   Home Assessment: No Problems Identified   Home Accessibility no concerns   Functional Level Prior   Ambulation 0 - independent   Transferring 0 - independent   Toileting 0 - independent   Bathing 0 - independent   Prior Functional Level Comment NO AMS PER SPOUSE   Pre Treatment Status   Pre Treatment Patient Status Patient supine in bed;Nurse approved session   Support Present Pre Treatment  Family present   Communication Pre Treatment  Charge Nurse   Cognitive Assessment/Interventions   Behavior/Mood  Observations confused   Orientation Status place;situation   Attention mild impairment   Follows Commands follows two step commands    Pre-Treatment Pain   Pretreatment Pain Rating 4/10   RUE Assessment   RUE Assessment WFL for stated baseline   LUE Assessment   LUE Assessment WFL for stated baseline   RLE Assessment   RLE Assessment WFL for stated baseline   LLE Assessment   LLE Assessment X-Exceptions   LLE ROM Hip   LLE Strength 2/5 MMT   LLE Other GUARDED OF LLE   Trunk Assessment   Trunk Assessment WFL for stated baseline   Mobility Assessment/Training   Comment THE PT HAD A STEP TO GAIT WITH FWW, HEAVY UE LEAN AND NEEDED CUEING TO KEEP COG IN WALKER AND TO MAKE SAFE TRANSITIONS.   Therapeutic Exercise   Comment ANKLE PUMPS, QUAD SETS, STANDING HEEL RAISES , AMBULATION IN HALL   Post Treatment Status   Post Treatment Patient Status Patient sitting in bedside chair or w/c;Patient safety alarm activated;Call light within reach   Support Present Post Treatment  Family present   Communication Biomedical engineer   Communication Post Treatment Comment CONFUSION   Patient Effort good   Post-Treatment Vital Signs   O2 Delivery Post Treatment room air   Physical Therapy Clinical Impression   Assessment THE PT EXHIBITED FAIR MOBILITY  WITH TRANSFERS AND GAIT  BUT FATIGUED QUICKLY AND WAS LIMITED BY PAIN. THE PT IS VERY CONFUSED  AND WAS  A LITTLE IMPULSIVE. HIS SPOUSE INDICATES THAT THIS IS NOT NORMAL FOR HIM. HE HAD NORMAL MOBILITY PRIOR TO [facility]. THE PT NEEDED + 1 MOD ASSISTANCE FOR SAFETY WITH AMBULATION AND TRANSITIONS.   Criteria for Skilled Therapeutic yes   Impairments Found (describe specific impairments) gait, locomotion, and balance;joint integrity and mobility   Functional Limitations in Following  self-care;other (see comments)  (AMBULATION, TRANSFERS)   Rehab Potential fair   Therapy Frequency 1x/day;2x/day;minimum of 1x/week  (M-F)   Predicted Duration of Therapy Intervention (days/wks) until discharge   Anticipated Equipment Needs at Discharge (PT) raised toilet seat;shower chair;front wheeled walker   Evaluation Complexity  Justification   Patient History: Co-morbidity/factors that impact Plan of Care Fracture: cause pain &/or impaired function;Surgical procedure: causing pain &/or impaired function   Examination Components 4 or more Exam elements addressed   Presentation Stable: Uncomplicated, straight-forward, problem focused   Clinical Decision Making Low complexity   Evaluation Complexity Low complexity   Care Plan Goals   PT Rehab Goals Bed Mobility Goal;Gait Training Goal;Transfer Training Goal   Planned Therapy Interventions, PT Eval   Planned Therapy Interventions (PT) gait training;bed mobility training;home exercise program;transfer training   Transfer Training Goal   Transfer Training Goal, Date Established 12/14/23   Transfer Training Goal, Time to Achieve by discharge   Transfer Training Goal, Activity Type all transfers   Transfer Training Goal, Current Status minimum assist (75% patient effort)   Transfer Training Goal, Independence Level modified independence   Transfer Training Goal, Assist Device walker, rolling   Gait Training  Goal, Distance to Achieve   Gait Training  Goal, Date Established 12/14/23   Gait Training  Goal, Time to Achieve by discharge   Gait Training  Goal, Current Status moderate assist (50% patient effort)   Gait Training  Goal, Independence Level modified independence   Gait Training  Goal, Assist Device walker, rolling   Gait Training  Goal, Distance to Achieve 250   Gait Training  Goal, Additional  Goal without LOB, gait failure  and pt exhibiting safety awareness using AD   Bed Mobility Goal   Bed Mobility Goal, Date Established 12/14/23   Bed Mobility Goal, Time to Achieve by discharge   Bed Mobility Goal, Activity Type all bed mobility activities   Bed Mobility Goal, Current Status moderate assist (50% patient effort)   Bed Mobility Goal, Independence Level modified independence   Bed Mobility Goal, Assistive Device bed rails   Physical Therapy Time and Intention   Total PT Minutes: 32    (INSERT FLOWSHEET)            INTERVENTION MINUTES: EVALUATION 15 minutes and GAIT TRAINING 20 MINUTES    EVALUATION COMPLEXITY : CLINICAL DECISION MAKING OF LOW COMPLEXITY AS INDICATED BY PMH, PHYSICAL THERAPY ASSESSMENT OF MUSCULOSKELETAL AND NEUROLOGICAL SYSTEMS AND ACTIVITY LIMITATIONS. CLINICAL PRESENTATION IS STABLE AND UNCOMPLICATED    Therapist:     Shawnee Moore, PT  12/14/2023, [phone]

## 2023-12-14 NOTE — Progress Notes (Signed)
 [facility]  Progress Note    Corey [name]  Date of service: 12/14/2023  Date of Admission:  12/12/2023  Hospital Day:  LOS: 2 days     Subjective:  Patient is seen in follow-up on 12/14/2023:  He is sitting up in the bed, wife is at bedside.  Staff reports he has been somewhat confused, climbing in the bed some.  States that he was having some left hip pain.  Pain medication is ordered.  Expresses interest in going to [facility].  Wife states she believes it would be best as well.    Review of Systems:    All systems were reviewed, pertinent positive and negative as mentioned in subjective.    Objective:      Vital Signs:  Vitals:    12/14/23 0438 12/14/23 0740 12/14/23 1008 12/14/23 1109   BP: 136/67 138/76  139/65   Pulse: 80 92 82 71   Resp: 17 19  17    Temp: 36.8 C (98.3 F) 37.1 C (98.7 F)  36.6 C (97.9 F)   SpO2: 95% 97%  94%   Weight:       Height:       BMI:                I/O:  I/O last 24 hours:    Intake/Output Summary (Last 24 hours) at 12/14/2023 1223  Last data filed at 12/14/2023 0310  Gross per 24 hour   Intake 1616 ml   Output 15 ml   Net 1601 ml           Physical Exam:    Constitutional:  Patient is lying in the bed, not in any distress appears comfortable.  HENT: Mucus membranes moist.  No oral ulcer.  Head: Normocephalic and atraumatic.   Mouth/Throat: Oropharynx is clear and moist.   Cardiovascular:  S1-S2 audible,   Pulmonary/Chest:  Bilaterally air entry  Abdominal: Soft. Bowel sounds are normal. Patient exhibits no distension. There is no abdominal tenderness. There is no rebound and no guarding.   Musculoskeletal: Normal range of motion.      Neurological:  Patient is awake, alert, oriented X 3, cranial nerves intact, moving all extremities, nonfocal examination  Skin: Skin is warm and dry. No erythema.   Psychiatric: Affect and judgment normal.     acetaminophen  (TYLENOL ) tablet, 650 mg, Oral, Q4H PRN  alcohol  62 % (NOZIN NASAL SANITIZER) nasal swab packet, 1  Each, Each Nostril, 2x/day  aspirin  (ECOTRIN) enteric coated tablet 81 mg, 81 mg, Oral, Daily  atorvastatin  (LIPITOR) tablet, 40 mg, Oral, QPM  benztropine  (COGENTIN ) tablet, 1 mg, Oral, NIGHTLY  bisacodyl  (DULCOLAX) rectal suppository, 10 mg, Rectal, Daily PRN  enoxaparin  PF (LOVENOX ) 40 mg/0.4 mL SubQ injection, 40 mg, Subcutaneous, Q24H  ezetimibe  (ZETIA ) tablet, 10 mg, Oral, QPM  famotidine  (PEPCID ) tablet, 40 mg, Oral, 2x/day  HYDROcodone -acetaminophen  (NORCO) 7.5 mg-325 mg per tablet, 1 Tablet, Oral, Q4H PRN  losartan  (COZAAR ) tablet, 50 mg, Oral, 2x/day  LR premix infusion, , Intravenous, Continuous  metoprolol  succinate (TOPROL -XL) 24 hr extended release tablet, 12.5 mg, Oral, Daily  nicotine  (NICODERM CQ ) transdermal patch (mg/24 hr), 21 mg, Transdermal, Q24H  ondansetron  (ZOFRAN ) 2 mg/mL injection, 4 mg, Intravenous, Q6H PRN  pantoprazole  (PROTONIX ) delayed release tablet, 40 mg, Oral, NIGHTLY  sennosides-docusate sodium  (SENOKOT-S) 8.6-50mg  per tablet, 1 Tablet, Oral, 2x/day PRN  sertraline  (ZOLOFT ) tablet, 100 mg, Oral, NIGHTLY  tamsulosin  (FLOMAX ) capsule, 0.4 mg, Oral, NIGHTLY  Labs:  Results for orders placed or performed during the hospital encounter of 12/12/23 (from the past 24 hours)   BASIC METABOLIC PANEL    Collection Time: 12/14/23  2:56 AM   Result Value Ref Range    SODIUM 138 136 - 145 mmol/L    POTASSIUM 4.0 3.5 - 5.1 mmol/L    CHLORIDE 106 98 - 107 mmol/L    CO2 TOTAL 24 21 - 31 mmol/L    ANION GAP 8 4 - 13 mmol/L    CALCIUM 8.5 (L) 8.6 - 10.3 mg/dL    GLUCOSE 853 (H) 74 - 109 mg/dL    BUN 16 7 - 25 mg/dL    CREATININE 9.04 9.39 - 1.30 mg/dL    BUN/CREA RATIO 17 6 - 22    ESTIMATED GFR 83 >59 mL/min/1.88m^2    OSMOLALITY, CALCULATED 280 270 - 290 mOsm/kg    Narrative    Estimated Glomerular Filtration Rate (eGFR) is calculated using the CKD-EPI (2021) equation, intended for patients 40 years of age and older. If gender is not documented or unknown, there will be no eGFR  calculation.     MAGNESIUM  - AM ONCE    Collection Time: 12/14/23  2:56 AM   Result Value Ref Range    MAGNESIUM  1.9 1.9 - 2.7 mg/dL   CBC/DIFF    Collection Time: 12/14/23  2:56 AM    Narrative    The following orders were created for panel order CBC/DIFF.  Procedure                               Abnormality         Status                     ---------                               -----------         ------                     CBC WITH IPQQ[[id number]]                Abnormal            Final result                 Please view results for these tests on the individual orders.   CBC WITH DIFF    Collection Time: 12/14/23  2:56 AM   Result Value Ref Range    WBC 10.3 (H) 3.6 - 10.2 x10^3/uL    RBC 3.89 (L) 4.06 - 5.63 x10^6/uL    HGB 12.4 (L) 12.5 - 16.3 g/dL    HCT 63.9 (L) 63.2 - 47.1 %    MCV 92.6 73.0 - 96.2 fL    MCH 31.8 23.8 - 33.4 pg    MCHC 34.4 32.5 - 36.3 g/dL    RDW 86.2 87.8 - 83.7 %    PLATELETS 161 140 - 440 x10^3/uL    MPV 8.9 7.4 - 11.4 fL    NEUTROPHIL % 91 (H) 44 - 74 %    LYMPHOCYTE % 6 (L) 15 - 43 %    MONOCYTE % 3 (L) 6 - 14 %    EOSINOPHIL % 0 (L) 1 - 8 %    BASOPHIL % 0 0 -  1 %    NEUTROPHIL # 9.30 (H) 1.85 - 7.84 x10^3/uL    LYMPHOCYTE # 0.60 (L) 1.00 - 3.00 x10^3/uL    MONOCYTE # 0.30 0.30 - 1.10 x10^3/uL    EOSINOPHIL # 0.00 0.00 - 0.60 x10^3/uL    BASOPHIL # 0.00 0.00 - 0.10 x10^3/uL        Imaging:    Results for orders placed or performed during the hospital encounter of 12/12/23 (from the past 24 hours)   XR PELVIS     Status: None    Narrative    Shivaay HARLEY Riede    RADIOLOGISTBETHA Lynwood Shoemaker    XR PELVIS performed on 12/13/2023 8:57 PM    CLINICAL HISTORY: Postop.  Post-op    TECHNIQUE: 1 view(s) of the pelvis.    COMPARISON: 12/12/2023    FINDINGS:    No fracture.  No suspicious bone lesion.  Left hip arthroplasty in good alignment on this single view.   Air in the soft tissues along the left hip from the recent surgery.         Impression    Left hip arthroplasty in good alignment  on this single view              Radiologist location ID: [id number]         Microbiology:  No results found for any visits on 12/12/23 (from the past 96 hours).        Assessment/ Plan:   Active Hospital Problems    Diagnosis    Primary Problem: Hip fracture    Closed left hip fracture    Hyperlipidemia    GERD (gastroesophageal reflux disease)      #Left femur fracture  -orthopedic surgery consultation, patient had left hip hemiarthroplasty yesterday  -optimal pain control, morphine  IV  -PT, OT consultation     # CAD  -history of CABG 3 years ago  -revised cardiac risk index score 6%     # hypertension  # hyperlipidemia  -resume antihypertensive medication losartan , metoprolol   -resume atorvastatin  40 mg daily     # tobacco abuse  -smoking cessation counseling  -nicotine  patch p.r.n.    # depression  -resume Zoloft         Diet:  Regular  Fluids:  LR at 30 mL an hour   DVT/PE Prophylaxis:  Lovenox    Ulcer Prophylaxis:  Pantoprazole    Bowel Regimen: Miralax   Consults:  Orthopedic surgery  Hardware (Lines, Drains, Foley, Tubes): PIV   Activity:  As per Orthopedic recommendations   Therapy: PT/OT        Disposition Planning:  Patient interested in rehab, PT, OT, case management consulted    Powell Corona, FNP-BC      This note was partially generated using MModal Fluency Direct system, and there may be some incorrect words, spellings, and punctuation that were not noted in checking the note before saving.

## 2023-12-15 DIAGNOSIS — S0990XA Unspecified injury of head, initial encounter: Secondary | ICD-10-CM

## 2023-12-15 LAB — BASIC METABOLIC PANEL
ANION GAP: 11 mmol/L (ref 4–13)
BUN/CREA RATIO: 25 — ABNORMAL HIGH (ref 6–22)
BUN: 16 mg/dL (ref 7–25)
CALCIUM: 8.8 mg/dL (ref 8.6–10.3)
CHLORIDE: 105 mmol/L (ref 98–107)
CO2 TOTAL: 23 mmol/L (ref 21–31)
CREATININE: 0.65 mg/dL (ref 0.60–1.30)
ESTIMATED GFR: 98 mL/min/1.73mˆ2 (ref >59)
GLUCOSE: 103 mg/dL (ref 74–109)
OSMOLALITY, CALCULATED: 279 mosm/kg (ref 270–290)
POTASSIUM: 3.4 mmol/L — ABNORMAL LOW (ref 3.5–5.1)
SODIUM: 139 mmol/L (ref 136–145)

## 2023-12-15 LAB — CBC
HCT: 38.2 % (ref 36.7–47.1)
HGB: 13.4 g/dL (ref 12.5–16.3)
MCH: 31.9 pg (ref 23.8–33.4)
MCHC: 34.9 g/dL (ref 32.5–36.3)
MCV: 91.2 fL (ref 73.0–96.2)
MPV: 9 fL (ref 7.4–11.4)
PLATELETS: 179 x10ˆ3/uL (ref 140–440)
RBC: 4.19 x10ˆ6/uL (ref 4.06–5.63)
RDW: 13.6 % (ref 12.1–16.2)
WBC: 12.5 x10ˆ3/uL — ABNORMAL HIGH (ref 3.6–10.2)

## 2023-12-15 LAB — MAGNESIUM: MAGNESIUM: 1.8 mg/dL — ABNORMAL LOW (ref 1.9–2.7)

## 2023-12-15 MED ORDER — RISPERIDONE 0.5 MG TABLET
0.5000 mg | ORAL_TABLET | Freq: Two times a day (BID) | ORAL | Status: DC
Start: 2023-12-15 — End: 2023-12-18
  Administered 2023-12-15 – 2023-12-18 (×7): 0.5 mg via ORAL
  Filled 2023-12-15 (×7): qty 1

## 2023-12-15 MED ORDER — QUETIAPINE 25 MG TABLET
25.0000 mg | ORAL_TABLET | ORAL | Status: AC
Start: 2023-12-15 — End: 2023-12-15
  Administered 2023-12-15: 25 mg via ORAL
  Filled 2023-12-15: qty 1

## 2023-12-15 MED ORDER — RAMELTEON 8 MG TABLET
8.0000 mg | ORAL_TABLET | Freq: Every evening | ORAL | Status: DC
Start: 2023-12-15 — End: 2023-12-18
  Administered 2023-12-15 – 2023-12-17 (×3): 8 mg via ORAL
  Filled 2023-12-15 (×3): qty 1

## 2023-12-15 MED ORDER — POTASSIUM BICARBONATE-CITRIC ACID 25 MEQ EFFERVESCENT TABLET
25.0000 meq | EFFERVESCENT_TABLET | ORAL | Status: AC
Start: 2023-12-15 — End: 2023-12-15
  Administered 2023-12-15: 25 meq via ORAL
  Filled 2023-12-15: qty 1

## 2023-12-15 NOTE — Care Management Notes (Signed)
 CM received secure chat requesting [facility] referral. Referral sent via [facility].

## 2023-12-15 NOTE — PT Treatment (Signed)
 [facility] Medicine [facility]  [address]  [place], [address]  [phone]  (Fax) [phone]  Rehabilitation Department  Physical Therapy Daily Inpatient Note    Date: 12/15/2023  Patient's Name: Corey [name]  Date of Birth: [birth year]  Height: Height: 170.2 cm (5' 7)  Weight: Weight: 70.4 kg (155 lb 4.8 oz)      Plan: Will continue under current POC.      Discharge Disposition: (P) skilled nursing facility, inpatient rehabilitation facility        Subjective/Objective/Assessment:  Flowsheet    12/15/23 0805   Rehab Session   Document Type therapy progress note (daily note)   PT Visit Date 12/15/23   General Information   Patient Profile Reviewed yes   Medical Lines Telemetry   Respiratory Status room air   Existing Precautions/Restrictions fall precautions   Pre Treatment Status   Pre Treatment Patient Status Patient supine in bed   Communication Pre Treatment  Charge Nurse   Communication Pre Treatment Comment cleared   Pre-Treatment Pain   Pretreatment Pain Rating 8/10   Pre/Posttreatment Pain Comment right hip   Bed Mobility   Comment supine to sit mod of 1   Transfer Assessment/Treatment   Sit-Stand Independence moderate assist (50% patient effort)   Stand-Sit Independence moderate assist (50% patient effort)   Sit-Stand-Sit, Assist Device walker, front wheeled   Transfer Impairments balance impaired;cognition impaired;endurance;pain;strength decreased   Gait Assessment/Treatment   Total Distance Ambulated 10   Independence  moderate assist (50% patient effort);minimum assist (75% patient effort)   Assistive Device  walker, front wheeled   Impairments  cognition impaired;balance impaired;endurance;pain;strength decreased   Comment unsafe walking, no safety awareness, did not recall any hip precautions.SABRA lets go of the waker   Balance   Sitting Balance: Static fair + balance   Sit-to-Stand Balance fair balance   Standing Balance: Static fair - balance   Standing Balance:  Dynamic poor balance   Therapeutic Exercise   Comment reviewed anklepump,quad sets, glut sets, abd/add and aa with slr's 10 reps   Post Treatment Status   Post Treatment Patient Status Patient sitting in bedside chair or w/c;Patient safety alarm activated   Patient Effort good   Cognitive Assessment/Intervention   Behavior/Mood Observations alert;confused   Physical Therapy Time and Intention   Total PT Minutes: 24   Therapy Plan Review/Discharge Plan (PT)   Anticipated Discharge Disposition skilled nursing facility;inpatient rehabilitation facility     Patient is in the bed, transferred supine to sit with mod of 1, standing with min to mdof 1, gaited with rw 33ft with min to mod of 1, no safety awareness, did not recall or follow any hip precautions, left in the chair with needsin reach, chair alarmed... reviewe ex program as well,           Goals:     all bed mobility activities  modified independence  bed rails    modified independence  walker, rolling  250  without LOB, gait failure  and pt exhibiting safety awareness using AD         all transfers  modified independence  walker, rolling                     Intervention minutes: THERAPEUTIC EXERCISE 11 minutes and GAIT TRAINING [address] MINUTES    THERAPIST  Orie Molt, PTA  12/15/2023, 09:33

## 2023-12-15 NOTE — Care Plan (Signed)
 Problem: Adult Inpatient Plan of Care  Goal: Plan of Care Review  Outcome: Ongoing (see interventions/notes)  Goal: Patient-Specific Goal (Individualized)  Outcome: Ongoing (see interventions/notes)  Flowsheets (Taken 12/15/2023 2000)  Individualized Care Needs: assist with ADL's, monitor vital signs, prn medicaitons given as needed, monitor surgical dressing for bleeding and drainage  Anxieties, Fears or Concerns: pain, mobility  Goal: Absence of Hospital-Acquired Illness or Injury  Outcome: Ongoing (see interventions/notes)  Intervention: Identify and Manage Fall Risk  Recent Flowsheet Documentation  Taken 12/15/2023 2000 by Rosina NOVAK, RN  Safety Promotion/Fall Prevention:   activity supervised   fall prevention program maintained   nonskid shoes/slippers when out of bed   safety round/check completed  Intervention: Prevent Skin Injury  Recent Flowsheet Documentation  Taken 12/15/2023 2000 by Rosina NOVAK, RN  Body Position: supine, head elevated  Intervention: Prevent and Manage VTE (Venous Thromboembolism) Risk  Recent Flowsheet Documentation  Taken 12/15/2023 2000 by Rosina NOVAK, RN  VTE Prevention/Management: ambulation promoted  Goal: Optimal Comfort and Wellbeing  Outcome: Ongoing (see interventions/notes)  Intervention: Provide Person-Centered Care  Recent Flowsheet Documentation  Taken 12/15/2023 2000 by Rosina NOVAK, RN  Trust Relationship/Rapport:   care explained   choices provided   emotional support provided  Goal: Rounds/Family Conference  Outcome: Ongoing (see interventions/notes)     Problem: Orthopaedic Fracture  Goal: Absence of Bleeding  Outcome: Ongoing (see interventions/notes)  Goal: Bowel Elimination  Outcome: Ongoing (see interventions/notes)  Goal: Absence of Embolism Signs and Symptoms  Outcome: Ongoing (see interventions/notes)  Intervention: Prevent or Manage Embolism Risk  Recent Flowsheet Documentation  Taken 12/15/2023 2000 by Rosina NOVAK, RN  VTE Prevention/Management: ambulation promoted  Goal: Fracture  Stability  Outcome: Ongoing (see interventions/notes)  Goal: Optimal Functional Ability  Outcome: Ongoing (see interventions/notes)  Intervention: Optimize Functional Ability  Recent Flowsheet Documentation  Taken 12/15/2023 2000 by Rosina NOVAK, RN  Activity Management: ambulated in room  Goal: Absence of Infection Signs and Symptoms  Outcome: Ongoing (see interventions/notes)  Intervention: Prevent or Manage Infection  Recent Flowsheet Documentation  Taken 12/15/2023 2000 by Rosina NOVAK, RN  Fever Reduction/Comfort Measures:   lightweight bedding   lightweight clothing  Goal: Effective Tissue Perfusion  Outcome: Ongoing (see interventions/notes)  Goal: Optimal Pain Control and Function  Outcome: Ongoing (see interventions/notes)  Goal: Effective Oxygenation and Ventilation  Outcome: Ongoing (see interventions/notes)  Intervention: Promote Airway Secretion Clearance  Recent Flowsheet Documentation  Taken 12/15/2023 2000 by Rosina NOVAK, RN  Activity Management: ambulated in room  Intervention: Optimize Oxygenation and Ventilation  Recent Flowsheet Documentation  Taken 12/15/2023 2000 by Rosina NOVAK, RN  Head of Bed [facility]) Positioning: HOB at 30-45 degrees     Problem: Fall Injury Risk  Goal: Absence of Fall and Fall-Related Injury  Outcome: Ongoing (see interventions/notes)  Intervention: Promote Injury-Free Environment  Recent Flowsheet Documentation  Taken 12/15/2023 2000 by Rosina NOVAK, RN  Safety Promotion/Fall Prevention:   activity supervised   fall prevention program maintained   nonskid shoes/slippers when out of bed   safety round/check completed     Problem: Skin Injury Risk Increased  Goal: Skin Health and Integrity  Outcome: Ongoing (see interventions/notes)  Intervention: Optimize Skin Protection  Recent Flowsheet Documentation  Taken 12/15/2023 2000 by Rosina NOVAK, RN  Activity Management: ambulated in room  Head of Bed [facility]) Positioning: HOB at 30-45 degrees     Problem: Wound  Goal: Optimal Coping  Outcome: Ongoing (see  interventions/notes)  Goal: Optimal Functional Ability  Outcome: Ongoing (see interventions/notes)  Intervention: Optimize Functional Ability  Recent Flowsheet Documentation  Taken 12/15/2023 2000 by Rosina NOVAK, RN  Activity Management: ambulated in room  Activity Assistance Provided: assistance, 2 people  Assistive Device Utilized:   gait belt   walker  Goal: Absence of Infection Signs and Symptoms  Outcome: Ongoing (see interventions/notes)  Intervention: Prevent or Manage Infection  Recent Flowsheet Documentation  Taken 12/15/2023 2000 by Rosina NOVAK, RN  Fever Reduction/Comfort Measures:   lightweight bedding   lightweight clothing  Goal: Improved Oral Intake  Outcome: Ongoing (see interventions/notes)  Goal: Optimal Pain Control and Function  Outcome: Ongoing (see interventions/notes)  Goal: Skin Health and Integrity  Outcome: Ongoing (see interventions/notes)  Intervention: Optimize Skin Protection  Recent Flowsheet Documentation  Taken 12/15/2023 2000 by Rosina NOVAK, RN  Activity Management: ambulated in room  Head of Bed [facility]) Positioning: HOB at 30-45 degrees  Goal: Optimal Wound Healing  Outcome: Ongoing (see interventions/notes)  Intervention: Promote Wound Healing  Recent Flowsheet Documentation  Taken 12/15/2023 2000 by Rosina NOVAK, RN  Activity Management: ambulated in room

## 2023-12-15 NOTE — Progress Notes (Signed)
 [facility]  Progress Note    Corey [name]  Date of service: 12/15/2023  Date of Admission:  12/12/2023  Hospital Day:  LOS: 3 days     Subjective:  Patient is seen in follow-up on 12/15/2023:  He has been sleeping sitting up in the chair intermittently this morning.  Wife states that he was up and confused quite a bit through the night.  [facility], referral has been sent.    Review of Systems:    All systems were reviewed, pertinent positive and negative as mentioned in subjective.    Objective:      Vital Signs:  Vitals:    12/15/23 0451 12/15/23 0731 12/15/23 0958 12/15/23 1106   BP: (!) 170/67 (!) 152/63  (!) 122/57   Pulse:  98 97 91   Resp:  19  17   Temp:  36.7 C (98 F)  36.7 C (98 F)   SpO2:  94%  95%   Weight:       Height:       BMI:                I/O:  I/O last 24 hours:    Intake/Output Summary (Last 24 hours) at 12/15/2023 1139  Last data filed at 12/14/2023 1728  Gross per 24 hour   Intake 176 ml   Output 575 ml   Net -399 ml           Physical Exam:    Constitutional:  Patient is sitting up in the chair, not in any distress appears comfortable.  HENT: Mucus membranes moist.  No oral ulcer.  Head: Normocephalic and atraumatic.   Mouth/Throat: Oropharynx is clear and moist.   Cardiovascular:  S1-S2 audible,   Pulmonary/Chest:  Bilaterally air entry  Abdominal: Soft. Bowel sounds are normal. Patient exhibits no distension. There is no abdominal tenderness. There is no rebound and no guarding.   Musculoskeletal: Normal range of motion.    .   Neurological:  Patient is awake, alert, oriented to person and place, cranial nerves intact, moving all extremities, nonfocal examination  Skin: Skin is warm and dry. No erythema.   Psychiatric: Affect and judgment normal.     acetaminophen  (TYLENOL ) tablet, 650 mg, Oral, Q4H PRN  alcohol  62 % (NOZIN NASAL SANITIZER) nasal swab packet, 1 Each, Each Nostril, 2x/day  aspirin  chewable tablet 81 mg, 81 mg, Oral,  Daily  atorvastatin  (LIPITOR) tablet, 40 mg, Oral, QPM  [Held by provider] benztropine  (COGENTIN ) tablet, 1 mg, Oral, NIGHTLY  bisacodyl  (DULCOLAX) rectal suppository, 10 mg, Rectal, Daily PRN  enoxaparin  PF (LOVENOX ) 40 mg/0.4 mL SubQ injection, 40 mg, Subcutaneous, Q24H  ezetimibe  (ZETIA ) tablet, 10 mg, Oral, QPM  famotidine  (PEPCID ) tablet, 40 mg, Oral, 2x/day  HYDROcodone -acetaminophen  (NORCO) 7.5 mg-325 mg per tablet, 1 Tablet, Oral, Q4H PRN  losartan  (COZAAR ) tablet, 50 mg, Oral, 2x/day  LR premix infusion, , Intravenous, Continuous  metoprolol  succinate (TOPROL -XL) 24 hr extended release tablet, 12.5 mg, Oral, Daily  [Held by provider] nicotine  (NICODERM CQ ) transdermal patch (mg/24 hr), 21 mg, Transdermal, Q24H  ondansetron  (ZOFRAN ) 2 mg/mL injection, 4 mg, Intravenous, Q6H PRN  pantoprazole  (PROTONIX ) delayed release tablet, 40 mg, Oral, NIGHTLY  polyethylene glycol (MIRALAX ) oral packet, 17 g, Oral, Daily  ramelteon  (ROZEREM ) tablet, 8 mg, Oral, NIGHTLY  risperiDONE  (risperDAL ) tablet, 0.5 mg, Oral, 2x/day  sennosides-docusate sodium  (SENOKOT-S) 8.6-50mg  per tablet, 1 Tablet, Oral, 2x/day PRN  sertraline  (ZOLOFT ) tablet, 100  mg, Oral, NIGHTLY  tamsulosin  (FLOMAX ) capsule, 0.4 mg, Oral, NIGHTLY      Labs:  Results for orders placed or performed during the hospital encounter of 12/12/23 (from the past 24 hours)   CBC - AM ONCE    Collection Time: 12/15/23  4:36 AM   Result Value Ref Range    WBC 12.5 (H) 3.6 - 10.2 x10^3/uL    RBC 4.19 4.06 - 5.63 x10^6/uL    HGB 13.4 12.5 - 16.3 g/dL    HCT 61.7 63.2 - 52.8 %    MCV 91.2 73.0 - 96.2 fL    MCH 31.9 23.8 - 33.4 pg    MCHC 34.9 32.5 - 36.3 g/dL    RDW 86.3 87.8 - 83.7 %    PLATELETS 179 140 - 440 x10^3/uL    MPV 9.0 7.4 - 11.4 fL   BASIC METABOLIC PANEL    Collection Time: 12/15/23  4:36 AM   Result Value Ref Range    SODIUM 139 136 - 145 mmol/L    POTASSIUM 3.4 (L) 3.5 - 5.1 mmol/L    CHLORIDE 105 98 - 107 mmol/L    CO2 TOTAL 23 21 - 31 mmol/L    ANION GAP 11  4 - 13 mmol/L    CALCIUM 8.8 8.6 - 10.3 mg/dL    GLUCOSE 896 74 - 890 mg/dL    BUN 16 7 - 25 mg/dL    CREATININE 9.34 9.39 - 1.30 mg/dL    BUN/CREA RATIO 25 (H) 6 - 22    ESTIMATED GFR 98 >59 mL/min/1.32m^2    OSMOLALITY, CALCULATED 279 270 - 290 mOsm/kg    Narrative    Estimated Glomerular Filtration Rate (eGFR) is calculated using the CKD-EPI (2021) equation, intended for patients 63 years of age and older. If gender is not documented or unknown, there will be no eGFR calculation.     MAGNESIUM  - AM ONCE    Collection Time: 12/15/23  4:36 AM   Result Value Ref Range    MAGNESIUM  1.8 (L) 1.9 - 2.7 mg/dL        Imaging:         Microbiology:  No results found for any visits on 12/12/23 (from the past 96 hours).        Assessment/ Plan:   Active Hospital Problems    Diagnosis    Primary Problem: Hip fracture    Closed left hip fracture    Hyperlipidemia    GERD (gastroesophageal reflux disease)       #Left femur fracture  -orthopedic surgery consultation, patient had left hip hemiarthroplasty  -optimal pain control, hydrocodone  p.o.  -PT, OT consultation  -referral [facility] for [facility]     # CAD  -history of CABG 3 years ago  -revised cardiac risk index score 6%     # hypertension  # hyperlipidemia  -resume antihypertensive medication losartan , metoprolol   -resume atorvastatin  40 mg daily     # tobacco abuse  -smoking cessation counseling  -nicotine  patch p.r.n.     # depression  -resume Zoloft      Diet:  Regular   Fluids:  LR at 30 mL an hour   DVT/PE Prophylaxis:  Lovenox    Ulcer Prophylaxis:  Pantoprazole    Bowel Regimen: Miralax /Senokot   Consults:  Orthopedic surgery  Hardware (Lines, Drains, Foley, Tubes): PIV  Activity:  As tolerated, per ortho recommendations   Therapy: PT/OT        Disposition Planning:  Referral placed to  [facility]    Powell Corona, FNP-BC      This note was partially generated using MModal Fluency Direct system, and there may be some incorrect words, spellings, and punctuation  that were not noted in checking the note before saving.

## 2023-12-15 NOTE — Nurses Notes (Addendum)
 Late entries for the following:    0215: sent secure chat message to E.Claudene, NP notifying provider that patient is having increased confusion, currently seeing horses, birds, and cats, patient does not believe that he is at the hospital, patient believes that he is at home, patient has made multiple attempts to get out of bed, patient believes that it is daytime despite night shift nurse showing him window and that it is night time, please see new order for seroquel .    0246: sent secure chat message to E. Claudene, NP notifying provider that while giving patient his seroquel  patient wanted to get up  and put clothes on, nursing staff assisted patient with putting on shirt and shorts, while standing patient one tooth was found in the bed, patient stated it was probably his crown. Tooth was placed in specimen cup with patient label and placed in his personal bag. No bleeding noted to mouth.

## 2023-12-16 LAB — BASIC METABOLIC PANEL
ANION GAP: 11 mmol/L (ref 4–13)
BUN/CREA RATIO: 21 (ref 6–22)
BUN: 19 mg/dL (ref 7–25)
CALCIUM: 8.7 mg/dL (ref 8.6–10.3)
CHLORIDE: 104 mmol/L (ref 98–107)
CO2 TOTAL: 22 mmol/L (ref 21–31)
CREATININE: 0.9 mg/dL (ref 0.60–1.30)
ESTIMATED GFR: 89 mL/min/1.73mˆ2 (ref >59)
GLUCOSE: 104 mg/dL (ref 74–109)
OSMOLALITY, CALCULATED: 276 mosm/kg (ref 270–290)
POTASSIUM: 3.4 mmol/L — ABNORMAL LOW (ref 3.5–5.1)
SODIUM: 137 mmol/L (ref 136–145)

## 2023-12-16 LAB — CBC
HCT: 37.6 % (ref 36.7–47.1)
HGB: 13 g/dL (ref 12.5–16.3)
MCH: 32.1 pg (ref 23.8–33.4)
MCHC: 34.6 g/dL (ref 32.5–36.3)
MCV: 92.7 fL (ref 73.0–96.2)
MPV: 9.3 fL (ref 7.4–11.4)
PLATELETS: 173 x10ˆ3/uL (ref 140–440)
RBC: 4.06 x10ˆ6/uL (ref 4.06–5.63)
RDW: 14.1 % (ref 12.1–16.2)
WBC: 9.6 x10ˆ3/uL (ref 3.6–10.2)

## 2023-12-16 LAB — MAGNESIUM: MAGNESIUM: 1.8 mg/dL — ABNORMAL LOW (ref 1.9–2.7)

## 2023-12-16 MED ORDER — NICOTINE 21 MG/24 HR DAILY TRANSDERMAL PATCH
21.0000 mg | MEDICATED_PATCH | TRANSDERMAL | Status: DC
Start: 2023-12-16 — End: 2023-12-18
  Administered 2023-12-16 – 2023-12-18 (×3): 21 mg via TRANSDERMAL
  Filled 2023-12-16 (×3): qty 1

## 2023-12-16 MED ORDER — POTASSIUM CHLORIDE ER 20 MEQ TABLET,EXTENDED RELEASE(PART/CRYST)
40.0000 meq | ORAL_TABLET | ORAL | Status: AC
Start: 2023-12-16 — End: 2023-12-16
  Administered 2023-12-16: 40 meq via ORAL
  Filled 2023-12-16: qty 2

## 2023-12-16 NOTE — Respiratory Therapy (Signed)
 Smoking cessation was completed with the patient. Education was provided on the health risks of smoking and the benefits of quitting. Discussion of nicotine  replacement therapy options were discussed and he expresses the need of a Nicotine  patch, there is an order for a patch in MAR. Behavioral strategies were also discussed for coping mechanisms, trigger avoidance, and relapse. Patient expresses interest in quitting smoking. He states that he smokes half a pack a day for approximately 40 years. Tobacco Cessation Program pamphlet provided and discussed

## 2023-12-16 NOTE — Progress Notes (Signed)
 [facility]  Progress Note    Corey [name]  Date of service: 12/16/2023  Date of Admission:  12/12/2023  Hospital Day:  LOS: 4 days     Subjective:  Patient is seen in follow-up on 12/16/2023: He is much more alert this morning, answering questions appropriately.  Reports that his pain has been fairly well-controlled, does still have some pain with movement.  [facility]    Review of Systems:    All systems were reviewed, pertinent positive and negative as mentioned in subjective.    Objective:      Vital Signs:  Vitals:    12/16/23 0732 12/16/23 0814 12/16/23 0815 12/16/23 1101   BP: 109/60 109/60 109/60    Pulse: (!) 109  (!) 109 78   Resp: 17      Temp: 36.9 C (98.4 F)      SpO2: 93%      Weight:       Height:       BMI:                I/O:  I/O last 24 hours:  No intake or output data in the 24 hours ending 12/16/23 1142        Physical Exam:    Constitutional:  Patient is lying in the bed, not in any distress appears comfortable.  HENT: Mucus membranes moist.  No oral ulcer.  Head: Normocephalic and atraumatic.   Mouth/Throat: Oropharynx is clear and moist.   Cardiovascular:  S1-S2 audible,   Pulmonary/Chest:  Bilaterally air entry  Abdominal: Soft. Bowel sounds are normal. Patient exhibits no distension. There is no abdominal tenderness. There is no rebound and no guarding. .   Neurological:  Patient is awake, alert, oriented X 3, cranial nerves intact, moving all extremities, nonfocal examination  Skin: Skin is warm and dry. No erythema.   Psychiatric: Affect and judgment normal.     acetaminophen  (TYLENOL ) tablet, 650 mg, Oral, Q4H PRN  alcohol  62 % (NOZIN NASAL SANITIZER) nasal swab packet, 1 Each, Each Nostril, 2x/day  aspirin  chewable tablet 81 mg, 81 mg, Oral, Daily  atorvastatin  (LIPITOR) tablet, 40 mg, Oral, QPM  [Held by provider] benztropine  (COGENTIN ) tablet, 1 mg, Oral, NIGHTLY  bisacodyl  (DULCOLAX) rectal suppository, 10 mg, Rectal, Daily PRN  enoxaparin   PF (LOVENOX ) 40 mg/0.4 mL SubQ injection, 40 mg, Subcutaneous, Q24H  ezetimibe  (ZETIA ) tablet, 10 mg, Oral, QPM  famotidine  (PEPCID ) tablet, 40 mg, Oral, 2x/day  HYDROcodone -acetaminophen  (NORCO) 7.5 mg-325 mg per tablet, 1 Tablet, Oral, Q4H PRN  losartan  (COZAAR ) tablet, 50 mg, Oral, 2x/day  LR premix infusion, , Intravenous, Continuous  metoprolol  succinate (TOPROL -XL) 24 hr extended release tablet, 12.5 mg, Oral, Daily  [Held by provider] nicotine  (NICODERM CQ ) transdermal patch (mg/24 hr), 21 mg, Transdermal, Q24H  ondansetron  (ZOFRAN ) 2 mg/mL injection, 4 mg, Intravenous, Q6H PRN  pantoprazole  (PROTONIX ) delayed release tablet, 40 mg, Oral, NIGHTLY  polyethylene glycol (MIRALAX ) oral packet, 17 g, Oral, Daily  ramelteon  (ROZEREM ) tablet, 8 mg, Oral, NIGHTLY  risperiDONE  (risperDAL ) tablet, 0.5 mg, Oral, 2x/day  sennosides-docusate sodium  (SENOKOT-S) 8.6-50mg  per tablet, 1 Tablet, Oral, 2x/day PRN  sertraline  (ZOLOFT ) tablet, 100 mg, Oral, NIGHTLY  tamsulosin  (FLOMAX ) capsule, 0.4 mg, Oral, NIGHTLY      Labs:  Results for orders placed or performed during the hospital encounter of 12/12/23 (from the past 24 hours)   CBC - AM ONCE    Collection Time: 12/16/23  3:48 AM   Result  Value Ref Range    WBC 9.6 3.6 - 10.2 x10^3/uL    RBC 4.06 4.06 - 5.63 x10^6/uL    HGB 13.0 12.5 - 16.3 g/dL    HCT 62.3 63.2 - 52.8 %    MCV 92.7 73.0 - 96.2 fL    MCH 32.1 23.8 - 33.4 pg    MCHC 34.6 32.5 - 36.3 g/dL    RDW 85.8 87.8 - 83.7 %    PLATELETS 173 140 - 440 x10^3/uL    MPV 9.3 7.4 - 11.4 fL   BASIC METABOLIC PANEL    Collection Time: 12/16/23  3:48 AM   Result Value Ref Range    SODIUM 137 136 - 145 mmol/L    POTASSIUM 3.4 (L) 3.5 - 5.1 mmol/L    CHLORIDE 104 98 - 107 mmol/L    CO2 TOTAL 22 21 - 31 mmol/L    ANION GAP 11 4 - 13 mmol/L    CALCIUM 8.7 8.6 - 10.3 mg/dL    GLUCOSE 895 74 - 890 mg/dL    BUN 19 7 - 25 mg/dL    CREATININE 9.09 9.39 - 1.30 mg/dL    BUN/CREA RATIO 21 6 - 22    ESTIMATED GFR 89 >59 mL/min/1.53m^2     OSMOLALITY, CALCULATED 276 270 - 290 mOsm/kg    Narrative    Estimated Glomerular Filtration Rate (eGFR) is calculated using the CKD-EPI (2021) equation, intended for patients 40 years of age and older. If gender is not documented or unknown, there will be no eGFR calculation.     MAGNESIUM  - AM ONCE    Collection Time: 12/16/23  3:48 AM   Result Value Ref Range    MAGNESIUM  1.8 (L) 1.9 - 2.7 mg/dL        Imaging:         Microbiology:  No results found for any visits on 12/12/23 (from the past 96 hours).        Assessment/ Plan:   Active Hospital Problems    Diagnosis    Primary Problem: Hip fracture    Head injury due to trauma    Closed left hip fracture    Hyperlipidemia    GERD (gastroesophageal reflux disease)       #Left femur fracture  -orthopedic surgery consultation, patient had left hip hemiarthroplasty  -optimal pain control, hydrocodone  p.o.  -PT, OT consultation  -referral [facility] to [facility] for [facility]hab     # CAD  -history of CABG 3 years ago  -revised cardiac risk index score 6%     # hypertension  # hyperlipidemia  -resume antihypertensive medication losartan , metoprolol   -resume atorvastatin  40 mg daily     # tobacco abuse  -smoking cessation counseling  -nicotine  patch p.r.n.     # depression  -resume Zoloft      Diet:  Regular   Fluids:  LR at 30 mL an hour   DVT/PE Prophylaxis:  Lovenox    Ulcer Prophylaxis:  Pantoprazole    Bowel Regimen: Miralax /Senokot   Consults:  Orthopedic surgery  Hardware (Lines, Drains, Foley, Tubes): PIV  Activity:  As tolerated, per ortho recommendations , hip precautions  Therapy: PT/OT        Disposition Planning: [facility]    Powell Corona, FNP-BC      This note was partially generated using MModal Fluency Direct system, and there may be some incorrect words, spellings, and punctuation that were not noted in checking the note before saving.

## 2023-12-17 DIAGNOSIS — S72012A Unspecified intracapsular fracture of left femur, initial encounter for closed fracture: Secondary | ICD-10-CM

## 2023-12-17 LAB — CBC
HCT: 32.6 % — ABNORMAL LOW (ref 36.7–47.1)
HGB: 11.6 g/dL — ABNORMAL LOW (ref 12.5–16.3)
MCH: 32.4 pg (ref 23.8–33.4)
MCHC: 35.7 g/dL (ref 32.5–36.3)
MCV: 91 fL (ref 73.0–96.2)
MPV: 8.6 fL (ref 7.4–11.4)
PLATELETS: 223 x10ˆ3/uL (ref 140–440)
RBC: 3.58 x10ˆ6/uL — ABNORMAL LOW (ref 4.06–5.63)
RDW: 14 % (ref 12.1–16.2)
WBC: 9.6 x10ˆ3/uL (ref 3.6–10.2)

## 2023-12-17 LAB — BASIC METABOLIC PANEL
ANION GAP: 7 mmol/L (ref 4–13)
BUN/CREA RATIO: 23 — ABNORMAL HIGH (ref 6–22)
BUN: 21 mg/dL (ref 7–25)
CALCIUM: 8.8 mg/dL (ref 8.6–10.3)
CHLORIDE: 106 mmol/L (ref 98–107)
CO2 TOTAL: 26 mmol/L (ref 21–31)
CREATININE: 0.9 mg/dL (ref 0.60–1.30)
ESTIMATED GFR: 89 mL/min/1.73mˆ2 (ref >59)
GLUCOSE: 106 mg/dL (ref 74–109)
OSMOLALITY, CALCULATED: 281 mosm/kg (ref 270–290)
POTASSIUM: 3.3 mmol/L — ABNORMAL LOW (ref 3.5–5.1)
SODIUM: 139 mmol/L (ref 136–145)

## 2023-12-17 LAB — MAGNESIUM: MAGNESIUM: 1.9 mg/dL (ref 1.9–2.7)

## 2023-12-17 MED ORDER — APIXABAN 5 MG TABLET
2.5000 mg | ORAL_TABLET | Freq: Two times a day (BID) | ORAL | Status: DC
Start: 2023-12-17 — End: 2024-01-14
  Administered 2023-12-17 – 2023-12-18 (×3): 2.5 mg via ORAL
  Filled 2023-12-17 (×3): qty 1

## 2023-12-17 MED ORDER — POLYETHYLENE GLYCOL 3350 17 GRAM ORAL POWDER PACKET
17.0000 g | Freq: Every day | ORAL | Status: AC
Start: 2023-12-18 — End: ?

## 2023-12-17 MED ORDER — HYDROCORTISONE 1 % TOPICAL CREAM
TOPICAL_CREAM | Freq: Two times a day (BID) | CUTANEOUS | Status: DC | PRN
Start: 2023-12-17 — End: 2023-12-18
  Filled 2023-12-17: qty 28

## 2023-12-17 MED ORDER — APIXABAN 2.5 MG TABLET
2.5000 mg | ORAL_TABLET | Freq: Two times a day (BID) | ORAL | Status: AC
Start: 2023-12-17 — End: 2024-01-14

## 2023-12-17 MED ORDER — NICOTINE 21 MG/24 HR DAILY TRANSDERMAL PATCH
21.0000 mg | MEDICATED_PATCH | TRANSDERMAL | Status: DC
Start: 2023-12-17 — End: 2024-01-02

## 2023-12-17 MED ORDER — POTASSIUM CHLORIDE ER 20 MEQ TABLET,EXTENDED RELEASE(PART/CRYST)
40.0000 meq | ORAL_TABLET | ORAL | Status: AC
Start: 2023-12-17 — End: 2023-12-17
  Administered 2023-12-17 (×2): 40 meq via ORAL
  Filled 2023-12-17 (×2): qty 2

## 2023-12-17 MED ORDER — POTASSIUM BICARBONATE-CITRIC ACID 25 MEQ EFFERVESCENT TABLET
50.0000 meq | EFFERVESCENT_TABLET | ORAL | Status: AC
Start: 2023-12-17 — End: 2023-12-17
  Administered 2023-12-17: 50 meq via ORAL
  Filled 2023-12-17: qty 2

## 2023-12-17 NOTE — Progress Notes (Signed)
 Procedure(s), Procedure Date(s) and Post Op Day:    Procedure(s):  LEFT HIP HEMI ARTHROPLASTY BIPOLAR USING ACCOLADE IMPLANTS    12/12/2023 - 12/13/2023    4 Days Post-Op  -------------------   Stevie Netter, DO  Phone Number: [phone]       Gait Assessment/Treatment  Total Distance Ambulated: 70  Independence : contact guard assist  Assistive Device : walker, front wheeled  Gait Speed: SLOW  Deviations : narrow BOS  Impairments : endurance, pain, strength decreased  Comment: cues to keep feet further apart, cue for general safety  Anticoagulants (last 24 hours)       Date/Time Action Medication Dose    12/17/23 0819 Given    apixaban  (ELIQUIS ) tablet 2.5 mg           Patient Lines/Drains/Airways Status       Active Line / Dialysis Catheter / Dialysis Graft / Drain / Airway / Wound       Name Placement date Placement time Site Days    Peripheral IV Anterior;Distal;Left;Upper Arm 12/12/23  1037  -- 5    Wound  Right;Posterior Elbow 12/12/23  2252  -- 4    Wound  Left;Posterior Elbow 12/12/23  2253  -- 4    Wound  Incision Left Hip 12/13/23  1920  -- 3                   CBC:     9.6 (07/07 0411) \   11.6* (07/07 0411) /   223 (07/07 0411)      / 32.6* (07/07 0411) \           BMP:   139 (07/07 0411) 106 (07/07 0411) 21 (07/07 0411)    /     106 (07/07 0411)   3.3* (07/07 0411) 26 (07/07 0411) 0.90 (07/07 0411) \         Incision clean, dry. NO clinical evidence of DVT.  Pain and function improved.    DC planning.  Doing well from ortho standpoint.

## 2023-12-17 NOTE — Care Management Notes (Signed)
 Insurance auth initiated at [employer] today.   Auth was pended awaiting OT eval which was completed today.   Awaiting insurance status for IPR placement.

## 2023-12-17 NOTE — PT Treatment (Signed)
 [facility]  [address]  [place], [address]  [phone]  (Fax) [phone]  Rehabilitation Department  Physical Therapy Daily Inpatient Note    Date: 12/17/2023  Patient's Name: Corey [name]  Date of Birth: 04/30/1946  Height: Height: 170.2 cm (5' 7)  Weight: Weight: 70.4 kg (155 lb 4.8 oz)      Plan: Will continue under current POC.      Discharge Disposition: (P) inpatient rehabilitation facility, skilled nursing facility        Subjective/Objective/Assessment:  Flowsheet    12/17/23 0830   Rehab Session   Document Type therapy progress note (daily note)   PT Visit Date 12/17/23   General Information   Patient Profile Reviewed yes   Medical Lines Telemetry   Respiratory Status room air   Existing Precautions/Restrictions fall precautions   Pre Treatment Status   Pre Treatment Patient Status Patient supine in bed   Support Present Pre Treatment  None   Communication Pre Treatment  Charge Nurse   Communication Pre Treatment Comment cleared   Pre- Treatment Vital Signs   Vitals Comment not monitored   Pre-Treatment Pain   Pretreatment Pain Rating 6/10   Pre/Posttreatment Pain Comment right hip   Bed Mobility   Supine-Sit Independence moderate assist (50% patient effort)   Bed Mobility, Assistive Device bed rails;draw sheet   Transfer Assessment/Treatment   Sit-Stand Independence moderate assist (50% patient effort)   Stand-Sit Independence moderate assist (50% patient effort)   Sit-Stand-Sit, Assist Device walker, front wheeled   Transfer Impairments pain;endurance;strength decreased   Gait Assessment/Treatment   Total Distance Ambulated 16   Independence  minimum assist (75% patient effort);moderate assist (50% patient effort)   Assistive Device  walker, front wheeled   Impairments  endurance;pain;strength decreased;flexibility decreased   Comment small steps, standing rests, cues to walker and foot placement   Balance   Sitting Balance: Static fair + balance    Sit-to-Stand Balance fair balance   Standing Balance: Static fair balance   Standing Balance: Dynamic fair - balance   Post Treatment Status   Post Treatment Patient Status Patient sitting in bedside chair or w/c;Patient safety alarm activated   Patient Effort good   Post-Treatment Vital Signs   O2 Delivery Post Treatment room air   Post-Treatment Pain   Posttreatment Pain Rating 6/10   Cognitive Assessment/Intervention   Behavior/Mood Observations behavior appropriate to situation, WNL/WFL   Physical Therapy Time and Intention   Total PT Minutes: 11   Therapy Plan Review/Discharge Plan (PT)   Anticipated Discharge Disposition inpatient rehabilitation facility;skilled nursing facility     Patient in the bed, transferred supine to sit with mod of 1,  standing with mod assist, gaited with rw 42ft with  min to mod assist, standing rests, cues to walker and foot placement, reviewed hip precautions, recalled one hip precautions, left up in the chair with needs in reach, chair alarmed          Goals:     all bed mobility activities  modified independence  bed rails    modified independence  walker, rolling  250  without LOB, gait failure  and pt exhibiting safety awareness using AD         all transfers  modified independence  walker, rolling                     Intervention minutes: GAIT TRAINING 11 MINUTES    THERAPIST  Orie Molt,  PTA  12/17/2023, 11:31

## 2023-12-17 NOTE — PT Treatment (Signed)
 [facility]  [address]  [place], [address]  [phone]  (Fax) [phone]  Rehabilitation Department  Physical Therapy Daily Inpatient Note    Date: 12/17/2023  Patient's Name: Corey [name]  Date of Birth: [birth year]  Height: Height: 170.2 cm (5' 7)  Weight: Weight: 70.4 kg (155 lb 4.8 oz)      Plan: Will continue under current POC.      Discharge Disposition: (P) inpatient rehabilitation facility, skilled nursing facility        Subjective/Objective/Assessment:  Flowsheet    12/17/23 1324   Rehab Session   Document Type therapy progress note (daily note)   PT Visit Date 12/17/23   General Information   Patient Profile Reviewed yes   Medical Lines Telemetry   Respiratory Status room air   Existing Precautions/Restrictions posterior hip precautions   Pre Treatment Status   Pre Treatment Patient Status Patient sitting in bedside chair or w/c   Support Present Pre Treatment  None   Communication Pre Treatment  Charge Nurse   Communication Pre Treatment Comment cleared for PT   Pre-Treatment Pain   Pretreatment Pain Rating 0/10 - no pain   Pre/Posttreatment Pain Comment right hip   Bed Mobility   Comment btb with min assist   Transfer Assessment/Treatment   Sit-Stand Independence contact guard assist   Stand-Sit Independence contact guard assist   Sit-Stand-Sit, Assist Device walker, front wheeled   Transfer Impairments endurance;pain;strength decreased   Gait Assessment/Treatment   Total Distance Ambulated 70   Independence  contact guard assist   Assistive Device  walker, front wheeled   Deviations  narrow BOS   Impairments  endurance;pain;strength decreased   Comment cues to keep feet further apart, cue for general safety   Balance   Sitting Balance: Static fair + balance   Sitting, Dynamic (Balance) fair balance   Sit-to-Stand Balance fair balance   Standing Balance: Static fair balance   Standing Balance: Dynamic fair - balance   Post Treatment Status   Post  Treatment Patient Status Patient supine in bed   Communication Post Treatement Charge Nurse   Patient Effort good   Post-Treatment Pain   Posttreatment Pain Rating 4/10   Cognitive Assessment/Intervention   Behavior/Mood Observations behavior appropriate to situation, WNL/WFL   Physical Therapy Time and Intention   Total PT Minutes: 10   Therapy Plan Review/Discharge Plan (PT)   Anticipated Discharge Disposition inpatient rehabilitation facility;skilled nursing facility     Patient in the chair, standing with cga of 1, gaited with rw 94ft , cues to keep feet apart, standing rests due to both ues getting tired,  needed min assist to get back in the bed, reviewed hip precautions, recalled one correctly, found patient with feet crossed this afternoon, bed alarmed           Goals:     all bed mobility activities  modified independence  bed rails    modified independence  walker, rolling  250  without LOB, gait failure  and pt exhibiting safety awareness using AD         all transfers  modified independence  walker, rolling                     Intervention minutes: GAIT TRAINING 10 MINUTES    THERAPIST  Orie Molt, PTA  12/17/2023, 15:41

## 2023-12-17 NOTE — OT Evaluation (Signed)
 [facility]  [address]  [place], [address]  [phone]  (Fax) [phone]  Rehabilitation Services  Occupational Therapy Inpatient Initial Evaluation      Patient Name: Corey [name]  Date of Birth: [birth year]  Height: Height: 170.2 cm (5' 7)  Weight: Weight: 70.4 kg (155 lb 4.8 oz)  Room/Bed: 372/A  Payor: [employer] / Plan: [employer] PPO / Product Type: PPO /         PMH:   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    Corey [name]  Dr Skipper    Hypercholesterolemia     Hypertension     IBS (irritable bowel syndrome)     Left foot pain 12/21/2021    Leukocytosis     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 Prior to admission, he was independent in ADLs and functional mobility. During OT eval, patient was alert, oriented x4, cooperative and able to follow multistep commands. He has full UB AROM and good (4/5) UB strength. OT educated patient on use of ADL kit and patient was able to return demo with SBA. Patient will benefit from post-acute rehab upon discharge. OT to follow up during acute stay for  ADL training, DME/AE training and functional transfer training.    Discharge Needs:   Equipment Recommendation:  reacher, sock aid, long handled sponge, long shoe horn     The patient presents with mobility limitations due to impaired range of motion that significantly impair/prevent patient's ability to participate in mobility-related activities of daily living (MRADLs) including  ambulation and transfers in order to safely complete, toileting, bathing, safely entering/exiting the home, in reasonable time. This functional mobility deficit can be sufficiently resolved with the use of a Anticipated Equipment Needs at Discharge: (P) bathing equipment, dressing equipment, reacher  in order to decrease the risk of falls, morbidity, and mortality in performance of these MRADLs.  Patient is able to safely use this assistive device.    Discharge Disposition:  inpatient rehabilitation facility, skilled nursing facility    JUSTIFICATION OF DISCHARGE RECOMMENDATION   Based on current diagnosis, functional performance prior to admission, and current functional performance, this patient requires continued OT services in Anticipated Discharge Disposition: (P) inpatient rehabilitation facility, skilled nursing facility  in order to achieve significant functional improvements.    Plan:   Current Intervention:  Predicted Duration of Therapy: (P) until discharge    To provide Occupational therapy services  Therapy Frequency: (P) minimum of 1x/week for duration of Predicted Duration of Therapy: (P) until discharge  .       The risks/benefits of therapy have been discussed with the patient/caregiver and he/she is in agreement with the established plan of care.       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    Corey [name]  Dr Skipper    Hypercholesterolemia     Hypertension     IBS (irritable bowel syndrome)     Left foot pain 12/21/2021    Leukocytosis     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    Narcolepsy     Neck pain 12/21/2021    Nephritis 12/21/2021    OSA (obstructive sleep apnea)     Osteoarthritis     Paresthesia 12/21/2021    Previous back surgery 12/21/2021    Right carotid bruit     Right hip pain 12/21/2021    Thigh pain 12/21/2021    Thyroid nodule     Weight loss, non-intentional 12/21/2021         SURGICAL HISTORY:   Past Surgical History:   Procedure Laterality Date    CARDIAC CATHETERIZATION  07/13/2021    [id number]    Dr Garnette Ward    DENTAL SURGERY      HX BACK SURGERY      HX COLONOSCOPY  2019    had done at [facility]    HX CORONARY ARTERY BYPASS GRAFT  08/03/2021    Dr Skipper [facility]    HX HERNIA REPAIR      HX LAP CHOLECYSTECTOMY      ORTHOPEDIC SURGERY      PROSTATE SURGERY  10/2022    Dr. Emilio       ipp    INSERT FLOW SHEET     12/17/23 0847   Rehab Session   Document Type evaluation   OT Visit Date 12/17/23   Total OT Minutes: 10   Patient Effort good   General Information   Patient Profile Reviewed yes   Medical Lines Telemetry   Respiratory Status room air   Existing Precautions/Restrictions posterior hip precautions   Pre Treatment Status   Pre Treatment Patient Status Patient sitting in bedside chair or w/c;Call light within reach;Telephone within reach;Patient safety alarm activated;Nurse approved session   Support Present Pre Treatment  None   Communication Pre Treatment  Charge Nurse   Communication Pre Treatment Comment Cleared for OT   Mutuality/Individual Preferences   Anxieties, Fears or Concerns None voiced   Individualized Care Needs Assistance with ADLs   Patient-Specific Goals  (Include Timeframe) DC when able   Plan of Care Reviewed  With patient   Living Environment   Lives With spouse   Living Arrangements house   Home Accessibility stairs to enter home   Living Environment Comment He has a walk-in shower and shower chair   Home Main Entrance   Number of Stairs, Main Entrance one   Functional Level Prior   Ambulation 0 - independent   Transferring 0 - independent   Toileting 0 - independent   Bathing 0 - independent   Dressing 0 - independent   Eating 0 - independent   Communication 0 - understands/communicates without difficulty   Swallowing 0-->swallows foods/liquids without difficulty   Self-Care   Dominant Hand right   Vital Signs   Vitals Comment Pulse ox disconnected   Coping/Psychosocial   Observed Emotional State calm;cooperative;pleasant   Verbalized Emotional State acceptance   Cognition   Behavior/Mood Observations behavior appropriate to situation, WNL/WFL   Orientation Status oriented x 4   Attention WNL/WFL   Follows Commands WFL   RUE Assessment   RUE Assessment WFL- Within Functional Limits   LUE Assessment   LUE Assessment WFL- Within Functional Limits   Grip Strength   Grip Left (4/5) good, left   Right Grip (4/5) good, right   Lower Body Dressing Assessment/Training   Assistive Devices reacher;sock-aid   Position sitting   DRESSING ASSESSED Don Socks;Doff Socks   Independence Level  standby assist   Post Treatment Status   Post Treatment Patient Status Patient sitting in bedside chair or w/c;Call light within reach;Telephone within reach;Patient safety alarm activated   Support Present Post Treatment  None   Care Plan Goals   OT Rehab Goals Bathing Goal;LB Dressing Goal;Toileting Goal;Grooming Goal   Bathing Goal   Bathing Goal, Date Established 12/17/23   Bathing Goal, Time to Achieve by discharge   Bathing Goal, Activity Type all bathing tasks   Bathing Goal, Independence Level modified independence   Bathing Goal, Adaptive Equipment shower chair;sponge, long handled    Grooming Goal   Grooming Goal, Date Established 12/17/23   Grooming Goal, Time to Achieve by discharge   Grooming Goal, Activity Type all grooming tasks   Grooming Goal, Independence  modified independence   LB Dressing Goal   LB Dressing Goal, Date Established 12/17/23   LB Dressing Goal, Time to Achieve by discharge   LB Dressing Goal, Activity Type all lower body dressing tasks   LB Dressing Goal, Independence Level modified independence   LB Dressing Goal, Adaptive Equipment reacher;shoe horn, long handled;sock-aid   Toileting Goal   Toileting Goal, Date Established 12/17/23   Toileting Goal, Time to Achieve by discharge   Toileting Goal, Activity Type all toileting tasks   Toileting Goal, Independence Level modified independence   Planned Therapy Interventions, OT Eval   Planned Therapy Interventions ADL retraining;ROM (range of motion)   Clinical Impression   Functional Level at Time of Session Patient is a pleasant 78 year old male admitted for L hip fracture. Prior to admission, he was independent in ADLs and functional mobility. During OT eval, patient was alert, oriented x4, cooperative and able to follow multistep commands. He has full UB AROM and good (4/5) UB strength. OT educated patient on use of ADL kit and patient was able to return demo with SBA. Patient will benefit from post-acute rehab upon discharge. OT to follow up during acute stay for ADL training, DME/AE training and functional transfer training.   Criteria for Skilled Therapeutic Interventions Met (OT) yes;meets criteria;skilled treatment is necessary  Rehab Potential good   Therapy Frequency minimum of 1x/week   Predicted Duration of Therapy until discharge   Anticipated Equipment Needs at Discharge bathing equipment;dressing equipment;reacher   Anticipated Discharge Disposition inpatient rehabilitation facility;skilled nursing facility   Evaluation Complexity Justification   Occupational Profile Review Brief history   Performance  Deficits 1-3 deficits   Clinical Decision Making Low analytic complexity   Evaluation Complexity Low       TREATMENT PLAN: THERAPEUTIC EXERCISES, THERAPEUTIC ACTIVITIES, ADL/IADL TRAINING, and DME/AE TRAINING  EVALUATION COMPLEXITY: CLINICAL DECISION MAKING OF LOW COMPLEXITY AS INDICATED BY PMH, OCCUPATIONAL THERAPY ASSESSMENT OF MUSCULOSKELETAL AND NEUROLOGICAL SYSTEMS AND ACTIVITY LIMITATIONS. CLINICAL PRESENTATION IS STABLE AND UNCOMPLICATED.      EVALUATION 10 minutes    Therapist:      Dajon Rowe, OT,12/17/2023 11:39

## 2023-12-17 NOTE — Care Plan (Signed)
 Problem: Adult Inpatient Plan of Care  Goal: Plan of Care Review  Outcome: Ongoing (see interventions/notes)  Goal: Patient-Specific Goal (Individualized)  Outcome: Ongoing (see interventions/notes)  Goal: Absence of Hospital-Acquired Illness or Injury  Outcome: Ongoing (see interventions/notes)  Intervention: Prevent Skin Injury  Recent Flowsheet Documentation  Taken 12/16/2023 2300 by Fountain Derusha F, RN  Skin Protection:   adhesive use limited   tubing/devices free from skin contact   transparent dressing maintained   incontinence pads utilized  Goal: Optimal Comfort and Wellbeing  Outcome: Ongoing (see interventions/notes)  Goal: Rounds/Family Conference  Outcome: Ongoing (see interventions/notes)     Problem: Orthopaedic Fracture  Goal: Absence of Bleeding  Outcome: Ongoing (see interventions/notes)  Goal: Bowel Elimination  Outcome: Ongoing (see interventions/notes)  Goal: Absence of Embolism Signs and Symptoms  Outcome: Ongoing (see interventions/notes)  Goal: Fracture Stability  Outcome: Ongoing (see interventions/notes)  Goal: Optimal Functional Ability  Outcome: Ongoing (see interventions/notes)  Goal: Absence of Infection Signs and Symptoms  Outcome: Ongoing (see interventions/notes)  Goal: Effective Tissue Perfusion  Outcome: Ongoing (see interventions/notes)  Goal: Optimal Pain Control and Function  Outcome: Ongoing (see interventions/notes)  Goal: Effective Oxygenation and Ventilation  Outcome: Ongoing (see interventions/notes)     Problem: Fall Injury Risk  Goal: Absence of Fall and Fall-Related Injury  Outcome: Ongoing (see interventions/notes)     Problem: Skin Injury Risk Increased  Goal: Skin Health and Integrity  Outcome: Ongoing (see interventions/notes)  Intervention: Optimize Skin Protection  Recent Flowsheet Documentation  Taken 12/16/2023 2300 by Jaquilla Woodroof F, RN  Pressure Reduction Techniques:   Heels elevated off of the bed   Patient turned q 2 hours   Moisture, shear and nutrition are maximized    Mobility is maximized  Pressure Reduction Devices:   Pressure redistributing mattress utilized   Repositioning wedges/pillows utilized  Skin Protection:   adhesive use limited   tubing/devices free from skin contact   transparent dressing maintained   incontinence pads utilized     Problem: Wound  Goal: Optimal Coping  Outcome: Ongoing (see interventions/notes)  Goal: Optimal Functional Ability  Outcome: Ongoing (see interventions/notes)  Goal: Absence of Infection Signs and Symptoms  Outcome: Ongoing (see interventions/notes)  Goal: Improved Oral Intake  Outcome: Ongoing (see interventions/notes)  Goal: Optimal Pain Control and Function  Outcome: Ongoing (see interventions/notes)  Goal: Skin Health and Integrity  Outcome: Ongoing (see interventions/notes)  Intervention: Optimize Skin Protection  Recent Flowsheet Documentation  Taken 12/16/2023 2300 by Simon Llamas F, RN  Pressure Reduction Techniques:   Heels elevated off of the bed   Patient turned q 2 hours   Moisture, shear and nutrition are maximized   Mobility is maximized  Pressure Reduction Devices:   Pressure redistributing mattress utilized   Repositioning wedges/pillows utilized  Goal: Optimal Wound Healing  Outcome: Ongoing (see interventions/notes)  Intervention: Promote Wound Healing  Recent Flowsheet Documentation  Taken 12/16/2023 2300 by Tharun Cappella F, RN  Pressure Reduction Techniques:   Heels elevated off of the bed   Patient turned q 2 hours   Moisture, shear and nutrition are maximized   Mobility is maximized  Pressure Reduction Devices:   Pressure redistributing mattress utilized   Repositioning wedges/pillows utilized

## 2023-12-17 NOTE — PT Evaluation (Addendum)
 [facility]  [address]  [place], [address]  [phone]  (Fax) [phone]  Rehabilitation Services  Physical Therapy Inpatient Initial Evaluation    Patient Name: Corey [name]  Date of Birth: 04-28-1946  Height: Height: 170.2 cm (5' 7)  Weight: Weight: 70.4 kg (155 lb 4.8 oz)  Room/Bed: 372/A  Payor: [employer] / Plan: [employer] PPO / Product Type: PPO /       PMH:  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    [place]  Dr Skipper    Hypercholesterolemia     Hypertension     IBS (irritable bowel syndrome)     Left foot pain 12/21/2021    Leukocytosis     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 THE PT IS VERY CONFUSED  AND WAS  A LITTLE IMPULSIVE. HIS SPOUSE INDICATES THAT THIS IS NOT NORMAL FOR HIM. HE HAD NORMAL MOBILITY PRIOR TO [facility]. THE PT NEEDED + 1 MOD ASSISTANCE FOR SAFETY WITH AMBULATION AND TRANSITIONS.    Total Distance Ambulated: 10  Independence: moderate assist (50% patient effort), minimum assist (75% patient effort)  Assistive Device: walker, front wheeled      Discharge Needs:     Equipment Recommendation: raised toilet seat, shower chair, front wheeled walker      The patient presents with mobility limitations due to impaired range of motion, impaired strength, and impaired functional activity tolerance that significantly impair/prevent patient's ability to participate in mobility-related activities of daily living (MRADLs) including  ambulation and transfers in order to safely complete, toileting. This functional mobility deficit can be sufficiently resolved with the use of a raised toilet seat, shower chair, front wheeled walker  in order to decrease the risk of falls, morbidity, and mortality in performance of these MRADLs.  Patient is able to safely use this assistive device.    Discharge Disposition: skilled nursing facility, inpatient rehabilitation facility    JUSTIFICATION OF DISCHARGE RECOMMENDATION   Based on current diagnosis, functional performance prior to admission, and current functional performance, this patient requires continued PT services in skilled nursing facility, inpatient rehabilitation facility in order to achieve significant functional improvements in these deficit areas: gait, locomotion, and balance, joint integrity and mobility.  Plan:   Current Intervention: gait training, bed mobility training, home exercise program, transfer training  To provide physical therapy services 1x/day, 2x/day, minimum of 1x/week (M-F)  for duration of until discharge.    The risks/benefits of therapy have been discussed with the patient/caregiver and he/she is in agreement with the established plan of care.       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    Narcolepsy     Neck pain 12/21/2021    Nephritis 12/21/2021    OSA (obstructive sleep apnea)     Osteoarthritis     Paresthesia 12/21/2021    Previous back surgery 12/21/2021    Right carotid bruit     Right hip pain 12/21/2021    Thigh pain 12/21/2021    Thyroid nodule     Weight loss, non-intentional 12/21/2021            Past Surgical History:   Procedure Laterality Date    CARDIAC CATHETERIZATION  07/13/2021    [id number]    Dr Garnette Ward    DENTAL SURGERY      HX BACK SURGERY      HX COLONOSCOPY  2019    had done at [facility]    HX CORONARY ARTERY BYPASS GRAFT  08/03/2021    Dr Skipper [facility]    HX HERNIA REPAIR      HX LAP CHOLECYSTECTOMY      ORTHOPEDIC SURGERY      PROSTATE SURGERY  10/2022    Dr. Emilio                 12/14/23 1300   Rehab Session   Document Type evaluation   PT Visit Date 12/14/23   Total PT Minutes: 32   Patient Effort good   General Information   Patient Profile Reviewed yes   Pertinent History of Current Functional Problem fall at home , THA 7/3   Medical Lines Telemetry   Respiratory Status room air   Existing Precautions/Restrictions fall precautions   Mutuality/Individual Preferences   Patient would like to participate in bedside shift report Yes   Living Environment   Lives With spouse   Home Assessment: No Problems Identified   Home Accessibility no concerns   Functional Level Prior   Ambulation 0 - independent   Transferring 0 - independent   Toileting 0 - independent   Bathing 0 - independent   Prior Functional Level Comment NO AMS PER SPOUSE   Pre Treatment Status   Pre Treatment Patient Status Patient supine in bed;Nurse approved session   Support Present Pre Treatment  Family  present   Communication  Pre Treatment  Charge Nurse   Cognition   Behavior/Mood Observations confused   Orientation Status place;situation   Attention mild impairment   Follows Commands follows two step commands   Vital Signs   O2 Delivery Post Treatment room air   Pain Assessment   Pretreatment Pain Rating 4/10   RUE Assessment   RUE Assessment WFL for stated baseline   LUE Assessment   LUE Assessment WFL for stated baseline   RLE Assessment   RLE Assessment WFL for stated baseline   LLE Assessment   LLE Assessment X-Exceptions   LLE ROM Hip   LLE Strength 2/5 MMT   LLE Other GUARDED OF LLE   Trunk Assessment   Trunk Assessment WFL for stated baseline   Mobility Assessment/Training   Comment THE PT HAD A STEP TO GAIT WITH FWW, HEAVY UE LEAN AND NEEDED CUEING TO KEEP COG IN WALKER AND TO MAKE SAFE TRANSITIONS.   Bed Mobility   Supine-Sit Independence moderate assist (50% patient effort)   Bed Mobility, Assistive Device bed rails   Transfer Assessment/Treatment   Sit-Stand Independence minimum assist (75% patient effort)   Sit-Stand-Sit, Assist Device walker, front wheeled   Gait Assessment/Treatment   Total Distance Ambulated 30   Independence  moderate assist (50% patient effort)   Assistive Device  walker, front wheeled   Gait Speed SLOW   Deviations  narrow BOS;limb motion velocity decreased;step length decreased;toe-to-floor clearance decreased   Balance   Sitting Balance: Static fair balance   Sitting, Dynamic (Balance) fair - balance   Sit-to-Stand Balance fair - balance   Standing Balance: Static fair - balance   Therapeutic Exercise/Activity   Comment ANKLE PUMPS, QUAD SETS, STANDING HEEL RAISES , AMBULATION IN HALL   Post Treatment Status   Post Treatment Patient Status Patient sitting in bedside chair or w/c;Patient safety alarm activated;Call light within reach   Support Present Post Treatment  Family present   Publishing copy   Communication Post Treatment Comment CONFUSION   Physical Therapy Clinical  Impression   Assessment THE PT EXHIBITED FAIR MOBILITY  WITH TRANSFERS AND GAIT  BUT FATIGUED QUICKLY AND WAS LIMITED BY PAIN. THE PT IS VERY CONFUSED  AND WAS  A LITTLE IMPULSIVE. HIS SPOUSE INDICATES THAT THIS IS NOT NORMAL FOR HIM. HE HAD NORMAL MOBILITY PRIOR TO [facility]. THE PT NEEDED + 1 MOD ASSISTANCE FOR SAFETY WITH AMBULATION AND TRANSITIONS.   Criteria for Skilled Therapeutic yes   Impairments Found (describe specific impairments) gait, locomotion, and balance;joint integrity and mobility   Functional Limitations in Following  self-care;other (see comments)  (AMBULATION, TRANSFERS)   Rehab Potential fair   Therapy Frequency 1x/day;2x/day;minimum of 1x/week  (M-F)   Predicted Duration of Therapy Intervention (days/wks) until discharge   Anticipated Equipment Needs at Discharge (PT) raised toilet seat;shower chair;front wheeled walker   Evaluation Complexity Justification   Patient History: Co-morbidity/factors that impact Plan of Care Fracture: cause pain &/or impaired function;Surgical procedure: causing pain &/or impaired function   Examination Components 4 or more Exam elements addressed   Presentation Stable: Uncomplicated, straight-forward, problem focused   Clinical Decision Making Low complexity   Evaluation Complexity Low complexity   Care Plan Goals   PT Rehab Goals Bed Mobility Goal;Gait Training Goal;Transfer Training Goal   Bed Mobility Goal   Bed Mobility Goal, Date Established 12/14/23   Bed Mobility Goal, Time to Achieve by discharge   Bed Mobility Goal, Activity Type all bed  mobility activities   Bed Mobility Goal, Current Status moderate assist (50% patient effort)   Bed Mobility Goal, Independence Level modified independence   Bed Mobility Goal, Assistive Device bed rails   Gait Training  Goal, Distance to Achieve   Gait Training  Goal, Date Established 12/14/23   Gait Training  Goal, Time to Achieve by discharge   Gait Training  Goal, Current Status moderate assist (50% patient effort)   Gait  Training  Goal, Independence Level modified independence   Gait Training  Goal, Assist Device walker, rolling   Gait Training  Goal, Distance to Achieve 250   Gait Training  Goal, Additional Goal without LOB, gait failure  and pt exhibiting safety awareness using AD   Transfer Training Goal   Transfer Training Goal, Date Established 12/14/23   Transfer Training Goal, Time to Achieve by discharge   Transfer Training Goal, Activity Type all transfers   Transfer Training Goal, Current Status minimum assist (75% patient effort)   Transfer Training Goal, Independence Level modified independence   Transfer Training Goal, Assist Device walker, rolling   Planned Therapy Interventions, PT Eval   Planned Therapy Interventions (PT) gait training;bed mobility training;home exercise program;transfer training   (INSERT FLOWSHEET)      INTERVENTION MINUTES: EVALUATION 15 minutes and GAIT TRAINING 20 MINUTES     EVALUATION COMPLEXITY : CLINICAL DECISION MAKING OF LOW COMPLEXITY AS INDICATED BY PMH, PHYSICAL THERAPY ASSESSMENT OF MUSCULOSKELETAL AND NEUROLOGICAL SYSTEMS AND ACTIVITY LIMITATIONS. CLINICAL PRESENTATION IS STABLE AND UNCOMPLICATED     Therapist:     Shawnee Moore, PT  12/14/2023, [id number]      I

## 2023-12-18 DIAGNOSIS — Z7982 Long term (current) use of aspirin: Secondary | ICD-10-CM

## 2023-12-18 DIAGNOSIS — Z880 Allergy status to penicillin: Secondary | ICD-10-CM

## 2023-12-18 DIAGNOSIS — F419 Anxiety disorder, unspecified: Secondary | ICD-10-CM

## 2023-12-18 DIAGNOSIS — N400 Enlarged prostate without lower urinary tract symptoms: Secondary | ICD-10-CM

## 2023-12-18 DIAGNOSIS — K219 Gastro-esophageal reflux disease without esophagitis: Secondary | ICD-10-CM

## 2023-12-18 DIAGNOSIS — Z79899 Other long term (current) drug therapy: Secondary | ICD-10-CM

## 2023-12-18 DIAGNOSIS — S7292XA Unspecified fracture of left femur, initial encounter for closed fracture: Secondary | ICD-10-CM

## 2023-12-18 LAB — BASIC METABOLIC PANEL
ANION GAP: 4 mmol/L (ref 4–13)
BUN/CREA RATIO: 18 (ref 6–22)
BUN: 15 mg/dL (ref 7–25)
CALCIUM: 9.1 mg/dL (ref 8.6–10.3)
CHLORIDE: 109 mmol/L — ABNORMAL HIGH (ref 98–107)
CO2 TOTAL: 27 mmol/L (ref 21–31)
CREATININE: 0.85 mg/dL (ref 0.60–1.30)
ESTIMATED GFR: 90 mL/min/1.73mˆ2 (ref >59)
GLUCOSE: 111 mg/dL — ABNORMAL HIGH (ref 74–109)
OSMOLALITY, CALCULATED: 281 mosm/kg (ref 270–290)
POTASSIUM: 4.9 mmol/L (ref 3.5–5.1)
SODIUM: 140 mmol/L (ref 136–145)

## 2023-12-18 LAB — CBC
HCT: 32.7 % — ABNORMAL LOW (ref 36.7–47.1)
HGB: 11.4 g/dL — ABNORMAL LOW (ref 12.5–16.3)
MCH: 32 pg (ref 23.8–33.4)
MCHC: 34.8 g/dL (ref 32.5–36.3)
MCV: 91.9 fL (ref 73.0–96.2)
MPV: 8 fL (ref 7.4–11.4)
PLATELETS: 244 x10ˆ3/uL (ref 140–440)
RBC: 3.56 x10ˆ6/uL — ABNORMAL LOW (ref 4.06–5.63)
RDW: 13.7 % (ref 12.1–16.2)
WBC: 9.9 x10ˆ3/uL (ref 3.6–10.2)

## 2023-12-18 LAB — MAGNESIUM: MAGNESIUM: 1.9 mg/dL (ref 1.9–2.7)

## 2023-12-18 MED ORDER — MIRABEGRON ER 25 MG TABLET,EXTENDED RELEASE 24 HR
25.0000 mg | ORAL_TABLET | Freq: Every day | ORAL | Status: DC
Start: 2023-12-18 — End: 2023-12-18
  Administered 2023-12-18: 0 mg via ORAL

## 2023-12-18 NOTE — Care Management Notes (Signed)
 [facility]  Care Management Note    Patient Name: Corey [name]  Date of Birth: May 14, 1946  Sex: male  Date/Time of Admission: 12/12/2023 10:13 AM  Room/Bed: 372/A  Payor: [employer] / Plan: [id number] ADVANTAGE PPO / Product Type: PPO /    LOS: 6 days   Primary Care Providers:  Jacolyn Greig CROME, FNP, FNP (General)    Admitting Diagnosis:  Hip fracture [S72.009A]    Assessment:   Pt has been denied by his insurance to go to [facility]. A peer to peer was completed and pt was still denied. Pt/wife want pt to go to outpatient physical therapy. They do no want skilled nursing placement. CM attempted to make appointment at [facility], but they are booked until August. Pt/wife decided to use [facility]. CM made referral to [facility] and pt is scheduled for 12/20/23 at 10:15 AM.    Discharge Plan:  Home (Patient/Family Member/other) (code 1)      The patient will continue to be evaluated for developing discharge needs.     Case Manager: Knox Ahle, [place]  Phone: [phone]

## 2023-12-18 NOTE — Progress Notes (Signed)
 [facility]               IP PROGRESS NOTE      [name], [name]  Date of Admission:  12/12/2023  Date of Birth:  08/27/1945  Date of Service:  12/18/2023    Hospital Day:  LOS: 6 days       Subjective:   Corey [name] is a 78 y.o. male who is seen and examined for left femur fracture, hypertension, CAD.  He denies new complaints today.  Labs are stable.  Await rehab placement.      Vital Signs:  Temp (24hrs) Max:36.9 C (98.4 F)      Temperature: 36.9 C (98.4 F)  BP (Non-Invasive): 133/69  MAP (Non-Invasive): 84 mmHG  Heart Rate: 78  Respiratory Rate: 18  SpO2: 96 %    Current Medications:  acetaminophen  (TYLENOL ) tablet, 650 mg, Oral, Q4H PRN  alcohol  62 % (NOZIN NASAL SANITIZER) nasal swab packet, 1 Each, Each Nostril, 2x/day  apixaban  (ELIQUIS ) tablet, 2.5 mg, Oral, 2x/day  aspirin  chewable tablet 81 mg, 81 mg, Oral, Daily  atorvastatin  (LIPITOR) tablet, 40 mg, Oral, QPM  [Held by provider] benztropine  (COGENTIN ) tablet, 1 mg, Oral, NIGHTLY  bisacodyl  (DULCOLAX) rectal suppository, 10 mg, Rectal, Daily PRN  ezetimibe  (ZETIA ) tablet, 10 mg, Oral, QPM  famotidine  (PEPCID ) tablet, 40 mg, Oral, 2x/day  HYDROcodone -acetaminophen  (NORCO) 7.5 mg-325 mg per tablet, 1 Tablet, Oral, Q4H PRN  hydrocortisone  (CORTAID) 1% topical cream, , Apply Topically, 2x/day PRN  losartan  (COZAAR ) tablet, 50 mg, Oral, 2x/day  metoprolol  succinate (TOPROL -XL) 24 hr extended release tablet, 12.5 mg, Oral, Daily  mirabegron  (MYRBETRIQ ) 24 hr extended release tablet, 25 mg, Oral, Daily  nicotine  (NICODERM CQ ) transdermal patch (mg/24 hr), 21 mg, Transdermal, Q24H  ondansetron  (ZOFRAN ) 2 mg/mL injection, 4 mg, Intravenous, Q6H PRN  pantoprazole  (PROTONIX ) delayed release tablet, 40 mg, Oral, NIGHTLY  polyethylene glycol (MIRALAX ) oral packet, 17 g, Oral, Daily  ramelteon  (ROZEREM ) tablet, 8 mg, Oral, NIGHTLY  risperiDONE  (risperDAL ) tablet, 0.5 mg, Oral, 2x/day  sennosides-docusate sodium  (SENOKOT-S) 8.6-50mg  per  tablet, 1 Tablet, Oral, 2x/day PRN  sertraline  (ZOLOFT ) tablet, 100 mg, Oral, NIGHTLY  tamsulosin  (FLOMAX ) capsule, 0.4 mg, Oral, NIGHTLY        Current Orders:  Active Orders   Diet    DIET REGULAR Do you want to initiate MNT Protocol? Yes     Frequency: All Meals     Number of Occurrences: 1 Occurrences   Nursing    ACTIVITY     Frequency: UNTIL DISCONTINUED     Number of Occurrences: Until Specified    APIXABAN  NURSING ORDER     Frequency: UNTIL DISCONTINUED     Number of Occurrences: Until Specified     Order Comments: Nursing Instructions:    1.  Print apixaban  (Eliquis ) guide using the link in this order.   2.  Nurse to provide apixaban  patient guide to all patients and be sure that all new starts have received education by the trained anticoagulation educator.  If additional questions, contact pharmacy.           APPLY ABDUCTION PILLOW BETWEEN KNEES WHILE IN BED     Frequency: PRN     Number of Occurrences: Until Specified    APPLY AV IMPULSE BOOT     Frequency: ONE TIME     Number of Occurrences: 1 Occurrences    BLADDER SCAN IF PATIENT HAS NOT VOIDED WITHIN 4 HOURS     Frequency: Q4H  PRN     Number of Occurrences: Until Specified    CATHETERIZE WITH A FOLEY CATH, IF > LEAVE FOLEY IN FOR 24 HOURS.  DISCONTINUE FOLEY CATEHTER 24 HRS POST-OP UNLESS CONTAINDICATED DUE TO MEDICAL NECESSITY     Frequency: Q6H PRN     Number of Occurrences: Until Specified    DECUBITUS PRECAUTIONS: CHECK SACRUM, HEELS, OCCIPUT AND ELBOWS REGULARLY. PAD ANKLES, ELBOWS, KNEES AND HEELS.  TURN PATIENT IN BED.     Frequency: UNTIL SPECIFIED     Number of Occurrences: Until Specified    DISCHARGE PATIENT FROM PACU WHEN PATIENT MEETS CRITERIA     Frequency: ONE TIME     Number of Occurrences: 1 Occurrences    HEEL PROTECTORS     Frequency: CONTINUOUS     Number of Occurrences: Until Specified    ICE TO AFFECTED AREA     Frequency: PRN     Number of Occurrences: Until Specified    INCENTIVE SPIROMETRY NURSING     Frequency: Q1H WA      Number of Occurrences: 250 Occurrences    INITIATE CPR PER ACLS PROTOCOLS     Frequency: UNTIL DISCONTINUED     Number of Occurrences: Until Specified    INTAKE AND OUTPUT QSHIFT     Frequency: QSHIFT     Number of Occurrences: Until Specified    MAINTAIN AV IMPULSE BOOT     Frequency: CONTINUOUS X 72 HRS     Number of Occurrences: 72 Hours    NOTIFY ANESTHESIA ONCE DISCHARGE CRITERIA MET     Frequency: UNTIL SPECIFIED     Number of Occurrences: Until Specified    NOTIFY MD     Frequency: PRN     Number of Occurrences: Until Specified    Notify MD Vital Signs     Frequency: PRN     Number of Occurrences: Until Specified    NOTIFY MD VITAL SIGNS     Frequency: PRN     Number of Occurrences: Until Specified    Notify MD Vital Signs     Frequency: PRN     Number of Occurrences: Until Specified     Order Comments: Notify MD if urine output is less than 500 mL 8 hours after surgery.      NURSE TO ENTER SECONDARY ORDER Other - (specify in comments) (POC WHOLE BLOOD GLUCOSE (POC10) ON PATIENTS WHO ARE INSULIN DEPENDENT AND NOTIFY ANESTHESIA IF BLOOD SUGAR IS LESS THAN 80 OR GREATER THAN 200)     Frequency: PRN     Number of Occurrences: Until Specified     Order Comments: Notify Anesthesia if blood sugar is less than 80 or greater than 200      NURSING DIET INFORMATION (NOT A DIET ORDER) - ADVANCE DIET AS TOLERATED.  NURSING TO ENTER SECONDARY ORDER FOR ACUTAL DIET ORDER.     Frequency: UNTIL DISCONTINUED     Number of Occurrences: Until Specified     Order Comments: If instructions require a change in diet, nursing needs to enter appropriate secondary diet order.      PT IS HIGH RISK FOR VENOUS THROMBOEMBOLISM     Frequency: CONTINUOUS     Number of Occurrences: Until Specified    PT IS INTERMEDIATE RISK FOR VENOUS THROMBOEMBOLISM     Frequency: CONTINUOUS     Number of Occurrences: Until Specified    PULSE OXIMETRY Q4H     Frequency: Q4H     Number of Occurrences: Until Specified  REINFORCE DRESSING     Frequency:  PRN     Number of Occurrences: Until Specified    TELEMETRY MONITORING - Continuous     Frequency: CONTINUOUS     Number of Occurrences: Until Specified    TRAPEZE TO BED     Frequency: CONTINUOUS     Number of Occurrences: Until Specified    VITAL SIGNS  Q4H     Frequency: Q4H     Number of Occurrences: Until Specified    WAS PATIENT ON APIXABAN  PRIOR TO ADMISSION?     Frequency: UNTIL DISCONTINUED     Number of Occurrences: Until Specified   Consult    IP CONSULT TO CARE MANAGEMENT     Frequency: ONE TIME     Number of Occurrences: 1 Occurrences    IP CONSULT TO ORTHOPEDICS Requested Provider; STEVIE NETTER; 12/12/2023; 2:56 PM     Frequency: ONE TIME     Number of Occurrences: 1 Occurrences   OT    OT EVAL & TREAT     Frequency: Per Therapist Discretion     Number of Occurrences: 1 Occurrences     Scheduling Instructions:               PT    PT EVALUATE AND TREAT     Frequency: Per Therapist Discretion     Number of Occurrences: 1 Occurrences     Scheduling Instructions:               Respiratory Care    INCENTIVE SPIROMETRY - RT INSTRUCT     Frequency: ONE TIME     Number of Occurrences: 1 Occurrences    OXYGEN - NASAL CANNULA     Frequency: PRN     Number of Occurrences: Until Specified     Order Comments: Flowrate should not exeed 6L/min except [facility] facility.           OXYGEN - NASAL CANNULA PRN IF O2 SATS < 93%     Frequency: PRN     Number of Occurrences: Until Specified     Order Comments: Flowrate should not exeed 6L/min except [facility] facility.           OXYGEN NASAL CANNULA  OVERNIGHT - MAY DISCONTINUE IN AM IF SAO2 IS ABOVE 92%     Frequency: POST PROCEDURE     Number of Occurrences: 1 Occurrences     Order Comments: Titrate O2 to keep SaO2 above 92%      OXYGEN WEANING PARAMETERS     Frequency: UNTIL DISCONTINUED     Number of Occurrences: Until Specified   Discharge    DISCHARGE PATIENT     Frequency: ONE TIME     Number of Occurrences: 1 Occurrences     Order Comments: Discharge when bed is  available to rehab  Follow up with the ortho per recommendations, please ensure that follow up appointment is scheduled     Precaution    HIP PRECAUTIONS - NO FLEXION PAST 90 DEGREES, NO INTERNAL ROTATION, NO ADDUCTION OF LEG PAST NEUTRAL     Frequency: UNTIL DISCONTINUED     Number of Occurrences: Until Specified    HIP PRECAUTIONS - NO HYPEREXTENSION, NO EXTERNAL ROTATION     Frequency: UNTIL DISCONTINUED     Number of Occurrences: Until Specified   Medications    acetaminophen  (TYLENOL ) tablet     Frequency: Q4H PRN     Dose: 650 mg     Route: Oral  alcohol  62 % (NOZIN NASAL SANITIZER) nasal swab packet     Frequency: 2x/day     Dose: 1 Each     Route: Each Nostril    apixaban  (ELIQUIS ) tablet     Frequency: 2x/day     Dose: 2.5 mg     Route: Oral    aspirin  chewable tablet 81 mg     Frequency: Daily     Dose: 81 mg     Route: Oral    atorvastatin  (LIPITOR) tablet     Frequency: QPM     Dose: 40 mg     Route: Oral    benztropine  (COGENTIN ) tablet     Frequency: NIGHTLY     Dose: 1 mg     Route: Oral    bisacodyl  (DULCOLAX) rectal suppository     Frequency: Daily PRN     Dose: 10 mg     Route: Rectal    ezetimibe  (ZETIA ) tablet     Frequency: QPM     Dose: 10 mg     Route: Oral    famotidine  (PEPCID ) tablet     Frequency: 2x/day     Dose: 40 mg     Route: Oral    HYDROcodone -acetaminophen  (NORCO) 7.5 mg-325 mg per tablet     Frequency: Q4H PRN     Dose: 1 Tablet     Route: Oral    hydrocortisone  (CORTAID) 1% topical cream     Frequency: 2x/day PRN     Route: Apply Topically    losartan  (COZAAR ) tablet     Frequency: 2x/day     Dose: 50 mg     Route: Oral    metoprolol  succinate (TOPROL -XL) 24 hr extended release tablet     Frequency: Daily     Dose: 12.5 mg     Route: Oral    mirabegron  (MYRBETRIQ ) 24 hr extended release tablet     Frequency: Daily     Dose: 25 mg     Route: Oral    nicotine  (NICODERM CQ ) transdermal patch (mg/24 hr)     Frequency: Q24H     Dose: 21 mg     Route: Transdermal    ondansetron   (ZOFRAN ) 2 mg/mL injection     Frequency: Q6H PRN     Dose: 4 mg     Route: Intravenous    pantoprazole  (PROTONIX ) delayed release tablet     Frequency: NIGHTLY     Dose: 40 mg     Route: Oral    polyethylene glycol (MIRALAX ) oral packet     Frequency: Daily     Dose: 17 g     Route: Oral    ramelteon  (ROZEREM ) tablet     Frequency: NIGHTLY     Dose: 8 mg     Route: Oral    risperiDONE  (risperDAL ) tablet     Frequency: 2x/day     Dose: 0.5 mg     Route: Oral    sennosides-docusate sodium  (SENOKOT-S) 8.6-50mg  per tablet     Frequency: 2x/day PRN     Dose: 1 Tablet     Route: Oral    sertraline  (ZOLOFT ) tablet     Frequency: NIGHTLY     Dose: 100 mg     Route: Oral    tamsulosin  (FLOMAX ) capsule     Frequency: NIGHTLY     Dose: 0.4 mg     Route: Oral        Review of Systems:  Focused review of system  was completed. Refer to the HPI for ROS details.     Today's Physical Exam:  Physical Exam  Vitals reviewed.   HENT:      Head: Normocephalic and atraumatic.      Mouth/Throat:      Mouth: Mucous membranes are moist.      Pharynx: Oropharynx is clear.   Cardiovascular:      Rate and Rhythm: Regular rhythm. Bradycardia present.   Pulmonary:      Effort: Pulmonary effort is normal.      Breath sounds: Normal breath sounds.   Abdominal:      General: Bowel sounds are normal.      Palpations: Abdomen is soft.   Skin:     General: Skin is warm and dry.   Neurological:      Mental Status: He is alert. He is disoriented.          I/O:  I/O last 24 hours:    Intake/Output Summary (Last 24 hours) at 12/18/2023 1405  Last data filed at 12/17/2023 1756  Gross per 24 hour   Intake 800 ml   Output 900 ml   Net -100 ml     I/O current shift:  No intake/output data recorded.    Labs:  Reviewed: I have reviewed all lab results.  Lab Results Today:    Results for orders placed or performed during the hospital encounter of 12/12/23 (from the past 24 hours)   CBC - AM ONCE   Result Value Ref Range    WBC 9.9 3.6 - 10.2 x10^3/uL    RBC 3.56 (L)  4.06 - 5.63 x10^6/uL    HGB 11.4 (L) 12.5 - 16.3 g/dL    HCT 67.2 (L) 63.2 - 47.1 %    MCV 91.9 73.0 - 96.2 fL    MCH 32.0 23.8 - 33.4 pg    MCHC 34.8 32.5 - 36.3 g/dL    RDW 86.2 87.8 - 83.7 %    PLATELETS 244 140 - 440 x10^3/uL    MPV 8.0 7.4 - 11.4 fL   BASIC METABOLIC PANEL   Result Value Ref Range    SODIUM 140 136 - 145 mmol/L    POTASSIUM 4.9 3.5 - 5.1 mmol/L    CHLORIDE 109 (H) 98 - 107 mmol/L    CO2 TOTAL 27 21 - 31 mmol/L    ANION GAP 4 4 - 13 mmol/L    CALCIUM 9.1 8.6 - 10.3 mg/dL    GLUCOSE 888 (H) 74 - 109 mg/dL    BUN 15 7 - 25 mg/dL    CREATININE 9.14 9.39 - 1.30 mg/dL    BUN/CREA RATIO 18 6 - 22    ESTIMATED GFR 90 >59 mL/min/1.80m^2    OSMOLALITY, CALCULATED 281 270 - 290 mOsm/kg   MAGNESIUM  - AM ONCE   Result Value Ref Range    MAGNESIUM  1.9 1.9 - 2.7 mg/dL     Micro Results: No results found for any visits on 12/12/23 (from the past 24 hours).  Images:   No results found.   XR PELVIS   Final Result   Left hip arthroplasty in good alignment on this single view                    Radiologist location ID: [id number]         XR FEMUR LEFT   Final Result      There is a subcapital left hip fracture with comparable appearance on today's  CT.      No more distal left femur acute bony abnormality demonstrated      Degenerative changes in the knee. Please see report of left knee study same date.      Atherosclerotic changes noted in the femoral vessels. There are several surgical clips as described above.                      Radiologist location ID: [id number]            Radiologist location ID: [id number]         XR AP MOBILE CHEST   Final Result   No focal alveolar infiltrate is identified.         Radiologist location ID: [id number]         CT HIP LEFT WO IV CONTRAST   Final Result   LEFT FEMORAL NECK FRACTURE         One or more dose reduction techniques were used (e.g., Automated exposure control, adjustment of the mA and/or kV according to patient size, use of iterative reconstruction  technique).         Radiologist location ID: [id number]         CT BRAIN WO IV CONTRAST   Final Result   NO ACUTE FINDINGS         Radiologist location ID: [id number]         CT CERVICAL SPINE WO IV CONTRAST   Final Result   NO ACUTE CERVICAL FRACTURE               Radiologist location ID: [id number]         XR HIP LEFT W PELVIS 2-3 VIEWS   Final Result   DEFORMITY AT THE LEVEL OF THE LEFT FEMORAL NECK WITH A SUSPECTED FRACTURE. THIS SHOULD BE FURTHER EVALUATED WITH CT IMAGING.               Radiologist location ID: [id number]         XR KNEE LEFT 4 OR MORE VIEWS   Final Result   NO VISIBLE FRACTURE.  IF THERE IS ONGOING CLINICAL SUSPICION FOR FRACTURE, CONSIDER FOLLOWUP IMAGING IN 7-10 DAYS.                Radiologist location ID: [id number]              Problem List:  Active Hospital Problems   (*Primary Problem)    Diagnosis    *Hip fracture    Head injury due to trauma    Closed left hip fracture    Hyperlipidemia    GERD (gastroesophageal reflux disease)       Nutrition:    DIET REGULAR Do you want to initiate MNT Protocol? Yes    Additional clinical characteristics related to nutrition:    - monitor for weight changes   - monitor intake and output    - monitor bowel functions                    Assessment/ Plan:   Left femur fracture  - orthopedic surgery following.  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  - PT following.  Recommended discharge to skilled nursing facility/inpatient rehab on discharge.  - hydrocodone -acetaminophen  7.5 mg-325 mg q.4 hours p.r.n. pain    CAD status post CABG  - aspirin  81 mg daily  - metoprolol  succinate 12.5 mg daily  - Cozaar  50 mg b.i.d.  - atorvastatin  40 mg q.h.s.  Depression  - sertraline  100 mg q.h.s.    BPH  - tamsulosin  0.4 mg q.h.s.        Hospitalist personally evaluated and examined the patient in conjunction with the MLP and agree with the assessments, treatment plan and disposition of the patient as recorded by the [facility].     DVT/PE Prophylaxis:  SCDs/ Venodynes/Impulse boots    Discharge Planning:  Rehab placement versus home    Verneita Montclair, APRN  [facility] Hospitalist

## 2023-12-18 NOTE — Discharge Summary (Signed)
 [facility]  DISCHARGE SUMMARY    PATIENT NAME:  Corey [name], Corey [name]  MRN:  [id number]  DOB:  27-Oct-1945    ENCOUNTER DATE:  12/12/2023  INPATIENT ADMISSION DATE: 12/12/2023  DISCHARGE DATE:  12/18/2023    ATTENDING PHYSICIAN: Kayanna Mckillop Lloyd, DO  SERVICE: PRN HOSPITALIST 3  PRIMARY CARE PHYSICIAN: Greig LITTIE Mills, FNP       No lay caregiver identified.    PRIMARY DISCHARGE DIAGNOSIS: Hip fracture  Active Hospital Problems    Diagnosis Date Noted    Principal Problem: Hip fracture [S72.009A] 12/12/2023    Head injury due to trauma [S09.90XA] 12/15/2023    Closed left hip fracture [S72.002A] 12/12/2023    Hyperlipidemia [E78.5] 08/29/2021    GERD (gastroesophageal reflux disease) [K21.9] 08/29/2021      Resolved Hospital Problems   No resolved problems to display.     Active Non-Hospital Problems    Diagnosis Date Noted    Right knee pain 08/06/2023    Gynecomastia, male 03/19/2023    AAA (abdominal aortic aneurysm) 02/13/2023    Gout attack 01/01/2023    GAD (generalized anxiety disorder) 09/14/2022    Coronary artery disease of bypass graft of native heart with stable angina pectoris (CMS HCC) 03/15/2022    History of constipation 03/15/2022    Positive colorectal cancer screening using Cologuard test 03/15/2022    Skin cancer 12/21/2021    Chronic back pain 12/21/2021    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    Essential hypertension 12/21/2021    Anxiety 12/21/2021    Osteoarthritis 12/21/2021    Vitamin D  deficiency, unspecified 12/21/2021    Benign prostatic hyperplasia 12/21/2021    Current smoker 12/21/2021    Heart murmur 08/29/2021    Obstructive sleep apnea treated with continuous positive airway pressure (CPAP) 08/29/2021    Right carotid bruit 08/29/2021             Current Discharge Medication List        START taking these medications.        Details   apixaban  2.5 mg Tablet  Commonly known as: ELIQUIS    2.5 mg, Oral, 2 TIMES DAILY  Refills: 0     HYDROcodone -acetaminophen  7.5-325 mg  Tablet  Commonly known as: NORCO   1 Tablet, Oral, EVERY 6 HOURS PRN  Qty: 28 Tablet  Refills: 0     nicotine  21 mg/24 hr Patch 24 hr  Commonly known as: NICODERM CQ    21 mg, Transdermal, EVERY 24 HOURS  Refills: 0     polyethylene glycol 17 gram Powder in Packet  Commonly known as: MIRALAX    17 g, Oral, Daily  Refills: 0            CONTINUE these medications - NO CHANGES were made during your visit.        Details   aspirin  81 mg Tablet, Delayed Release (E.C.)  Commonly known as: ECOTRIN   81 mg, Daily  Refills: 0     atorvastatin  40 mg Tablet  Commonly known as: LIPITOR   40 mg, Oral, Daily  Qty: 90 Tablet  Refills: 1     benztropine  1 mg Tablet  Commonly known as: COGENTIN    1 mg, Daily  Refills: 0     cyclobenzaprine  10 mg Tablet  Commonly known as: FLEXERIL    10 mg, EVERY 8 HOURS PRN  Refills: 0     famotidine  40 mg Tablet  Commonly known as: PEPCID    40 mg, Oral, 2 TIMES DAILY  Qty: 180 Tablet  Refills: 1     losartan  50 mg Tablet  Commonly known as: COZAAR    50 mg, Oral, 2 TIMES DAILY  Qty: 180 Tablet  Refills: 1     metoprolol  succinate 25 mg Tablet Sustained Release 24 hr  Commonly known as: TOPROL -XL   12.5 mg, Oral, Daily, Wean to 1/2 tablet, take whole tablet if heart rate is greater than 100  Qty: 45 Tablet  Refills: 1     mirabegron  25 mg Tablet Sustained Release 24 hr  Commonly known as: MYRBETRIQ    25 mg, Daily  Refills: 0     NEURIVA DE-STRESS ORAL   1 Capsule, Daily  Refills: 0     nitroGLYCERIN  0.4 mg Tablet, Sublingual  Commonly known as: NITROSTAT    DISSOLVE 1 TAB UNDER TONGUE EVERY 5 MINUTES AS NEEDED FOR CHEST PAIN. DO NOT EXCEED 3 DOSES IN 15 MINUTES.  Qty: 30 Tablet  Refills: 1     omeprazole  40 mg Capsule, Delayed Release(E.C.)  Commonly known as: PRILOSEC   40 mg, Oral, EVERY MORNING  Qty: 90 Capsule  Refills: 1     sertraline  100 mg Tablet  Commonly known as: ZOLOFT    100 mg, Oral, NIGHTLY  Qty: 90 Tablet  Refills: 1     tamsulosin  0.4 mg Capsule  Commonly known as: FLOMAX    0.4 mg, Oral,  EVERY EVENING AFTER DINNER  Qty: 90 Capsule  Refills: 0     vitamin B complex Tablet   1 Tablet, Daily  Refills: 0            STOP taking these medications.      ezetimibe  10 mg Tablet  Commonly known as: ZETIA      traMADoL  50 mg Tablet  Commonly known as: ULTRAM             Discharge med list refreshed?  YES     Allergies[1]  HOSPITAL PROCEDURE(S):   No orders of the defined types were placed in this encounter.    Surgical/Procedural Cases on this Admission       Case IDs Date Procedure Surgeon Location Status    [id number] 12/13/23 LEFT HIP HEMI ARTHROPLASTY BIPOLAR USING ACCOLADE IMPLANTS Higinbotham, Nicholas, DO PRN OR MAIN Comp          REASON FOR HOSPITALIZATION AND HOSPITAL COURSE   BRIEF HPI:  See H&P for complete details.  BRIEF HOSPITAL NARRATIVE:     This is a 78 year old male patient who presented to the hospital after having a fall at home.  X-ray shows left femoral neck fracture.  CT brain showed no acute findings.  CT cervical spine no acute cervical fracture.  CT scan left hip without contrast shows left femoral neck fracture.  Orthopedic surgery was consulted.  He was taken to the OR on 12/13/2023 for left hip hemiarthroplasty.  PT/OT were consulted postoperatively.  They recommended discharge to inpatient rehab or skilled nursing facility.  Patient's insurance declined acute rehab placement.  Patient is medically stable to be discharged home with home health physical therapy.  He is to follow up postop instructions per Orthopedic surgery. He is to follow up with his primary care provider in 1 week and follow up with Orthopedic surgery in 1 week.    Physical Exam  Vitals reviewed.   HENT:      Head: Normocephalic and atraumatic.   Cardiovascular:      Rate and Rhythm: Normal rate and regular rhythm.      Pulses: Normal  pulses.      Heart sounds: Normal heart sounds.   Pulmonary:      Effort: Pulmonary effort is normal.      Breath sounds: Normal breath sounds.   Abdominal:      General: Bowel sounds are  normal.      Palpations: Abdomen is soft.   Musculoskeletal:      Comments: Left hip dressing CDI   Skin:     General: Skin is warm and dry.      Capillary Refill: Capillary refill takes less than 2 seconds.   Neurological:      Mental Status: He is alert and oriented to person, place, and time.            TRANSITION/POST DISCHARGE CARE/PENDING TESTS/REFERRALS:     Follow up PCP 1 week  Follow up postop instructions per Orthopedic surgery  Follow up Orthopedic surgery 1 week        CONDITION ON DISCHARGE:  A. Ambulation: Ambulation with assistive device  B. Self-care Ability: Complete  C. Cognitive Status Alert and Oriented x 3  D. Code status at discharge:       LINES/DRAINS/WOUNDS AT DISCHARGE:   Patient Lines/Drains/Airways Status       Active Line / Dialysis Catheter / Dialysis Graft / Drain / Airway / Wound       Name Placement date Placement time Site Days    Peripheral IV Anterior;Distal;Left;Upper Arm 12/12/23  1037  -- 6    Wound  Right;Posterior Elbow 12/12/23  2252  -- 5    Wound  Left;Posterior Elbow 12/12/23  2253  -- 5    Wound  Incision Left Hip 12/13/23  1920  -- 4                    DISCHARGE DISPOSITION:  Home discharge and Home Health  DISCHARGE INSTRUCTIONS:  Post-Discharge Follow Up Appointments       Thursday Dec 27, 2023    Hospital Follow Up with Jacolyn Greig CROME, FNP at  2:00 PM    Follow up with Jacolyn Greig CROME, FNP    Phone: [phone]    Where: [facility]      Monday Dec 31, 2023    Follow up with Stevie Netter, DO    Phone: [phone]    Where: [address], [place] [address]      Wednesday Jan 02, 2024    Return Patient Visit with Jacolyn Greig CROME, FNP at  2:30 PM      Family Medicine, [facility]  [facility] Medicine [facility], [place]   [address]  [place] [address]  [phone]             Refer to Home Health - EXTERNAL     Refer to Physical Therapy-External   Number of Visits Requested: 1      DME - BEDSIDE COMMODE    A standard commode is covered when the patient is physically incapable of utilizing regular toilet facilities.  An extra wide/heavy duty commode chair is covered for a patient who weighs 300 pounds or more.     I certify that a bedside commode is necessary due to Patient is confined to one level of home and there is no toilet on that level    Bedside Commode Type Standard Bedside Commode    Freedom of Choice: I have informed patient of their freedom of choice with respect to  DME providers      DME - WALKER Front Wheeled    Please note - If patient is 300 lbs or greater please order bariatric or heavy duty items.     Ht 170.2 cm    Wt 70.44 kg    Current Attending: Dr. Lendia    Medical Condition or Diagnosis which is primary reason for equipment: Right hip fracture with repair    Patient has mobility limits that significantly impairs ability to participate in one or more mobility related ADL's (MRADL's): Yes    Moblity Limitations: Pt at heightened risk of injury r/t attempts to fulfill MRADL's & can safely use walker which resolves issue    Walker Type: [employer]    Freedom of Choice: I have informed patient of their freedom of choice with respect to DME providers      [facility]     DME - WALKER Front Wheeled    Please note - If patient is 300 lbs or greater please order bariatric or heavy duty items.     Patient has mobility limits that significantly impairs ability to participate in one or more mobility related ADL's (MRADL's): Yes    Moblity Limitations: Pt unable to fulfill MRADL's & is able to safely use walker which resolves issue    Walker Type: [employer]    Freedom of Choice: I have informed patient of their freedom of choice with respect to DME providers      DME - BEDSIDE COMMODE    A standard commode is covered when the patient is physically incapable of utilizing regular toilet facilities.  An extra wide/heavy duty commode chair is covered for a patient who weighs  300 pounds or more.     I certify that a bedside commode is necessary due to Patient is confined to one level of home and there is no toilet on that level    Bedside Commode Type Standard Bedside Commode    Freedom of Choice: I have informed patient of their freedom of choice with respect to DME providers           Verneita Montclair, APRN    Copies sent to Care Team         Relationship Specialty Notifications Start End    Harrup, Amy LITTIE, FNP PCP - General FAMILY NURSE PRACTITIONER Yes. All results, Admissions 02/16/22     Phone: [phone] Fax: [phone]         [address] [place] [address]    Real Knoll, MD  CARDIOVASCULAR DISEASE  03/15/22     Phone: [phone] Fax: [phone]         [address] [place] [address]    Emilio Erps Faith, DO  UROLOGY  11/15/22     Phone: [phone] Fax: [phone]         [address] [place] [address]    Tobie Beauvais, MD  PULMONARY DISEASE Admissions 12/15/22     Phone: [phone] Fax: [phone]         [address] [place] [address]    Tobie Hitt, MD  GASTROENTEROLOGY Admissions 11/19/23     Phone: [phone] Fax: [phone]         [address] [place] [address]            Referring providers can utilize [email or web address] to access their referred [facility] patient's information.                               [  1]   Allergies  Allergen Reactions    Penicillins  Other Adverse Reaction (Add comment) and Rash     Childhood allergy, unknown reaction

## 2023-12-18 NOTE — PT Treatment (Signed)
 [facility] Medicine [facility]  [address]  [place], [address]  [phone]  (Fax) [phone]  Rehabilitation Department  Physical Therapy Daily Inpatient Note    Date: 12/18/2023  Patient's Name: Corey [name]  Date of Birth: 1946/02/11  Height: Height: 170.2 cm (5' 7)  Weight: Weight: 70.4 kg (155 lb 4.8 oz)      Plan: Will continue under current POC.      Discharge Disposition: (P) home with assist, home with outpatient services        Subjective/Objective/Assessment:  Flowsheet    12/18/23 1415   Rehab Session   Document Type therapy progress note (daily note)   PT Visit Date 12/18/23   General Information   Patient Profile Reviewed yes   Medical Lines Telemetry   Respiratory Status room air   Existing Precautions/Restrictions posterior hip precautions   Pre Treatment Status   Pre Treatment Patient Status Patient supine in bed   Support Present Pre Treatment  Family present   Programmer, multimedia Nurse   Communication Pre Treatment Comment cleared   Pre-Treatment Pain   Pretreatment Pain Rating 0/10 - no pain   Pre/Posttreatment Pain Comment hip   Bed Mobility   Supine-Sit Independence minimum assist (75% patient effort)   Transfer Assessment/Treatment   Sit-Stand Independence contact guard assist   Stand-Sit Independence contact guard assist   Sit-Stand-Sit, Assist Device walker, front wheeled   Transfer Impairments endurance;strength decreased   Gait Assessment/Treatment   Total Distance Ambulated 100   Independence  contact guard assist   Assistive Device  walker, front wheeled   Impairments  endurance;strength decreased   Comment no lob, working on reciprocal gait   Stairs Assessment/Treatment   Number of Stairs 4   Handrail Location both sides   Independence Level contact guard assist   Balance   Sitting Balance: Static fair + balance   Sitting, Dynamic (Balance) fair + balance   Sit-to-Stand Balance fair + balance   Standing Balance: Static fair balance    Post Treatment Status   Post Treatment Patient Status Patient supine in bed;Patient safety alarm activated   Support Present Post Treatment  Family present   Communication Post Treatement Nurse;Charge Nurse   Patient Effort good   Post-Treatment Pain   Posttreatment Pain Rating 2/10   Cognitive Assessment/Intervention   Behavior/Mood Observations behavior appropriate to situation, WNL/WFL   Physical Therapy Time and Intention   Total PT Minutes: 18   Therapy Plan Review/Discharge Plan (PT)   Anticipated Discharge Disposition home with assist;home with outpatient services     Patient transferred supine to sit with min assist with the left le, standing with cga, cues for correct hand placement during transfers, gaited with rw 127ft, no lob, did 4 steps with sba, btb with very min assist with the left le, need sin reach, bed alarmed, family present, all questions answered, reviewed hip precautions, car transfers and general safety for home, home possible today was denied [employer] per insurance          Goals:     all bed mobility activities  modified independence  bed rails    modified independence  walker, rolling  250  without LOB, gait failure  and pt exhibiting safety awareness using AD         all transfers  modified independence  walker, rolling                     Intervention minutes:  GAIT TRAINING [address] MINUTES    THERAPIST  Orie Molt, PTA  12/18/2023, 15:09

## 2023-12-18 NOTE — OT Treatment (Signed)
 [facility] Medicine [facility]  [address]  [place], [address]  [phone]  (Fax) [phone]  Rehabilitation Department     Occupational Therapy Daily Inpatient Note    Date: 12/18/2023  Patient's Name: Corey [name]  Date of Birth: Sep 08, 1945  Height: Height: 170.2 cm (5' 7)  Weight: Weight: 70.4 kg (155 lb 4.8 oz)        Plan: Will continue under current POC.         Subjective/Objective/Assessment:      12/18/23 1047   Rehab Session   Document Type therapy progress note (daily note)   OT Visit Date 12/18/23   Total OT Minutes: 24   Patient Effort good   General Information   Patient Profile Reviewed yes   Medical Lines PIV Line;Telemetry   Respiratory Status room air   Existing Precautions/Restrictions posterior hip precautions   Pre Treatment Status   Pre Treatment Patient Status Patient supine in bed;Call light within reach;Patient safety alarm activated;Nurse approved session   Support Present Pre Treatment  None   Communication Pre Treatment  Charge Nurse   Communication Pre Treatment Comment clear for OT   Vital Signs   Vitals Comment not connected   Pain Assessment   Pretreatment Pain Rating 0/10 - no pain   Posttreatment Pain Rating 4/10   Cognition   Behavior/Mood Observations behavior appropriate to situation, WNL/WFL   Transfer Assessment/Treatment   Sit-Stand Independence contact guard assist   Stand-Sit Independence contact guard assist   Sit-Stand-Sit, Assist Device walker, front wheeled   Lower Body Dressing Assessment/Training   Position sitting;standing   DRESSING ASSESSED Doff Socks;Doff Pants- pull up;Todd Rands;Todd Latch Pants- pull up   Independence Level  contact guard assist;verbal cues required   Post Treatment Status   Post Treatment Patient Status Patient sitting in bedside chair or w/c;Call light within reach;Patient safety alarm activated   Support Present Post Treatment  None   Communication Post Treatement Charge Nurse   Clinical Impression    Functional Level at Time of Session patient able to recall all hip precautions. patient completed Lb dressing with use of AE to don/doff socks  and pants using reacher and sock aid. patient completed with verbal cues and CGA. patient completed sit to stand with CGA. patient left in chair with needs whtin reach and safety alarm on.                 Intervention minutes: ADL/IADL TRAINING 24 minutes      THERAPIST  Tawni Brooks, COTA  12/18/2023, 15:40

## 2023-12-18 NOTE — Care Plan (Signed)
 Problem: Adult Inpatient Plan of Care  Goal: Plan of Care Review  Outcome: Ongoing (see interventions/notes)  Goal: Patient-Specific Goal (Individualized)  Outcome: Ongoing (see interventions/notes)  Goal: Absence of Hospital-Acquired Illness or Injury  Outcome: Ongoing (see interventions/notes)  Intervention: Identify and Manage Fall Risk  Recent Flowsheet Documentation  Taken 12/18/2023 0200 by Rexene DEL, RN  Safety Promotion/Fall Prevention:   safety round/check completed   nonskid shoes/slippers when out of bed   fall prevention program maintained  Taken 12/18/2023 0000 by Rexene DEL, RN  Safety Promotion/Fall Prevention:   safety round/check completed   nonskid shoes/slippers when out of bed   fall prevention program maintained  Taken 12/17/2023 2200 by Rexene DEL, RN  Safety Promotion/Fall Prevention:   safety round/check completed   nonskid shoes/slippers when out of bed   fall prevention program maintained  Taken 12/17/2023 2000 by Rexene DEL, RN  Safety Promotion/Fall Prevention:   safety round/check completed   nonskid shoes/slippers when out of bed   fall prevention program maintained  Intervention: Prevent Skin Injury  Recent Flowsheet Documentation  Taken 12/18/2023 0200 by Rexene DEL, RN  Body Position: other (see comments)  Taken 12/17/2023 2200 by Rexene DEL, RN  Body Position: other (see comments)  Intervention: Prevent and Manage VTE (Venous Thromboembolism) Risk  Recent Flowsheet Documentation  Taken 12/17/2023 2000 by Rexene DEL, RN  VTE Prevention/Management:   ambulation promoted   anticoagulant therapy maintained  Intervention: Prevent Infection  Recent Flowsheet Documentation  Taken 12/18/2023 0200 by Rexene DEL, RN  Infection Prevention:   single patient room provided   rest/sleep promoted   promote handwashing  Taken 12/18/2023 0000 by Rexene DEL, RN  Infection Prevention:   single patient room provided   rest/sleep promoted   promote handwashing  Taken 12/17/2023 2200 by Rexene DEL, RN  Infection Prevention:   single patient room provided    rest/sleep promoted   promote handwashing  Taken 12/17/2023 2000 by Rexene DEL, RN  Infection Prevention:   single patient room provided   rest/sleep promoted   promote handwashing  Goal: Optimal Comfort and Wellbeing  Outcome: Ongoing (see interventions/notes)  Goal: Rounds/Family Conference  Outcome: Ongoing (see interventions/notes)     Problem: Orthopaedic Fracture  Goal: Absence of Bleeding  Outcome: Ongoing (see interventions/notes)  Goal: Bowel Elimination  Outcome: Ongoing (see interventions/notes)  Goal: Absence of Embolism Signs and Symptoms  Outcome: Ongoing (see interventions/notes)  Intervention: Prevent or Manage Embolism Risk  Recent Flowsheet Documentation  Taken 12/17/2023 2000 by Rexene DEL, RN  VTE Prevention/Management:   ambulation promoted   anticoagulant therapy maintained  Goal: Fracture Stability  Outcome: Ongoing (see interventions/notes)  Goal: Optimal Functional Ability  Outcome: Ongoing (see interventions/notes)  Goal: Absence of Infection Signs and Symptoms  Outcome: Ongoing (see interventions/notes)  Goal: Effective Tissue Perfusion  Outcome: Ongoing (see interventions/notes)  Goal: Optimal Pain Control and Function  Outcome: Ongoing (see interventions/notes)  Goal: Effective Oxygenation and Ventilation  Outcome: Ongoing (see interventions/notes)     Problem: Fall Injury Risk  Goal: Absence of Fall and Fall-Related Injury  Outcome: Ongoing (see interventions/notes)  Intervention: Promote Injury-Free Environment  Recent Flowsheet Documentation  Taken 12/18/2023 0200 by Rexene DEL, RN  Safety Promotion/Fall Prevention:   safety round/check completed   nonskid shoes/slippers when out of bed   fall prevention program maintained  Taken 12/18/2023 0000 by Rexene DEL, RN  Safety Promotion/Fall Prevention:   safety round/check completed   nonskid shoes/slippers when out of bed   fall prevention program  maintained  Taken 12/17/2023 2200 by Rexene DEL, RN  Safety Promotion/Fall Prevention:   safety round/check completed    nonskid shoes/slippers when out of bed   fall prevention program maintained  Taken 12/17/2023 2000 by Rexene DEL, RN  Safety Promotion/Fall Prevention:   safety round/check completed   nonskid shoes/slippers when out of bed   fall prevention program maintained     Problem: Skin Injury Risk Increased  Goal: Skin Health and Integrity  Outcome: Ongoing (see interventions/notes)  Intervention: Optimize Skin Protection  Recent Flowsheet Documentation  Taken 12/17/2023 2000 by Rexene DEL, RN  Pressure Reduction Techniques: Frequent weight shifting encouraged     Problem: Wound  Goal: Optimal Coping  Outcome: Ongoing (see interventions/notes)  Goal: Optimal Functional Ability  Outcome: Ongoing (see interventions/notes)  Goal: Absence of Infection Signs and Symptoms  Outcome: Ongoing (see interventions/notes)  Goal: Improved Oral Intake  Outcome: Ongoing (see interventions/notes)  Goal: Optimal Pain Control and Function  Outcome: Ongoing (see interventions/notes)  Goal: Skin Health and Integrity  Outcome: Ongoing (see interventions/notes)  Intervention: Optimize Skin Protection  Recent Flowsheet Documentation  Taken 12/17/2023 2000 by Rexene DEL, RN  Pressure Reduction Techniques: Frequent weight shifting encouraged  Goal: Optimal Wound Healing  Outcome: Ongoing (see interventions/notes)  Intervention: Promote Wound Healing  Recent Flowsheet Documentation  Taken 12/17/2023 2000 by Rexene DEL, RN  Pressure Reduction Techniques: Frequent weight shifting encouraged

## 2023-12-18 NOTE — PT Treatment (Signed)
 [facility]  [address]  [place], [address]  [phone]  (Fax) [phone]  Rehabilitation Department  Physical Therapy Daily Inpatient Note    Date: 12/18/2023  Patient's Name: Corey [name]  Date of Birth: 02/16/1946  Height: Height: 170.2 cm (5' 7)  Weight: Weight: 70.4 kg (155 lb 4.8 oz)      Plan: Will continue under current POC.      Discharge Disposition: (P) inpatient rehabilitation facility, skilled nursing facility        Subjective/Objective/Assessment:  Flowsheet    12/18/23 0936   Rehab Session   Document Type therapy progress note (daily note)   PT Visit Date 12/18/23   General Information   Patient Profile Reviewed yes   Medical Lines Telemetry   Respiratory Status room air   Existing Precautions/Restrictions posterior hip precautions   Pre Treatment Status   Pre Treatment Patient Status Patient supine in bed   Support Present Pre Treatment  None   Communication Pre Treatment  Charge Nurse   Communication Pre Treatment Comment cleared   Pre-Treatment Pain   Pretreatment Pain Rating 0/10 - no pain   Bed Mobility   Supine-Sit Independence minimum assist (75% patient effort)   Bed Mobility, Assistive Device bed rails;draw sheet   Transfer Assessment/Treatment   Sit-Stand Independence contact guard assist   Stand-Sit Independence contact guard assist   Sit-Stand-Sit, Assist Device walker, front wheeled   Transfer Impairments endurance;pain;strength decreased   Gait Assessment/Treatment   Total Distance Ambulated 70   Independence  contact guard assist   Assistive Device  walker, front wheeled   Deviations  narrow BOS   Impairments  pain;endurance;strength decreased   Comment cues to stand erect, couple standing rests   Balance   Sitting Balance: Static fair + balance   Sitting, Dynamic (Balance) fair + balance   Sit-to-Stand Balance fair + balance   Standing Balance: Static fair balance   Post Treatment Status   Post Treatment Patient Status Patient  sitting in bedside chair or w/c;Patient safety alarm activated   Communication Post Treatement Charge Nurse   Patient Effort good   Post-Treatment Pain   Posttreatment Pain Rating 4/10   Cognitive Assessment/Intervention   Behavior/Mood Observations behavior appropriate to situation, WNL/WFL   Physical Therapy Time and Intention   Total PT Minutes: 12   Therapy Plan Review/Discharge Plan (PT)   Anticipated Discharge Disposition inpatient rehabilitation facility;skilled nursing facility     Patient transferred supine to sit with min of 1, standing with cga, gaited with rw 54ft, narrow base of support, couple standing rests, recalled 2 hip precaution, (still crossing feet frequently), left up in the chair with need sin reach, chair alarmed          Goals:     all bed mobility activities  modified independence  bed rails    modified independence  walker, rolling  250  without LOB, gait failure  and pt exhibiting safety awareness using AD         all transfers  modified independence  walker, rolling                     Intervention minutes: GAIT TRAINING 12 MINUTES    THERAPIST  Orie Molt, PTA  12/18/2023, 12:07

## 2023-12-18 NOTE — Nurses Notes (Signed)
 Discharge instructions provided to patient and wife. All questions answered. Provided with rx for Norco. All other medications called in to [facility]. IV removed intact. Pt to be taken via wheelchair with all belongings to family car by staff.

## 2023-12-19 ENCOUNTER — Telehealth (HOSPITAL_COMMUNITY): Payer: Self-pay | Admitting: Family

## 2023-12-19 ENCOUNTER — Ambulatory Visit (RURAL_HEALTH_CENTER): Payer: Self-pay | Admitting: Family

## 2023-12-19 NOTE — Telephone Encounter (Signed)
 Transition of Care Contact Information  Discharge Date: 12/18/2023  Transition Facility Type--Hospital (Inpatient or Observation)  Facility Name--[facility]  Interactive Contact(s): Completed or attempted contact indicated by Date/Time  First Attempt Call: 12/19/2023 10:27 AM  Second Attempted Contact: 12/19/2023 10:29 AM  Contact Method(s)-- Patient/Caregiver Telephone, MyChart Patient Portal  Clinical Staff Name/Role who contacted--Kirra Verga RONAL Rouleau, BSN, RN

## 2023-12-20 ENCOUNTER — Ambulatory Visit (HOSPITAL_COMMUNITY): Payer: Self-pay

## 2023-12-21 ENCOUNTER — Other Ambulatory Visit (HOSPITAL_COMMUNITY): Payer: Self-pay | Admitting: Orthopaedic Surgery

## 2023-12-21 ENCOUNTER — Ambulatory Visit (HOSPITAL_COMMUNITY)
Admission: RE | Admit: 2023-12-21 | Discharge: 2023-12-21 | Disposition: A | Discharge status: Still In Hospital | Payer: Self-pay | Source: Ambulatory Visit | Attending: Orthopaedic Surgery | Admitting: Orthopaedic Surgery

## 2023-12-21 ENCOUNTER — Encounter (HOSPITAL_COMMUNITY): Payer: Self-pay

## 2023-12-21 ENCOUNTER — Other Ambulatory Visit: Payer: Self-pay

## 2023-12-21 DIAGNOSIS — S72002D Fracture of unspecified part of neck of left femur, subsequent encounter for closed fracture with routine healing: Secondary | ICD-10-CM | POA: Insufficient documentation

## 2023-12-21 DIAGNOSIS — S72002A Fracture of unspecified part of neck of left femur, initial encounter for closed fracture: Secondary | ICD-10-CM | POA: Insufficient documentation

## 2023-12-21 NOTE — PT Evaluation (Addendum)
 [facility]  Outpatient Physical Therapy  [address]  [place], [address]  ([phone]  (Fax) [phone]      Physical Therapy Lower Extremity Evaluation    Date: 12/21/2023  Patient's Name: Corey [name]  Date of Birth: 07/17/1945  Physical Therapy Evaluation     Evaluating Physical Therapist: Damien Parcel, PT, DPT  PT diagnosis/Reason for Referral: closed L hip fracture  Next Scheduled Physician Appointment: 12/27/2023  Allergies/Contraindications: anterior, posterior, and lateral hip precautions until 8/14; fall risk, CABG x3 2023, back surgery, AAA, L knee flexion limited to 60 deg and it buckles occasionally               SUBJECTIVE  Date of onset: 12/12/2023    Date of Surgery 12/13/2023 (LEFT HIP HEMI ARTHROPLASTY BIPOLAR USING ACCOLADE IMPLANTS)    Mechanism of injury: fall in bedroom (trip on rug)    Current Presentation: Pt presents 1 week s/p L hip hemiarthroplasty which was performed due to a fall resulting in L femoral neck subcapital fracture    PLOF: IND    Previous episodes/treatments: none    Medications for this problem: pain medication    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    [facility]  Dr Skipper    Hypercholesterolemia     Hypertension     IBS (irritable bowel syndrome)     Left foot pain 12/21/2021    Leukocytosis     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    Narcolepsy     Neck pain 12/21/2021    Nephritis 12/21/2021    OSA (obstructive sleep apnea)     Osteoarthritis     Paresthesia 12/21/2021    Previous back surgery  12/21/2021    Right carotid bruit     Right hip pain 12/21/2021    Thigh pain 12/21/2021    Thyroid nodule     Weight loss, non-intentional 12/21/2021         Past Surgical History:   Past Surgical History:   Procedure Laterality Date    CARDIAC CATHETERIZATION  07/13/2021    [id number]    Dr Garnette Ward    DENTAL SURGERY      HX BACK SURGERY      HX COLONOSCOPY  2019    had done at [facility]    HX CORONARY ARTERY BYPASS GRAFT  08/03/2021    Dr Skipper [facility]    HX HERNIA REPAIR      HX LAP CHOLECYSTECTOMY      ORTHOPEDIC SURGERY      PROSTATE SURGERY  10/2022    Dr. Emilio         Patient goals: REDUCE PAIN and NORMALIZE FUNCTION    Occupation: retired    Pain location: L lateral hip, L groin  Pain description: SHARP, STABBING, and PRESSURE    Pain frequency:  CONTINUOUS    Pain rating: Now 4   Best 3   Worst 10    Radiculopathy: lateral L thigh    Pain increases with: POSITION CHANGE, ADLs, ACTIVITY, EXERCISE, STAND, SIT, WALK, and STAIR CLIMBING           decreases with : COLD and REST    Sensation: intact to LT    Weakness: yes L LE    Sleep affected: yes unable to sleep    Subjective Functional Reports:    Sitting: LIMITED    Standing: LIMITED    Walking: LIMITED    Lifting: LIMITED    Personal ADLs: dressing, transferring, walking, navigating stairs    Patient-Specific Functional Score:    Problem Score   1. golfing 0   2. Walking across grass 0   3. Navigating stairs 3   Total 1   Total score = sum of the activity scores/number of activities    Minimal detectable change (90% CI) for avg score = 2 points    Minimal detectable change (90% CI) for single activity score = 3 points             OBJECTIVE    PROM   left   Hip Flexion 70 deg*   Hip Extension Lacking 20 deg from neutral*   Hip Abduction 35 deg   Hip Adduction 0   Hip IR NT   Hip ER 20 deg*   Knee Flexion 60 deg*     ROM comments tightness noted in L hip flexors, HS, Piriformis    Strength (Manual Muscle Testing per [employer] Muscle Grading system)      left   Hip Flexion 3-   Hip Extension 3-   Hip Abduction 3-   Hip Adduction 3-   Hip IR 3-   Hip ER 3-   Knee Flexion 4-   Knee Extension 4-     Strength comments unable to perform SLR    Gait: USES ASSISTIVE DEVICE and RECIPROCATING STEPS WITH ASYMMETRIC STRIDE LENGTH    Palpation: TTP L ITB    Joint mobility:hypomobility L FAI;     Incision: staples intact, no drainage noted, no bandage in place    Posture:FEMORAL LATERAL ROTATION    TUG 20 sec with 2WW and pain      Treatment provided:REVIEW OF POC AND GOALS WITH PATIENT, ALL QUESTIONS ANSWERED, PATIENT EDUCATION, and THERAPEUTIC EXERCISE   HEP/Access Code: Access Code: [email or web address]  URL: [email or web address]  Date: 12/21/2023  Prepared by: Damien Parcel    Exercises  - Bent Knee Fallouts  - 1 x daily - 7 x weekly - 2 sets - 10 reps  - Hip Flexion  - 1 x daily - 7 x weekly - 2 sets - 10 reps  - Supine Bridge  - 1 x daily - 7 x weekly - 2 sets - 10 reps  - Mini Squat with Counter Support  - 1 x daily - 7 x weekly - 2 sets - 10 reps  - Standing Hip Abduction with Counter Support  - 1 x daily - 7 x weekly - 2 sets - 10 reps  - Heel Raises with Counter Support  - 1 x daily - 7 x weekly - 2 sets - 10 reps  - Standing March with Counter Support  - 1 x daily - 7 x weekly - 2 sets - 20 reps    EXERCISE/ACTIVITY NAME REPETITIONS  RESISTANCE COMPLETED THIS DOS   L hip PROM (limit flexion to 90 deg, no IR) 10'  y   Manual- L ITB MFR  2' gentle y   L HS stretch 2'  y   BKFO 10x Legs on foam wedge y   Hip flexion AAROM with strap 3x  Y-pain   Supine mini bridge 10x Legs on foam wedge    y   Education on hip precautions 5'  y   NuStep (super small range due to knee)   n   Mini squats   With UE support n   Standing hip abd (stand on R)  With UE support n   Heel raises  With UE support n   Standing march  With UE support n             ASSESSMENT    Impression: Pt presents 8 days s/p L hip hemiarthroplasty with L hip pain, decreased L hip strength and ROM, poor balance, and  compensatory gait. He is at high risk for falls given recent fall history and TUG time >14 sec. Aforementioned impairments are interfering with his ability to stand, walk, squat, navigate stairs, navigate uneven terrain, dress IND, and golf. He will benefit form skilled PT services to improve post-operative L hip function and reduce pain in order to optimize his participation in ADLs and recreational activities.     Rehab potential: GOOD      Short-Term Goals: 2 Weeks   -  Patient will demonstrate improved L hip PROM to at least 0 to 90 degrees (ext-flex) to aid in ability to ambulate.     -  Patient will demonstrate independence with progressive HEP to maximize gains from physical therapy.     -  Patient will report max 5/10 pain to aid in completion of ADLs.     -  Patient will demonstrate improved strength of L LE WFL for supine SLR to aid in functional transfers.     -  Patient will wean to least restrictive assistive device with minimal to no gait deviation to aid community ambulation.     Long-Term Goals: 4 Weeks     -  Patient will demonstrate improved L hip strength of at least 4/5 to aid in functional transfers.     -  Patient will demonstrate improved TUG time of maximum 14 seconds with least restrictive assistive device to demonstrate decreased falls risk.     -  Patient will demonstrate 10 squats/sit <> stands with good form to aid in completion of ADLs.     -  Patient will demonstrate improved functional ability with Patient Specific Scale Score of at least 6.     -  Patient will demonstrate L hip AROM 0-90 deg (ext-flex) without pain to improve his ability to navigate stairs.       PLAN  Patient will attend 2 times per week x 4 weeks. Therapy may include, but is not limited to THERAPEUTIC EXERCISES, MYOFASCIAL/JOINT MOBILIZATION, POSTURE/BODY MECHANICS, ERGONOMIC TRAINING, TRANSFER/GAIT TRAINING, HOME INSTRUCTIONS, HEAT/COLD, ELECTRICAL STIMULATION, KINESIOTAPE, and DRY NEEDLING       Evaluation  complexity:   Personal factors impacting POC: L knee dysfunction   Co-morbidities impacting POC: none  Complexity of physical exam: INCLUDING MUSCULOSKELETAL SYSTEM (POSTURE, ROM, STRENGTH, HEIGHT/WEIGHT) and INCLUDING ACTIVITY/MOBILITY RESTRICTIONS   Clinical Presentation: STABLE   Evaluation Complexity: LOW-HISTORY 0, EXAMINATION 1-2, STABLE PRESENTATION      Total Session Time 50, Timed code minutes 25, and Untimed code  minutes 25         Intervention minutes: EVALUATION 25 minutes and THERAPEUTIC EXERCISE 25 minutes    Damien Parcel, PT  12/21/2023, 14:14

## 2023-12-26 ENCOUNTER — Ambulatory Visit (HOSPITAL_COMMUNITY)
Admission: RE | Admit: 2023-12-26 | Discharge: 2023-12-26 | Disposition: A | Discharge status: Still In Hospital | Payer: Self-pay | Source: Ambulatory Visit

## 2023-12-26 ENCOUNTER — Other Ambulatory Visit: Payer: Self-pay

## 2023-12-26 NOTE — PT Treatment (Signed)
 [facility]  Outpatient Physical Therapy  [address]  [place], [address]  [phone]  (Fax) [phone]    Physical Therapy Treatment Note    Date: 12/26/2023  Patient's Name: Corey [name]  Date of Birth: 1946-05-18  Physical Therapy Visit    Visit #/POC: 2/8  Authorization: 14 through 06/17/24  POC Signed?: yes  POC Ends: 01/18/2024  Order Ends: open  Next Progress Note Due: visit 8    Evaluating Physical Therapist: Damien Parcel, PT, DPT  PT diagnosis/Reason for Referral: closed L hip fracture  Next Scheduled Physician Appointment: 12/27/2023  Allergies/Contraindications: anterior, posterior, and lateral hip precautions until 8/14; fall risk, CABG x3 2023, back surgery, AAA, L knee flexion limited to 60 deg and it buckles occasionally      Subjective: Pt was sore for 1 day after IE.     Objective: TE/FS      EXERCISE/ACTIVITY NAME REPETITIONS RESISTANCE COMPLETED THIS DOS   L hip PROM (limit flexion to 90 deg, no IR) 10'   y         L HS stretch 2'   y   BKFO 10x Legs on foam wedge y   Hip flexion AAROM with strap 3x   N-pain   Supine mini bridge 10x Legs on foam wedge    y   Education on hip precautions 5'   n   NuStep (super small range due to knee)  5'   y   Mini squats   2 x 10 With UE support y   Standing hip abd (stand on R)  2 x 10 With UE support y   Heel raises  2 x 10 With UE support y   Standing march  2 x 10 With UE support y           DISCONTINUED ACTIVITIES          Manual- L ITB MFR  2' gentle n       Assessment: Pt tolerates exercise progression well today. He did have mild discomfort with standing activities, but he was able to navigate 3 FOS with B HR and SPV today after treatment.       Short-Term Goals: 2 Weeks   -  Patient will demonstrate improved L hip PROM to at least 0 to 90 degrees (ext-flex) to aid in ability to ambulate.                -  Patient will demonstrate independence with progressive HEP to maximize gains from physical therapy.                 -  Patient will report max 5/10 pain to aid in completion of ADLs.                -  Patient will demonstrate improved strength of L LE WFL for supine SLR to aid in functional transfers.                -  Patient will wean to least restrictive assistive device with minimal to no gait deviation to aid community ambulation.     Long-Term Goals: 4 Weeks                -  Patient will demonstrate improved L hip strength of at least 4/5 to aid in functional transfers.                -  Patient will demonstrate improved  TUG time of maximum 14 seconds with least restrictive assistive device to demonstrate decreased falls risk.                -  Patient will demonstrate 10 squats/sit <> stands with good form to aid in completion of ADLs.                -  Patient will demonstrate improved functional ability with Patient Specific Scale Score of at least 6.                -  Patient will demonstrate L hip AROM 0-90 deg (ext-flex) without pain to improve his ability to navigate stairs.        Plan: assess response to treatment and progress as able    Total Session Time 25 and Timed code minutes 25  THERAPEUTIC EXERCISE 25 minutes      Damien Parcel, PT  12/26/2023, 16:53

## 2023-12-27 ENCOUNTER — Ambulatory Visit (RURAL_HEALTH_CENTER): Payer: Self-pay | Attending: Family | Admitting: Family

## 2023-12-27 ENCOUNTER — Ambulatory Visit (HOSPITAL_COMMUNITY): Payer: Self-pay

## 2023-12-27 ENCOUNTER — Encounter (RURAL_HEALTH_CENTER): Payer: Self-pay | Admitting: Family

## 2023-12-27 VITALS — BP 104/51 | HR 69 | Temp 98.2°F | Resp 17 | Ht 67.0 in | Wt 159.4 lb

## 2023-12-27 DIAGNOSIS — Z09 Encounter for follow-up examination after completed treatment for conditions other than malignant neoplasm: Secondary | ICD-10-CM | POA: Insufficient documentation

## 2023-12-27 DIAGNOSIS — Z79891 Long term (current) use of opiate analgesic: Secondary | ICD-10-CM | POA: Insufficient documentation

## 2023-12-27 DIAGNOSIS — K921 Melena: Secondary | ICD-10-CM | POA: Insufficient documentation

## 2023-12-27 DIAGNOSIS — R031 Nonspecific low blood-pressure reading: Secondary | ICD-10-CM | POA: Insufficient documentation

## 2023-12-27 DIAGNOSIS — S72002D Fracture of unspecified part of neck of left femur, subsequent encounter for closed fracture with routine healing: Secondary | ICD-10-CM | POA: Insufficient documentation

## 2023-12-27 MED ORDER — LOSARTAN 25 MG TABLET
25.0000 mg | ORAL_TABLET | Freq: Two times a day (BID) | ORAL | 0 refills | Status: DC
Start: 2023-12-27 — End: 2024-01-02

## 2023-12-27 NOTE — Care Management Notes (Signed)
Referral Information  ++++++ Placed Provider #1 ++++++  Case Manager: Yaakov Guthrie  Provider Type: Home Health  Provider Name: [facility]  Address:  [address]  [place], [place] [id number]  Contact: Ames Dura    Phone: [phone] x  Fax:   Fax: [phone]

## 2023-12-27 NOTE — Nursing Note (Signed)
 Patient here today for hospital follow-up. Patient had left hip surgery on 12/13/23.  Dr. Higgenbotom

## 2023-12-27 NOTE — Progress Notes (Signed)
 FAMILY MEDICINE, [facility]  [address]  [place] [address]  Operated by [facility]  Transitional Care Management Note    Name: Corey [name] MRN:  [id number]   Date: 12/27/2023 Age: 78 y.o.     Chief Complaint: Hospital Follow Up and Hospital Discharge Transition       SUBJECTIVE:  Corey [name] is a 78 y.o. male presenting today for follow-up after being discharged. The main problem requiring admission was fall at home resulting in hip fracture and repair.  He tells me he believes he tripped on their rug in the floor. He does not remember the event, until the 2nd day of hospitalization.   He feels like he doing well. He attending outpatient PT.  Not requiring significant pain medication.   He is taking his apixaban - he did have some blood in his stool this morning, But he tells me the stool was a tight squeeze this morning. He admits to a history of hemorrhoids.  He forgot to take his MiraLax  last night.   His BP is a little low today, and he does feel a little dizzy when standing today.   He is scheduled to see ortho.     .       OBJECTIVE:   BP (!) 104/51 (Site: Left Arm, Patient Position: Sitting, Cuff Size: Adult)   Pulse 69   Temp 36.8 C (98.2 F) (Temporal)   Resp 17   Ht 1.702 m (5' 7)   Wt 72.3 kg (159 lb 6 oz)   SpO2 95%   BMI 24.96 kg/m      Physical Exam  Vitals reviewed.   Constitutional:       Appearance: Normal appearance.      Comments: He appears more frail today.    HENT:      Head: Normocephalic and atraumatic.      Nose: Nose normal.      Mouth/Throat:      Mouth: Mucous membranes are moist.   Eyes:      General: No scleral icterus.     Conjunctiva/sclera: Conjunctivae normal.   Cardiovascular:      Rate and Rhythm: Normal rate and regular rhythm.      Pulses: Normal pulses.      Heart sounds: S1 normal and S2 normal. Murmur heard.      Systolic murmur is present with a grade of 3/6.   Pulmonary:       Effort: Pulmonary effort is normal.      Breath sounds: Normal breath sounds and air entry. No wheezing, rhonchi or rales.   Abdominal:      General: Bowel sounds are normal.      Palpations: Abdomen is soft.   Musculoskeletal:         General: No swelling.      Right lower leg: No edema.      Left lower leg: No edema.   Skin:     General: Skin is warm.      Coloration: Skin is pale (slightly paler than baseline.). Skin is not cyanotic.      Nails: There is no clubbing.      Comments: He has some bruising and abrasions noted to his arms.   Staples are in the L hip and intact. No obvious infection.   There is some swell noted to tissue around the incision.   No drainage. He does not have it covered- per patient they were  told to leave to air.    Neurological:      Mental Status: He is alert and oriented to person, place, and time. Mental status is at baseline.   Psychiatric:         Behavior: Behavior is cooperative.          Transition of Care Contact Information  Discharge date: Discharge Date: 12/18/2023  Transition Facility Type--Hospital (Inpatient or Observation)  Facility Name--[facility] Interactive Contact(s):  Completed Contact: 12/19/2023  2:48 PM  First Attempt Call: 12/19/2023 10:27 AM  Second Attempted Contact: 12/19/2023 10:29 AM  Contact Method(s)-- Patient/Caregiver Telephone  Clinical Staff Name/Role who contacted--[name] williams CCMA     Data Reviewed  Medication Reconciliation completed    Assessment and Plan    ICD-10-CM    1. Hospital discharge follow-up  Z09       2. Closed fracture of left hip requiring operative repair with routine healing, subsequent encounter  S72.002D       3. Low blood pressure, not hypotension  R03.1           Doing remarkably well from his postop hip replacement.   Blood pressure is a little low side of normal at today's visit, pulse rate is normal-will decrease his losartan  to 25 mg twice a day.  He is scheduled to follow up with me on the 23rd, will  reassess at that time.  I am going to get an H and H  to rule out any significant anemia.  Reviewed last CBC and CMP.  I have asked him not to take of the Norco-as this may cause him to be more dizzy and confused.   He can continue to take his tramadol ,which he has always tolerated well for pain.  He can take an additional acetaminophen  with the tramadol  if needed.  Educated on the synergism affect.   We discussed the importance of getting a rugs up out of the bedroom floor-to avoid future accidents..  In regards to the hip fracture itself-he will need a bone density scan in the future.  We discussed the importance of increasing the protein in his diet.  In regards to the blood in his stool this morning-he is going to monitor, if it worsens he will let me know.  We discussed the apixaban -and why he needs to be on the medication to prevent blood clots.  Also ,reinforced the importance of him taking his MiraLax  every night to prevent constipation.     Other transition actions (Optional) -: Discharge documentation was reviewed, No pending tests or treatments., Education about medication(s) provided to patient/ family/ caregiver  , Durable medical equipment ordered at discharge was obtained, and attending outpatient physical therapy.    CBC  Diff   Lab Results   Component Value Date/Time    WBC 9.9 12/18/2023 03:45 AM    HGB 11.4 (L) 12/18/2023 03:45 AM    HCT 32.7 (L) 12/18/2023 03:45 AM    PLTCNT 244 12/18/2023 03:45 AM    RBC 3.56 (L) 12/18/2023 03:45 AM    MCV 91.9 12/18/2023 03:45 AM    MCHC 34.8 12/18/2023 03:45 AM    MCH 32.0 12/18/2023 03:45 AM    RDW 13.7 12/18/2023 03:45 AM    MPV 8.0 12/18/2023 03:45 AM    Lab Results   Component Value Date/Time    PMNS 91 (H) 12/14/2023 02:56 AM    LYMPHOCYTES 6 (L) 12/14/2023 02:56 AM    EOSINOPHIL 0 (L) 12/14/2023 02:56 AM  MONOCYTES 3 (L) 12/14/2023 02:56 AM    BASOPHILS 0 12/14/2023 02:56 AM    BASOPHILS 0.00 12/14/2023 02:56 AM    PMNABS 9.30 (H) 12/14/2023 02:56 AM     LYMPHSABS 0.60 (L) 12/14/2023 02:56 AM    EOSABS 0.00 12/14/2023 02:56 AM    MONOSABS 0.30 12/14/2023 02:56 AM        COMPREHENSIVE METABOLIC PANEL  Lab Results   Component Value Date    SODIUM 140 12/18/2023    POTASSIUM 4.9 12/18/2023    CHLORIDE 109 (H) 12/18/2023    CO2 27 12/18/2023    ANIONGAP 4 12/18/2023    BUN 15 12/18/2023    CREATININE 0.85 12/18/2023    GLUCOSENF 111 (H) 12/18/2023    CALCIUM 9.1 12/18/2023    ALBUMIN 4.1 12/12/2023    TOTALPROTEIN 6.6 12/12/2023    ALKPHOS 82 12/12/2023    AST 19 12/12/2023    ALT 19 12/12/2023    GFR 90 12/18/2023       Orders Placed This Encounter    H & H    losartan  (COZAAR ) 25 mg Oral Tablet     Follow up as scheduled for routine visit on 01/02/24      Ramello Cordial L Renuka Farfan, FNP-C

## 2023-12-28 ENCOUNTER — Ambulatory Visit (HOSPITAL_COMMUNITY): Admitting: Family

## 2023-12-28 ENCOUNTER — Ambulatory Visit (RURAL_HEALTH_CENTER): Attending: Family

## 2023-12-28 ENCOUNTER — Other Ambulatory Visit: Payer: Self-pay

## 2023-12-28 ENCOUNTER — Ambulatory Visit (HOSPITAL_COMMUNITY)
Admission: RE | Admit: 2023-12-28 | Discharge: 2023-12-28 | Disposition: A | Discharge status: Still In Hospital | Payer: Self-pay | Source: Ambulatory Visit

## 2023-12-28 DIAGNOSIS — S72002D Fracture of unspecified part of neck of left femur, subsequent encounter for closed fracture with routine healing: Secondary | ICD-10-CM | POA: Insufficient documentation

## 2023-12-28 DIAGNOSIS — R031 Nonspecific low blood-pressure reading: Secondary | ICD-10-CM | POA: Insufficient documentation

## 2023-12-28 LAB — H & H
HCT: 36.9 % (ref 36.7–47.1)
HGB: 12.6 g/dL (ref 12.5–16.3)

## 2023-12-28 NOTE — PT Treatment (Signed)
 [facility]  Outpatient Physical Therapy  [address]  [place], [address]  [phone]  (Fax) [phone]    Physical Therapy Treatment Note    Date: 12/28/2023  Patient's Name: Corey [name]  Date of Birth: [birth year]  Physical Therapy Visit        Visit #/POC: 3/8  Authorization: 14 through 06/17/24  POC Signed?: yes  POC Ends: 01/18/2024  Order Ends: open  Next Progress Note Due: visit 8     Evaluating Physical Therapist: Damien Parcel, PT, DPT  PT diagnosis/Reason for Referral: closed L hip fracture  Next Scheduled Physician Appointment: 12/31/2023  Allergies/Contraindications: anterior, posterior, and lateral hip precautions until 8/14; fall risk, CABG x3 2023, back surgery, AAA, L knee flexion limited to 60 deg and it buckles occasionally         Subjective:  Pt reports doing well.  Notes was sore after last visit but not really painful.  Rates pain 4/10 today.  States he is doing exercise at home as advised.  States he is sleeping about normal.      Objective:  Activities as noted below.  Ended with CP in supine with legs on wedge      EXERCISE/ACTIVITY NAME REPETITIONS RESISTANCE COMPLETED THIS DOS   L hip PROM (limit flexion to 90 deg, no IR) 10'   y             L HS stretch 2'   y   BKFO 10x Legs on foam wedge y   Hip flexion AAROM with strap 3x   N-pain   Supine mini bridge 10x Legs on foam wedge    y   Education on hip precautions 5'   n   NuStep (super small range due to knee)  5'  level 4 y   Mini squats   2 x 10 With UE support y   Standing hip abd (stand on R)  2 x 10 With UE support y   Heel raises  2 x 10 With UE support y   Standing march  2 x 10 With UE support y            DISCONTINUED ACTIVITIES               Manual- L ITB MFR  2' gentle n         Assessment: Pt tolerated treatment well.  He does need cues to reduce compensatory mechanics especially with supine hip flexion.  Pt tends to use lumbar spine movement to compensate for limited hip  mobility.  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  Pt notes he used to wear lifted shoe but has been unable to find a company that still makes them.      Short-Term Goals: 2 Weeks   -  Patient will demonstrate improved L hip PROM to at least 0 to 90 degrees (ext-flex) to aid in ability to ambulate.                -  Patient will demonstrate independence with progressive HEP to maximize gains from physical therapy.                -  Patient will report max 5/10 pain to aid in completion of ADLs.                -  Patient will demonstrate improved strength  of L LE WFL for supine SLR to aid in functional transfers.                -  Patient will wean to least restrictive assistive device with minimal to no gait deviation to aid community ambulation.     Long-Term Goals: 4 Weeks                -  Patient will demonstrate improved L hip strength of at least 4/5 to aid in functional transfers.                -  Patient will demonstrate improved TUG time of maximum 14 seconds with least restrictive assistive device to demonstrate decreased falls risk.                -  Patient will demonstrate 10 squats/sit <> stands with good form to aid in completion of ADLs.                -  Patient will demonstrate improved functional ability with Patient Specific Scale Score of at least 6.                -  Patient will demonstrate L hip AROM 0-90 deg (ext-flex) without pain to improve his ability to navigate stairs.       Plan:  Will monitor and proceed accordingly    Total Session Time 40, Timed code minutes 30, and Untimed code minutes 10  THERAPEUTIC EXERCISE 30 minutes      Corey [name], PTA  12/28/2023, 13:59

## 2023-12-31 ENCOUNTER — Ambulatory Visit (RURAL_HEALTH_CENTER): Payer: Self-pay | Admitting: Family

## 2023-12-31 NOTE — Result Encounter Note (Signed)
 Hemoglobin hematocrit are stable.  Corey [name] we you let Corey [name] know that his lab works looks fine.  No indications of anemia.

## 2024-01-01 ENCOUNTER — Ambulatory Visit (HOSPITAL_COMMUNITY)
Admission: RE | Admit: 2024-01-01 | Discharge: 2024-01-01 | Disposition: A | Discharge status: Still In Hospital | Payer: Self-pay | Source: Ambulatory Visit

## 2024-01-01 ENCOUNTER — Other Ambulatory Visit (HOSPITAL_COMMUNITY): Payer: Self-pay | Admitting: Orthopaedic Surgery

## 2024-01-01 DIAGNOSIS — S72002A Fracture of unspecified part of neck of left femur, initial encounter for closed fracture: Secondary | ICD-10-CM

## 2024-01-01 NOTE — PT Treatment (Signed)
 [facility]  Outpatient Physical Therapy  [address]  [place], [address]  [phone]  (Fax) [phone]    Physical Therapy Treatment Note    Date: 01/01/2024  Patient's Name: Corey [name]  Date of Birth: 1945-12-03  Physical Therapy Visit    Visit #/POC: 4/8  Authorization: 14 through 06/17/24  POC Signed?: yes  POC Ends: 01/18/2024  Order Ends: open  Next Progress Note Due: visit 8     Evaluating Physical Therapist: Damien Parcel, Corey [name], DPT  Corey [name] diagnosis/Reason for Referral: closed L hip fracture  Next Scheduled Physician Appointment: 12/31/2023  Allergies/Contraindications: anterior, posterior, and lateral hip precautions until 8/14; fall risk, CABG x3 2023, back surgery, AAA, L knee flexion limited to 60 deg and it buckles occasionally    Subjective:  Corey [name] reports doing well.  Did have staples removed yesterday.  Corey [name] was advised to maintain hip precautions for now but could do about anything else.  Advised Corey [name] he can start driving again.      Objective:  Activities as noted below.       EXERCISE/ACTIVITY NAME REPETITIONS RESISTANCE COMPLETED THIS DOS   L hip PROM (limit flexion to 90 deg, no IR) 10'   y    Forward and retro walk in bars     Lateral walk in bars   5 trips         5 trips     y         Y    L HS stretch 2'   y   BKFO       DKC with ball  10x       10 Legs on foam wedge Y       y   Hip flexion AAROM with strap 3x   N-pain   Supine mini bridge 10x Legs on foam wedge    y   Education on hip precautions 5'   n   NuStep (super small range due to knee)  7'  level 4 y   Mini squats   2 x 10 With UE support y   Standing hip abd (stand on R)  2 x 10 With UE support y   Heel raises  2 x 10 With UE support y   Standing march  2 x 10 With UE support y            DISCONTINUED ACTIVITIES               Manual- L ITB MFR  2' gentle n           Assessment:  Corey [name] tolerated treatment well.  He is progressing with L hip mobility.  Still painful but mostly muscular pain on  lateral portion of hip.  Corey [name] incision is clean and healing with no sign of infection.  Staples removed yesterday     Short-Term Goals: 2 Weeks   -  Patient will demonstrate improved L hip PROM to at least 0 to 90 degrees (ext-flex) to aid in ability to ambulate.                -  Patient will demonstrate independence with progressive HEP to maximize gains from physical therapy.                -  Patient will report max 5/10 pain to aid in completion of ADLs.                -  Patient will demonstrate improved strength of L LE WFL for supine SLR to aid in functional transfers.                -  Patient will wean to least restrictive assistive device with minimal to no gait deviation to aid community ambulation.     Long-Term Goals: 4 Weeks                -  Patient will demonstrate improved L hip strength of at least 4/5 to aid in functional transfers.                -  Patient will demonstrate improved TUG time of maximum 14 seconds with least restrictive assistive device to demonstrate decreased falls risk.                -  Patient will demonstrate 10 squats/sit <> stands with good form to aid in completion of ADLs.                -  Patient will demonstrate improved functional ability with Patient Specific Scale Score of at least 6.                -  Patient will demonstrate L hip AROM 0-90 deg (ext-flex) without pain to improve his ability to navigate stairs.         Plan: Will continue and progress as tolerated     Total Session Time 35 and Timed code minutes 35  THERAPEUTIC EXERCISE 35 minutes      Basheer Molchan, PTA  01/01/2024, 13:17

## 2024-01-02 ENCOUNTER — Ambulatory Visit: Attending: Family | Admitting: Family

## 2024-01-02 ENCOUNTER — Other Ambulatory Visit: Payer: Self-pay

## 2024-01-02 ENCOUNTER — Ambulatory Visit (RURAL_HEALTH_CENTER): Payer: Self-pay | Attending: Family | Admitting: Family

## 2024-01-02 ENCOUNTER — Encounter (RURAL_HEALTH_CENTER): Payer: Self-pay | Admitting: Family

## 2024-01-02 VITALS — BP 113/66 | HR 68 | Temp 97.6°F | Resp 17 | Ht 67.0 in | Wt 158.4 lb

## 2024-01-02 DIAGNOSIS — N400 Enlarged prostate without lower urinary tract symptoms: Secondary | ICD-10-CM | POA: Insufficient documentation

## 2024-01-02 DIAGNOSIS — S72002D Fracture of unspecified part of neck of left femur, subsequent encounter for closed fracture with routine healing: Secondary | ICD-10-CM | POA: Insufficient documentation

## 2024-01-02 DIAGNOSIS — Z951 Presence of aortocoronary bypass graft: Secondary | ICD-10-CM | POA: Insufficient documentation

## 2024-01-02 DIAGNOSIS — I10 Essential (primary) hypertension: Secondary | ICD-10-CM | POA: Insufficient documentation

## 2024-01-02 DIAGNOSIS — R011 Cardiac murmur, unspecified: Secondary | ICD-10-CM | POA: Insufficient documentation

## 2024-01-02 DIAGNOSIS — G4733 Obstructive sleep apnea (adult) (pediatric): Secondary | ICD-10-CM | POA: Insufficient documentation

## 2024-01-02 DIAGNOSIS — E559 Vitamin D deficiency, unspecified: Secondary | ICD-10-CM | POA: Insufficient documentation

## 2024-01-02 DIAGNOSIS — J438 Other emphysema: Secondary | ICD-10-CM | POA: Insufficient documentation

## 2024-01-02 DIAGNOSIS — Z79899 Other long term (current) drug therapy: Secondary | ICD-10-CM | POA: Insufficient documentation

## 2024-01-02 DIAGNOSIS — K219 Gastro-esophageal reflux disease without esophagitis: Secondary | ICD-10-CM | POA: Insufficient documentation

## 2024-01-02 DIAGNOSIS — N62 Hypertrophy of breast: Secondary | ICD-10-CM | POA: Insufficient documentation

## 2024-01-02 DIAGNOSIS — M1990 Unspecified osteoarthritis, unspecified site: Secondary | ICD-10-CM | POA: Insufficient documentation

## 2024-01-02 DIAGNOSIS — F411 Generalized anxiety disorder: Secondary | ICD-10-CM | POA: Insufficient documentation

## 2024-01-02 DIAGNOSIS — F17210 Nicotine dependence, cigarettes, uncomplicated: Secondary | ICD-10-CM | POA: Insufficient documentation

## 2024-01-02 DIAGNOSIS — E785 Hyperlipidemia, unspecified: Secondary | ICD-10-CM | POA: Insufficient documentation

## 2024-01-02 DIAGNOSIS — F32A Depression, unspecified: Secondary | ICD-10-CM | POA: Insufficient documentation

## 2024-01-02 MED ORDER — METOPROLOL SUCCINATE ER 25 MG TABLET,EXTENDED RELEASE 24 HR
12.5000 mg | ORAL_TABLET | Freq: Every day | ORAL | 1 refills | Status: DC
Start: 2024-01-02 — End: 2024-04-02

## 2024-01-02 MED ORDER — TAMSULOSIN 0.4 MG CAPSULE: 0.4000 mg | ORAL_CAPSULE | Freq: Every evening | ORAL | 0 refills | Status: DC

## 2024-01-02 MED ORDER — LOSARTAN 25 MG TABLET
25.0000 mg | ORAL_TABLET | Freq: Two times a day (BID) | ORAL | 0 refills | Status: DC
Start: 2024-01-02 — End: 2024-04-02

## 2024-01-02 MED ORDER — SERTRALINE 100 MG TABLET
100.0000 mg | ORAL_TABLET | Freq: Every evening | ORAL | 1 refills | Status: DC
Start: 2024-01-02 — End: 2024-04-02

## 2024-01-02 MED ORDER — ATORVASTATIN 40 MG TABLET
40.0000 mg | ORAL_TABLET | Freq: Every day | ORAL | 1 refills | Status: DC
Start: 2024-01-02 — End: 2024-04-02

## 2024-01-02 MED ORDER — FAMOTIDINE 40 MG TABLET
40.0000 mg | ORAL_TABLET | Freq: Two times a day (BID) | ORAL | 1 refills | Status: DC
Start: 2024-01-02 — End: 2024-04-02

## 2024-01-02 MED ORDER — EZETIMIBE 10 MG TABLET
10.0000 mg | ORAL_TABLET | Freq: Every evening | ORAL | 0 refills | Status: DC
Start: 2024-01-02 — End: 2024-04-02

## 2024-01-02 NOTE — Nursing Note (Signed)
 Patient here today for 3 month follow-up and medication refills.

## 2024-01-02 NOTE — Patient Instructions (Signed)
 VISIT SUMMARY:  You had had a routine follow-up visit today  and to check on your recovery from a recent hip fracture. We discussed your current medications, overall health, and steps to improve your recovery and general well-being.    YOUR PLAN:  -HIP FRACTURE: A hip fracture is a break in the upper part of your thigh bone. To aid your recovery, we will focus on improving your balance and strength to prevent future fractures. We will order a bone density scan and check your vitamin D  levels. Physical therapy and balance exercises are encouraged, along with weight-bearing exercises to improve bone density and muscle strength around your hip.    -HYPERTENSION: Hypertension is high blood pressure. Your condition is well-controlled with your current medications. Continue taking losartan  25 mg twice a day and metoprolol  12.5 mg as prescribed.    -HYPERLIPIDEMIA: Hyperlipidemia is having high levels of fats in your blood. Your condition is managed with atorvastatin  and Zetia . Continue taking these medications as prescribed.    -GASTROESOPHAGEAL REFLUX DISEASE (GERD): GERD is a condition where stomach acid frequently flows back into the tube connecting your mouth and stomach. We will discontinue omeprazole  due to potential long-term side effects. Continue taking Pepcid  twice a day and avoid spicy foods to manage your symptoms.    -OBSTRUCTIVE SLEEP APNEA: Obstructive sleep apnea is a condition where your breathing stops and starts during sleep. Continue using your CPAP machine as you have been.    -BENIGN PROSTATIC HYPERPLASIA (BPH): BPH is an enlarged prostate gland. Your condition is managed with tamsulosin  and miragebron. Continue taking this medications as prescribed.    -DEPRESSION AND ANXIETY: Your depression and anxiety are being managed with sertraline , and your condition is stable. Continue taking sertraline  as prescribed.    -SMOKING CESSATION: You continue to smoke and are not using nicotine  replacement  therapy. Consider discussing options for quitting smoking in the future.    -GENERAL HEALTH MAINTENANCE: Routine health maintenance is important. We will focus on bone health and physical activity. Routine labs including CBC, comprehensive metabolic panel, lipid panel, and vitamin D  level will be ordered.    INSTRUCTIONS:  Please follow up with the recommended bone density scan and routine lab tests. Continue with physical therapy and balance exercises as discussed. If you have any new symptoms or concerns, please contact our office.

## 2024-01-02 NOTE — Progress Notes (Signed)
 FAMILY MEDICINE, [facility] FAMILY MEDICINE [facility]  [address]  [place] [address]  Operated by [facility]     Name: Corey [name] MRN:  [id number]   Date of Birth: [birth year] Age: 78 y.o.   Date: 01/02/2024  Time: 16:44     Provider: Greig LITTIE Mills, FNP    Reason for visit: Follow Up        History of Present Illness:   History of Present Illness  Corey [name] is a 78 year old male who presents for a routine visit, medication refills and follow up after a recent hip fracture.    He is recovering from a recent hip fracture and surgery. He is not ready to return to the golf course and is working on building his stamina. He is able to drive but remains cautious about physical activities.    He is taking losartan  25 mg twice a day for blood pressure management. He is also on metoprolol  and atorvastatin . No chest pain or stroke-like symptoms.    He uses a CPAP machine for sleep apnea most of the time.  He is taking  Pepcid  twice a day for indigestion, along with omeprazole  40 mg in the morning. He is taking tamsulosin  for prostate health and reports no issues with urination or prostate-related symptoms. No recent visits to urology.    He is on sertraline  for anxiety and depression.   Previous issues with gynecomastia have resolved, since we stopped the amlodipine .     He smokes and is not currently using nicotine  patches. He recalls receiving them in the hospital but has not continued his use.    He underwent a lung cancer screening in May, which was negative. He is not currently taking vitamin D  supplements and is unsure if he needs them. No issues with bowel movements and does not require a refill on tramadol  for pain management.       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     CT LUNG SCREENING LDCT - There is paraseptal emphysematous change at the lung apices which is considered mild (Imaging)      Degenerative disease of nervous system, unspecified 04/14/2022     CT BRAIN WO IV CONTRAST - CSF Spaces Mild generalized cerebral atrophy (Imaging)      Coronary arteriosclerosis 03/15/2022    History of constipation 03/15/2022    Positive colorectal cancer screening using Cologuard test 03/15/2022    Skin cancer 12/21/2021    Low back pain 12/21/2021    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    Hypertensive disorder 12/21/2021    Anxiety 12/21/2021    Osteoarthritis 12/21/2021    Vitamin D  deficiency, unspecified 12/21/2021    Benign prostatic hyperplasia 12/21/2021    Current smoker 12/21/2021    Hyperlipidemia 08/29/2021    Heart murmur 08/29/2021    Obstructive sleep apnea syndrome 08/29/2021    Right carotid  bruit 08/29/2021    Gastroesophageal reflux disease 08/29/2021    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     [facility]  Dr Skipper      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       Historical Data    Past Medical History:  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    [place]  Dr Skipper    Hypercholesterolemia     Hypertension     IBS (irritable bowel syndrome)     Left foot pain 12/21/2021    Leukocytosis     Low  testosterone  in male 12/21/2021    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    Narcolepsy     Neck pain 12/21/2021    Nephritis 12/21/2021    OSA (obstructive sleep apnea)     Osteoarthritis     Paresthesia 12/21/2021    Previous back surgery 12/21/2021    Right carotid bruit     Right hip pain 12/21/2021    Thigh pain 12/21/2021    Thyroid nodule     Weight loss, non-intentional 12/21/2021     Past Surgical History:  Past Surgical History:   Procedure Laterality Date    CARDIAC CATHETERIZATION  07/13/2021    [id number]    Dr Garnette Ward    DENTAL SURGERY      HX BACK SURGERY      HX COLONOSCOPY  2019    had done at [facility]    HX CORONARY ARTERY BYPASS GRAFT  08/03/2021    Dr Skipper [facility]    HX HERNIA REPAIR      HX LAP CHOLECYSTECTOMY      ORTHOPEDIC SURGERY      PROSTATE SURGERY  10/2022    Dr. Emilio     Allergies:  Allergies[1]  Medications:  Current Outpatient Medications   Medication Sig    apixaban  (ELIQUIS ) 2.5 mg Oral Tablet Take 1 Tablet (2.5 mg total) by mouth Twice daily for 55 doses    aspirin  (ECOTRIN) 81 mg Oral Tablet, Delayed Release (E.C.) Take 1 Tablet (81 mg total) by mouth Daily    atorvastatin  (LIPITOR) 40 mg Oral Tablet Take 1 Tablet (40 mg total) by mouth Daily for 180 days    benztropine  (COGENTIN ) 1 mg Oral Tablet Take 1 Tablet (1 mg total) by mouth Daily    coffee/theanine/superoxide dis (NEURIVA DE-STRESS ORAL) Take 1 Capsule by mouth Daily    cyclobenzaprine  (FLEXERIL ) 10 mg Oral Tablet Take 1 Tablet (10 mg total) by mouth Every 8 hours as needed    ezetimibe  (ZETIA ) 10 mg Oral Tablet Take 1 Tablet (10 mg total) by mouth Every evening Indications: High Trigylcerides.    famotidine  (PEPCID ) 40 mg Oral Tablet Take 1 Tablet (40 mg total) by mouth Twice daily    losartan  (COZAAR ) 25 mg Oral Tablet Take 1 Tablet (25 mg total) by mouth Twice daily for 90 days Indications: high blood pressure    metoprolol  succinate (  TOPROL -XL) 25 mg Oral Tablet Sustained Release 24 hr Take 0.5 Tablets (12.5 mg  total) by mouth Daily for 180 days Wean to 1/2 tablet, take whole tablet if heart rate is greater than 100    mirabegron  (MYRBETRIQ ) 25 mg Oral Tablet Sustained Release 24 hr Take 1 Tablet (25 mg total) by mouth Daily    nitroGLYCERIN  (NITROSTAT ) 0.4 mg Sublingual Tablet, Sublingual DISSOLVE 1 TAB UNDER TONGUE EVERY 5 MINUTES AS NEEDED FOR CHEST PAIN. DO NOT EXCEED 3 DOSES IN 15 MINUTES.    omeprazole  (PRILOSEC) 40 mg Oral Capsule, Delayed Release(E.C.) Take 1 Capsule (40 mg total) by mouth Every morning for 180 days Indications: gastroesophageal reflux disease    polyethylene glycol (MIRALAX ) 17 gram Oral Powder in Packet Take 1 Packet (17 g total) by mouth Daily    sertraline  (ZOLOFT ) 100 mg Oral Tablet Take 1 Tablet (100 mg total) by mouth Every night for 180 days Indications: repeated episodes of anxiety    tamsulosin  (FLOMAX ) 0.4 mg Oral Capsule Take 1 Capsule (0.4 mg total) by mouth Every evening after dinner for 90 days Indications: enlarged prostate with urination problem    traMADoL  (ULTRAM ) 50 mg Oral Tablet Take 1 Tablet (50 mg total) by mouth Every 6 hours as needed for Pain Indications: pain    vitamin B complex Oral Tablet Take 1 Tablet by mouth Daily     Family History:  Family Medical History:       Problem Relation (Age of Onset)    Brain cancer Father    Heart Disease Mother    Hypertension (High Blood Pressure) Mother    No Known Problems Sister, Brother, Maternal Grandmother, Maternal Grandfather, Paternal Grandmother, Paternal Grandfather, Daughter, Son, Maternal Aunt, Maternal Uncle, Paternal Aunt, Paternal Uncle, Half-Brother, Half-Sister, Other            Social History:  Social History     Socioeconomic History    Marital status: Married     Spouse name: Sherrilyn    Number of children: 2    Years of education: 12+    Highest education level: Bachelor's degree (e.g., BA, AB, BS)   Tobacco Use    Smoking status: Every Day     Current packs/day: 1.00     Average packs/day: 1 pack/day for 56.6  years (56.6 ttl pk-yrs)     Types: Cigarettes     Start date: 1969    Smokeless tobacco: Never    Tobacco comments:     Down to half a pack a day   Vaping Use    Vaping status: Never Used   Substance and Sexual Activity    Alcohol  use: Yes     Alcohol /week: 3.0 standard drinks of alcohol      Types: 3 Cans of beer per week     Comment: occasionally    Drug use: Never     Social Determinants of Health     Financial Resource Strain: Low Risk  (06/15/2022)    Financial Resource Strain     SDOH Financial: No   Transportation Needs: Low Risk  (06/15/2022)    Transportation Needs     SDOH Transportation: No   Social Connections: Low Risk  (12/12/2023)    Social Connections     SDOH Social Isolation: 5 or more times a week   Intimate Partner Violence: Low Risk  (06/15/2022)    Intimate Partner Violence     SDOH Domestic Violence: No   Housing Stability: Low Risk  (06/15/2022)  Housing Stability     SDOH Housing Situation: I have housing.     SDOH Housing Worry: No           Review of Systems:  Any pertinent Review of Systems as addressed in the HPI above.    Physical Exam:  Vital Signs:  Vitals:    01/02/24 1506   BP: 113/66   Pulse: 68   Resp: 17   Temp: 36.4 C (97.6 F)   TempSrc: Temporal   SpO2: 96%   Weight: 71.8 kg (158 lb 6 oz)   Height: 1.702 m (5' 7)   BMI: 24.81     Physical Exam  Vitals reviewed.   Constitutional:       Appearance: Normal appearance.   HENT:      Head: Normocephalic and atraumatic.      Nose: Nose normal.      Mouth/Throat:      Mouth: Mucous membranes are moist.   Eyes:      General: No scleral icterus.     Conjunctiva/sclera: Conjunctivae normal.   Cardiovascular:      Rate and Rhythm: Normal rate and regular rhythm.      Pulses: Normal pulses.      Heart sounds: Murmur heard.   Pulmonary:      Effort: Pulmonary effort is normal.      Breath sounds: Normal breath sounds. No wheezing, rhonchi or rales.   Abdominal:      General: Bowel sounds are normal.      Palpations: Abdomen is soft.    Musculoskeletal:         General: No swelling.   Skin:     General: Skin is warm.      Coloration: Skin is not cyanotic or pale.      Nails: There is no clubbing.   Neurological:      Mental Status: He is alert and oriented to person, place, and time. Mental status is at baseline.         Results:  Lab Results   Component Value Date    VITAMIND25 36.40 10/08/2023    VITAMIND25 53.14 06/29/2023    VITAMIND25 70.33 03/21/2023      HGB   Date Value Ref Range Status   12/28/2023 12.6 12.5 - 16.3 g/dL Final     INR   Date Value Ref Range Status   12/12/2023 0.96 0.84 - 1.10 Final     PLATELETS   Date Value Ref Range Status   12/18/2023 244 140 - 440 x10^3/uL Final         COMPREHENSIVE METABOLIC PANEL  Lab Results   Component Value Date    SODIUM 140 12/18/2023    POTASSIUM 4.9 12/18/2023    CHLORIDE 109 (H) 12/18/2023    CO2 27 12/18/2023    ANIONGAP 4 12/18/2023    BUN 15 12/18/2023    CREATININE 0.85 12/18/2023    GLUCOSENF 111 (H) 12/18/2023    CALCIUM 9.1 12/18/2023    ALBUMIN 4.1 12/12/2023    TOTALPROTEIN 6.6 12/12/2023    ALKPHOS 82 12/12/2023    AST 19 12/12/2023    ALT 19 12/12/2023    GFR 90 12/18/2023       PROSTATE SPECIFIC ANTIGEN   Lab Results   Component Value Date/Time    PROSSPECAG 2.22 03/07/2023 11:51 AM                  Lab Results   Component Value Date  CHOLESTEROL 107 10/08/2023    TRIG 270 (H) 10/08/2023    HDLCHOL 19 (L) 10/08/2023    LDLCHOL 34 10/08/2023    VLDLCAL 54 (H) 10/08/2023    CHOLHDLRATIO 5.6 10/08/2023      No results found for: TSH  Lab Results   Component Value Date    HA1C 5.3 06/29/2023          ICD-10-CM    1. Primary hypertension  I10 CBC/DIFF     COMPREHENSIVE METABOLIC PANEL, NON-FASTING     LIPID PANEL     VITAMIN D  25 TOTAL      2. Hyperlipidemia, unspecified hyperlipidemia type  E78.5 CBC/DIFF     COMPREHENSIVE METABOLIC PANEL, NON-FASTING     LIPID PANEL     VITAMIN D  25 TOTAL      3. Obstructive sleep apnea syndrome  G47.33 CBC/DIFF     COMPREHENSIVE METABOLIC  PANEL, NON-FASTING     LIPID PANEL     VITAMIN D  25 TOTAL      4. Gastroesophageal reflux disease without esophagitis  K21.9 CBC/DIFF     COMPREHENSIVE METABOLIC PANEL, NON-FASTING     LIPID PANEL     VITAMIN D  25 TOTAL      5. Vitamin D  deficiency, unspecified  E55.9 CBC/DIFF     COMPREHENSIVE METABOLIC PANEL, NON-FASTING     LIPID PANEL     VITAMIN D  25 TOTAL      6. Osteoarthritis, unspecified osteoarthritis type, unspecified site  M19.90 CBC/DIFF     COMPREHENSIVE METABOLIC PANEL, NON-FASTING     LIPID PANEL     VITAMIN D  25 TOTAL      7. Benign prostatic hyperplasia, unspecified whether lower urinary tract symptoms present  N40.0 CBC/DIFF     COMPREHENSIVE METABOLIC PANEL, NON-FASTING     LIPID PANEL     VITAMIN D  25 TOTAL      8. Generalized anxiety disorder  F41.1 CBC/DIFF     COMPREHENSIVE METABOLIC PANEL, NON-FASTING     LIPID PANEL     VITAMIN D  25 TOTAL      9. Gynecomastia, male  N21 CBC/DIFF     COMPREHENSIVE METABOLIC PANEL, NON-FASTING     LIPID PANEL     VITAMIN D  25 TOTAL      10. Closed fracture of left hip requiring operative repair with routine healing, subsequent encounter  S72.002D CBC/DIFF     COMPREHENSIVE METABOLIC PANEL, NON-FASTING     LIPID PANEL     VITAMIN D  25 TOTAL     DEXA BONE DENSITOMETRY      11. Hx of coronary artery bypass graft  Z95.1 CBC/DIFF     COMPREHENSIVE METABOLIC PANEL, NON-FASTING     LIPID PANEL     VITAMIN D  25 TOTAL      12. Other emphysema (CMS HCC)  J43.8 CBC/DIFF     COMPREHENSIVE METABOLIC PANEL, NON-FASTING     LIPID PANEL     VITAMIN D  25 TOTAL           Assessment/Plan:  Assessment & Plan  Hip Fracture  Ongoing recovery with emphasis on balance and strength to prevent future fractures. Discussed bone health and benefits of weight-bearing exercises.  - Order bone density scan.  - Check vitamin D  level.  - Encourage physical therapy and balance exercises.  - Advise weight-bearing exercises to improve bone density.  - Discuss the importance of muscle  strengthening around the hip.    Hypertension  Well-controlled with current medication  regimen.  - Continue losartan  25 mg twice a day.  - Continue metoprolol  12.5 mg.    Hyperlipidemia  Managed with atorvastatin  and Zetia .  - Continue atorvastatin  40 mg nightly  - Continue Zetia  10 mg daily.    Gastroesophageal Reflux Disease (GERD)  Managed with Pepcid . Advised discontinuation of omeprazole  due to potential long-term side effects.  - Discontinue omeprazole  40 mg in the morning.  - Continue Pepcid  twice a day.  - Advise dietary modifications to avoid spicy foods.    Obstructive Sleep Apnea  Uses CPAP.    Benign Prostatic Hyperplasia (BPH)  Managed with tamsulosin .  - Continue tamsulosin . 0.4mg  nightly   - mirabegron  daily  - he will need to follow up with urology in future.   - PSA due in Sept.      Depression and Anxiety  Managed with sertraline . Reports stable condition.  - Continue sertraline .    Smoking Cessation  Continues to smoke without nicotine  replacement therapy.    General Health Maintenance  Routine health maintenance with focus on bone health and physical activity.  - Order routine labs including CBC, comprehensive metabolic panel, lipid panel, and vitamin D  level.             Orders Placed This Encounter    DEXA BONE DENSITOMETRY    CBC/DIFF    COMPREHENSIVE METABOLIC PANEL, NON-FASTING    LIPID PANEL    VITAMIN D  25 TOTAL    CBC WITH DIFF    atorvastatin  (LIPITOR) 40 mg Oral Tablet    losartan  (COZAAR ) 25 mg Oral Tablet    tamsulosin  (FLOMAX ) 0.4 mg Oral Capsule    metoprolol  succinate (TOPROL -XL) 25 mg Oral Tablet Sustained Release 24 hr    famotidine  (PEPCID ) 40 mg Oral Tablet    ezetimibe  (ZETIA ) 10 mg Oral Tablet    sertraline  (ZOLOFT ) 100 mg Oral Tablet          Return in about 3 months (around 04/03/2024) for Chronic Disease Management.    Wilmary Levit L Casee Knepp, FNP-C     This note was created with assistance from [employer] via capture of conversational audio.  Consent was obtained from the patient prior  to recording.      Portions of this note may be dictated using voice recognition software or a dictation service. Variances in spelling and vocabulary are possible and unintentional. Not all errors are caught/corrected. Please notify the dino if any discrepancies are noted or if the meaning of any statement is not clear.          [1]   Allergies  Allergen Reactions    Penicillins  Other Adverse Reaction (Add comment) and Rash     Childhood allergy, unknown reaction

## 2024-01-03 ENCOUNTER — Ambulatory Visit (HOSPITAL_COMMUNITY)
Admission: RE | Admit: 2024-01-03 | Discharge: 2024-01-03 | Disposition: A | Discharge status: Still In Hospital | Payer: Self-pay | Source: Ambulatory Visit

## 2024-01-03 LAB — CBC WITH DIFF
BASOPHIL #: 0.1 x10ˆ3/uL (ref 0.00–0.10)
BASOPHIL %: 1 % (ref 0–1)
EOSINOPHIL #: 0.1 x10ˆ3/uL (ref 0.00–0.60)
EOSINOPHIL %: 1 % (ref 1–8)
HCT: 38.6 % (ref 36.7–47.1)
HGB: 13.1 g/dL (ref 12.5–16.3)
LYMPHOCYTE #: 3.2 x10ˆ3/uL — ABNORMAL HIGH (ref 1.00–3.00)
LYMPHOCYTE %: 26 % (ref 15–43)
MCH: 31.3 pg (ref 23.8–33.4)
MCHC: 33.9 g/dL (ref 32.5–36.3)
MCV: 92.3 fL (ref 73.0–96.2)
MONOCYTE #: 0.7 x10ˆ3/uL (ref 0.30–1.10)
MONOCYTE %: 6 % (ref 6–14)
MPV: 8.7 fL (ref 7.4–11.4)
NEUTROPHIL #: 8.1 x10ˆ3/uL — ABNORMAL HIGH (ref 1.85–7.84)
NEUTROPHIL %: 67 % (ref 44–74)
PLATELETS: 422 x10ˆ3/uL (ref 140–440)
RBC: 4.18 x10ˆ6/uL (ref 4.06–5.63)
RDW: 13.8 % (ref 12.1–16.2)
WBC: 12.2 x10ˆ3/uL — ABNORMAL HIGH (ref 3.6–10.2)

## 2024-01-03 LAB — COMPREHENSIVE METABOLIC PANEL, NON-FASTING
ALBUMIN/GLOBULIN RATIO: 1.4 (ref 0.8–1.4)
ALBUMIN: 4.4 g/dL (ref 3.5–5.7)
ALKALINE PHOSPHATASE: 150 U/L — ABNORMAL HIGH (ref 34–104)
ALT (SGPT): 15 U/L (ref 7–52)
ANION GAP: 7 mmol/L (ref 4–13)
AST (SGOT): 19 U/L (ref 13–39)
BILIRUBIN TOTAL: 0.4 mg/dL (ref 0.3–1.0)
BUN/CREA RATIO: 20 (ref 6–22)
BUN: 25 mg/dL (ref 7–25)
CALCIUM, CORRECTED: 9.1 mg/dL (ref 8.9–10.8)
CALCIUM: 9.4 mg/dL (ref 8.6–10.3)
CHLORIDE: 105 mmol/L (ref 98–107)
CO2 TOTAL: 27 mmol/L (ref 21–31)
CREATININE: 1.23 mg/dL (ref 0.60–1.30)
ESTIMATED GFR: 61 mL/min/1.73mˆ2 (ref >59)
GLOBULIN: 3.2 (ref 2.0–3.5)
GLUCOSE: 91 mg/dL (ref 74–109)
OSMOLALITY, CALCULATED: 282 mosm/kg (ref 270–290)
POTASSIUM: 4.2 mmol/L (ref 3.5–5.1)
PROTEIN TOTAL: 7.6 g/dL (ref 6.4–8.9)
SODIUM: 139 mmol/L (ref 136–145)

## 2024-01-03 LAB — LIPID PANEL
CHOL/HDL RATIO: 3.3
CHOLESTEROL: 75 mg/dL (ref <200)
HDL CHOL: 23 mg/dL — ABNORMAL LOW (ref >=40)
LDL CALC: 22 mg/dL (ref 0–100)
TRIGLYCERIDES: 149 mg/dL (ref <=150)
VLDL CALC: 30 mg/dL (ref 0–50)

## 2024-01-03 LAB — VITAMIN D 25 TOTAL: VITAMIN D 25, TOTAL: 42.88 ng/mL (ref 30.00–100.00)

## 2024-01-03 NOTE — PT Treatment (Signed)
 [facility]  Outpatient Physical Therapy  [address]  [place], [address]  [phone]  (Fax) [phone]    Physical Therapy Treatment Note    Date: 01/03/2024  Patient's Name: Corey [name]  Date of Birth: [birth year]  Physical Therapy Visit    Visit #/POC: 5/8  Authorization: 14 through 06/17/24  POC Signed?: yes  POC Ends: 01/18/2024  Order Ends: open  Next Progress Note Due: visit 8     Evaluating Physical Therapist: Damien Parcel, PT, DPT  PT diagnosis/Reason for Referral: closed L hip fracture  Next Scheduled Physician Appointment: 12/31/2023  Allergies/Contraindications: anterior, posterior, and lateral hip precautions until 8/14; fall risk, CABG x3 2023, back surgery, AAA, L knee flexion limited to 60 deg and it buckles occasionally     Subjective:  Pt drove himself to PT today. He is working on weaning to [facility] at home, but L hip still feels a little wobbly during stance phase. His R wrist is a little aggravated toaday.     Objective:  Activities as noted below.         EXERCISE/ACTIVITY NAME REPETITIONS RESISTANCE COMPLETED THIS DOS   L hip PROM  25'   y    Forward and retro walk in bars      Lateral walk in bars   5 trips         5 trips   yellow at knees      Yellow at knees   y         Y    L HS stretch 2'   y   Clams with band (lay on R) 3 x 10 yellow Y         Supine mini bridge 10x     y         NuStep (super small range due to knee)  7'  level 4 y   Mini squats   2 x 10 With UE support y   3 way hips (small range)  2 x 10 With UE support y   Heel raises  2 x 10 With UE support y   Standing march  2 x 10 With UE support y            DISCONTINUED ACTIVITIES               Manual- L ITB MFR  2' gentle n      Hip flexion AAROM with strap 3x   N-pain     Education on hip precautions 5'   n      HEP updated with 3-way hips, lateral walking, clamshells     Assessment:  Pt tolerated treatment well.  He did have too much discomfort to complete 10 reps on clamshells  today. He was able to progress L SLS with UE support during 3 way hips in small range.      Short-Term Goals: 2 Weeks   -  Patient will demonstrate improved L hip PROM to at least 0 to 90 degrees (ext-flex) to aid in ability to ambulate.                -  Patient will demonstrate independence with progressive HEP to maximize gains from physical therapy.                -  Patient will report max 5/10 pain to aid in completion of ADLs.                -  Patient will demonstrate improved strength of L LE WFL for supine SLR to aid in functional transfers.                -  Patient will wean to least restrictive assistive device with minimal to no gait deviation to aid community ambulation.     Long-Term Goals: 4 Weeks                -  Patient will demonstrate improved L hip strength of at least 4/5 to aid in functional transfers.                -  Patient will demonstrate improved TUG time of maximum 14 seconds with least restrictive assistive device to demonstrate decreased falls risk.                -  Patient will demonstrate 10 squats/sit <> stands with good form to aid in completion of ADLs.                -  Patient will demonstrate improved functional ability with Patient Specific Scale Score of at least 6.                -  Patient will demonstrate L hip AROM 0-90 deg (ext-flex) without pain to improve his ability to navigate stairs.            Plan: Will continue and progress as tolerated     Total Session Time 40 and Timed code minutes 40  THERAPEUTIC EXERCISE 40 minutes      Damien Parcel, PT  01/03/2024, 09:54

## 2024-01-08 ENCOUNTER — Ambulatory Visit (HOSPITAL_COMMUNITY)
Admission: RE | Admit: 2024-01-08 | Discharge: 2024-01-08 | Disposition: A | Discharge status: Still In Hospital | Payer: Self-pay | Source: Ambulatory Visit

## 2024-01-08 ENCOUNTER — Other Ambulatory Visit: Payer: Self-pay

## 2024-01-08 NOTE — PT Treatment (Signed)
 [facility]  Outpatient Physical Therapy  [address]  [place], [address]  [phone]  (Fax) [phone]    Physical Therapy Treatment Note    Date: 01/08/2024  Patient's Name: Corey [name]  Date of Birth: 02-Jul-1945  Physical Therapy Visit    Visit #/POC: 6/8  Authorization: 14 through 06/17/24  POC Signed?: yes  POC Ends: 01/18/2024  Order Ends: open  Next Progress Note Due: visit 8     Evaluating Physical Therapist: Damien Parcel, PT, DPT  PT diagnosis/Reason for Referral: closed L hip fracture  Next Scheduled Physician Appointment: 12/31/2023  Allergies/Contraindications: anterior, posterior, and lateral hip precautions until 8/14; fall risk, CABG x3 2023, back surgery, AAA, L knee flexion limited to 60 deg and it buckles occasionally     Subjective:  Patient has no new c/o. States he continues to try progressing to [facility], but remains a little fearful.     Objective:  Activities as noted below.         EXERCISE/ACTIVITY NAME REPETITIONS RESISTANCE COMPLETED THIS DOS   L hip PROM  65'   y    Forward and retro walk in bars      Lateral walk in bars   5 trips         5 trips   yellow at knees      Yellow at knees   y         Y    L HS stretch    y   Clams with band (lay on R) 3 x 10 yellow n         Supine mini bridge 10x     n         NuStep (super small range due to knee)  10'  level 4 y   Mini squats   x5 With UE support y   3 way hips (small range)  2 x 10 With UE support y   Heel raises  2 x 10 With UE support y   Standing march  2 x 10 With UE support y   Trigger release: L groin   y   MFR: L Quads mid to distal Hands and roller ball  y            DISCONTINUED ACTIVITIES               Manual- L ITB MFR  2' gentle n      Hip flexion AAROM with strap 3x   N-pain     Education on hip precautions 5'   n         Assessment:  only performed x5 reps with mini squats secondary to R knee popping with pain reported to be associated.  He states groin release does not seem  to have made much change in how he feels, but soft tissue mid to distal Quads does feel better. He is limited with L knee more so than limited by L hip.       Short-Term Goals: 2 Weeks   -  Patient will demonstrate improved L hip PROM to at least 0 to 90 degrees (ext-flex) to aid in ability to ambulate.                -  Patient will demonstrate independence with progressive HEP to maximize gains from physical therapy.                -  Patient will report  max 5/10 pain to aid in completion of ADLs.                -  Patient will demonstrate improved strength of L LE WFL for supine SLR to aid in functional transfers.                -  Patient will wean to least restrictive assistive device with minimal to no gait deviation to aid community ambulation.     Long-Term Goals: 4 Weeks                -  Patient will demonstrate improved L hip strength of at least 4/5 to aid in functional transfers.                -  Patient will demonstrate improved TUG time of maximum 14 seconds with least restrictive assistive device to demonstrate decreased falls risk.                -  Patient will demonstrate 10 squats/sit <> stands with good form to aid in completion of ADLs.                -  Patient will demonstrate improved functional ability with Patient Specific Scale Score of at least 6.                -  Patient will demonstrate L hip AROM 0-90 deg (ext-flex) without pain to improve his ability to navigate stairs.            Plan: Will increase Tband strength with exercises in // bars next visit.     Total Session Time 40 and Timed code minutes 40  THERAPEUTIC EXERCISE 40 minutes    Alfonso Ka, PTA  01/08/2024 11:21

## 2024-01-10 ENCOUNTER — Encounter (HOSPITAL_COMMUNITY): Payer: Self-pay

## 2024-01-10 ENCOUNTER — Other Ambulatory Visit: Payer: Self-pay

## 2024-01-10 ENCOUNTER — Ambulatory Visit
Admission: RE | Admit: 2024-01-10 | Discharge: 2024-01-10 | Disposition: A | Discharge status: Still In Hospital | Payer: Self-pay | Source: Ambulatory Visit | Attending: Orthopaedic Surgery | Admitting: Orthopaedic Surgery

## 2024-01-10 NOTE — Progress Notes (Addendum)
 [facility] Medicine [facility]  Outpatient Physical Therapy  [address]  [place], [address]  [phone]  (Fax) [phone]    Physical Therapy Progress Note    Date: 01/10/2024  Patient's Name: Corey [name]  Date of Birth: May 02, 1946  Physical Therapy Progress Note     Visit #/POC: 7/15  Authorization: 14 through 06/17/24  POC Signed?: yes  POC Ends: 02/07/2024  Order Ends: open  Next Progress Note Due: visit 8     Evaluating Physical Therapist: Damien Parcel, PT, DPT  PT diagnosis/Reason for Referral: closed L hip fracture  Next Scheduled Physician Appointment: 12/31/2023  Allergies/Contraindications: anterior, posterior, and lateral hip precautions until 8/14; fall risk, CABG x3 2023, back surgery, AAA, L knee flexion limited to 60 deg and it buckles occasionally     Subjective:  Patient has no new c/o. He is walking with SC now, but still feels off-balance. He is not able to navigate grass, play golf, or navigate stairs well yet. He reports 75% subjective improvement.     Objective:  Activities as noted below.         EXERCISE/ACTIVITY NAME REPETITIONS RESISTANCE COMPLETED THIS DOS   L hip PROM  55'   y    Forward and retro walk in bars      Lateral walk in bars   5 trips         5 trips   red at ankles        Red at ankles   y         Y    L HS stretch     y   Step ups L 2 x 10 4 y   Lateral step ups  L 2x 10 4 y   NuStep (super small range due to knee)  10'  level 4 y         3 way hips (small range)  2 x 10 With UE support y   Heel raises  2 x 10 With UE support y   Standing march  2 x 10 With UE support y   Trigger release: L groin     n   MFR: L Quads mid to distal Hands and roller ball   n            DISCONTINUED ACTIVITIES               Manual- L ITB MFR  2' gentle n      Hip flexion AAROM with strap 3x   N-pain      Education on hip precautions 5'   n      Clams with band (lay on R) 3 x 10 yellow n             Supine mini bridge 10x      n               Mini squats   x5  With UE support n     Patient-Specific Functional Score:     Problem Score   1. golfing 0   2. Walking across grass 0   3. Navigating stairs 5   Total 1.67   Total score = sum of the activity scores/number of activities    Minimal detectable change (90% CI) for avg score = 2 points    Minimal detectable change (90% CI) for single activity score = 3 points  OBJECTIVE     PROM    left   Hip Flexion 95 deg   Hip Extension 0 deg   Hip Abduction 35 deg   Hip Adduction 0   Hip IR NT   Hip ER 30 deg*   Knee Flexion 60 deg*      ROM comments tightness noted in L hip flexors, HS, Piriformis     Strength (Manual Muscle Testing per [employer] Muscle Grading system)       left   Hip Flexion 4-   Hip Extension 4-   Hip Abduction 4-   Hip Adduction 4-   Hip IR 4-   Hip ER 4-   Knee Flexion 4-   Knee Extension 4-            Assessment:  Pt is making good progress towards all goals. He demos improved L hip strength and mobility. He still has compensatory gait pattern and is at risk for falls. He will continue to benefit from skilled PT services to further improve functional LE strength, balance, and gait in order to promote return to walking without AD and golfing.      Short-Term Goals: 2 Weeks   -  Patient will demonstrate improved L hip PROM to at least 0 to 90 degrees (ext-flex) to aid in ability to ambulate. (MET 01/10/2024)               -  Patient will demonstrate independence with progressive HEP to maximize gains from physical therapy. (MET 01/10/2024)               -  Patient will report max 5/10 pain to aid in completion of ADLs. (MET 01/10/2024)               -  Patient will demonstrate improved strength of L LE WFL for supine SLR to aid in functional transfers. (MET 01/10/2024)               -  Patient will wean to least restrictive assistive device with minimal to no gait deviation to aid community ambulation. (MET 01/10/2024)    Long-Term  Goals: 4 Weeks                -  Patient will demonstrate improved L hip strength of at least 4/5 to aid in functional transfers. (PROGRESSING 01/10/2024)               -  Patient will demonstrate improved TUG time of maximum 14 seconds with least restrictive assistive device to demonstrate decreased falls risk. (PROGRESSING 01/10/2024)               -  Patient will demonstrate 10 squats/sit <> stands with good form to aid in completion of ADLs. (PROGRESSING 01/10/2024)               -  Patient will demonstrate improved functional ability with Patient Specific Scale Score of at least 6. (PROGRESSING 01/10/2024)               -  Patient will demonstrate L hip AROM 0-90 deg (ext-flex) without pain to improve his ability to navigate stairs. (PROGRESSING 01/10/2024)           Plan: continue PT 2x/wk for 4 more weeks    Total Session Time 40 and Timed code minutes 40  THERAPEUTIC EXERCISE 40 minutes      Damien Parcel, PT  01/10/2024, 11:26

## 2024-01-15 ENCOUNTER — Ambulatory Visit (HOSPITAL_COMMUNITY)
Admission: RE | Admit: 2024-01-15 | Discharge: 2024-01-15 | Disposition: A | Discharge status: Still In Hospital | Payer: Self-pay | Source: Ambulatory Visit

## 2024-01-15 ENCOUNTER — Other Ambulatory Visit: Payer: Self-pay

## 2024-01-15 DIAGNOSIS — R031 Nonspecific low blood-pressure reading: Secondary | ICD-10-CM | POA: Insufficient documentation

## 2024-01-15 DIAGNOSIS — Z96642 Presence of left artificial hip joint: Secondary | ICD-10-CM | POA: Insufficient documentation

## 2024-01-15 DIAGNOSIS — S72002A Fracture of unspecified part of neck of left femur, initial encounter for closed fracture: Secondary | ICD-10-CM | POA: Insufficient documentation

## 2024-01-15 DIAGNOSIS — S72002D Fracture of unspecified part of neck of left femur, subsequent encounter for closed fracture with routine healing: Secondary | ICD-10-CM | POA: Insufficient documentation

## 2024-01-15 NOTE — PT Treatment (Signed)
 [facility] Medicine [facility]  Outpatient Physical Therapy  [address]  [place], [address]  [phone]  (Fax) [phone]    Physical Therapy Treatment Note    Date: 01/15/2024  Patient's Name: Corey [name]  Date of Birth: January 24, 1946  Physical Therapy Visit    Visit #/POC: 7/15  Authorization: 14 through 06/17/24  POC Signed?: yes  POC Ends: 02/07/2024  Order Ends: open  Next Progress Note Due: visit 14     Evaluating Physical Therapist: Damien Parcel, PT, DPT  PT diagnosis/Reason for Referral: closed L hip fracture  Next Scheduled Physician Appointment: 12/31/2023  Allergies/Contraindications: anterior, posterior, and lateral hip precautions until 8/14; fall risk, CABG x3 2023, back surgery, AAA, L knee flexion limited to 60 deg and it buckles occasionally     Subjective:  Patient has no new c/o. He arrives with 2WW and reports continued improvements in L hip pain.     Objective:  Activities as noted below.         EXERCISE/ACTIVITY NAME REPETITIONS RESISTANCE COMPLETED THIS DOS   L hip PROM  56'   y    Forward and retro walk in bars      Lateral walk in bars   5 trips         5 trips   green at ankles        green at ankles   n         n   L HS stretch  3'   y   Step ups L 2 x 10 4 y   Lateral step ups  L 2x 10 4 y   NuStep (super small range due to knee)  10'  level 4 y    lateral walk with cables 2 x 10 ea 7.5  y   3 way hips (small range)  2 x 10 green y   Heel raises  2 x 10 With UE support y   Standing march  2 x 10 On air ex y   Golf swing to feet 2 x 10 2.5# y   SL RB balance 3'  y   Trigger release: L groin     n   MFR: L Quads mid to distal Hands and roller ball   n            DISCONTINUED ACTIVITIES               Manual- L ITB MFR  2' gentle n      Hip flexion AAROM with strap 3x   N-pain      Education on hip precautions 5'   n      Clams with band (lay on R) 3 x 10 yellow n             Supine mini bridge 10x      n                Mini squats   x5 With UE support n              Assessment:  Pt is able to progress functional L hip strength with fatigue but no increased pain after treatment today. Gait quality continues to improve.     Short-Term Goals: 2 Weeks   -  Patient will demonstrate improved L hip PROM to at least 0 to 90 degrees (ext-flex) to aid in ability to ambulate. (MET 01/10/2024)               -  Patient will demonstrate independence with progressive HEP to maximize gains from physical therapy. (MET 01/10/2024)               -  Patient will report max 5/10 pain to aid in completion of ADLs. (MET 01/10/2024)               -  Patient will demonstrate improved strength of L LE WFL for supine SLR to aid in functional transfers. (MET 01/10/2024)               -  Patient will wean to least restrictive assistive device with minimal to no gait deviation to aid community ambulation. (MET 01/10/2024)    Long-Term Goals: 4 Weeks                -  Patient will demonstrate improved L hip strength of at least 4/5 to aid in functional transfers. (PROGRESSING 01/10/2024)               -  Patient will demonstrate improved TUG time of maximum 14 seconds with least restrictive assistive device to demonstrate decreased falls risk. (PROGRESSING 01/10/2024)               -  Patient will demonstrate 10 squats/sit <> stands with good form to aid in completion of ADLs. (PROGRESSING 01/10/2024)               -  Patient will demonstrate improved functional ability with Patient Specific Scale Score of at least 6. (PROGRESSING 01/10/2024)               -  Patient will demonstrate L hip AROM 0-90 deg (ext-flex) without pain to improve his ability to navigate stairs. (PROGRESSING 01/10/2024)           Plan: assess response to treatment and progress as able    Total Session Time 40 and Timed code minutes 40  THERAPEUTIC EXERCISE 40 minutes      Damien Parcel, PT  01/15/2024, 17:56

## 2024-01-17 ENCOUNTER — Ambulatory Visit (HOSPITAL_COMMUNITY): Payer: Self-pay

## 2024-01-18 ENCOUNTER — Other Ambulatory Visit: Payer: Self-pay

## 2024-01-18 ENCOUNTER — Ambulatory Visit (HOSPITAL_COMMUNITY)
Admission: RE | Admit: 2024-01-18 | Discharge: 2024-01-18 | Disposition: A | Discharge status: Still In Hospital | Payer: Self-pay | Source: Ambulatory Visit

## 2024-01-18 NOTE — PT Treatment (Signed)
 [facility] Medicine [facility]  Outpatient Physical Therapy  [address]  [place], [address]  [phone]  (Fax) [phone]    Physical Therapy Progress Note    Date: 01/18/2024  Patient's Name: Corey [name]  Date of Birth: [birth year]  Physical Therapy Progress Note     Visit #/POC: 8/15 (2x/wk)  Authorization: 14 through 06/17/24  POC Signed?: yes  POC Ends: 02/07/2024  Order Ends: open  Next Progress Note Due: visit 14     Evaluating Physical Therapist: Damien Parcel, PT, DPT  PT diagnosis/Reason for Referral: closed L hip fracture  Next Scheduled Physician Appointment: 12/31/2023  Allergies/Contraindications: anterior, posterior, and lateral hip precautions until 8/14; fall risk, CABG x3 2023, back surgery, AAA, L knee flexion limited to 60 deg and it buckles occasionally     Subjective:  Patient has no new c/o. He was not sore after last treatment. He is still using the walker.      Objective:  Activities as noted below.         EXERCISE/ACTIVITY NAME REPETITIONS RESISTANCE COMPLETED THIS DOS   L hip PROM  62'   y    Forward and retro walk in bars      Lateral walk in bars   5 trips         5 trips   green at ankles        green at ankles   n         n   L HS stretch  3'   y   Step ups L 2 x 10 4 y   Lateral step ups  L 2x 10 4 y   NuStep (super small range due to knee)  10'  level 5 y    lateral walk with cables 2 x 10 ea 10# (CGA)  y   3 way hips (small range)  2 x 10 green y   Heel raises  2 x 10 With UE support n   Standing march  2 x 10 On air ex, with anti-rot 5# cable y   Golf swing to feet 2 x 10 5# y   SL RB balance 3'   n   Trigger release: L groin     n   MFR: L Quads mid to distal Hands and roller ball   n            DISCONTINUED ACTIVITIES            Manual- L ITB MFR  2' gentle n      Hip flexion AAROM with strap 3x   N-pain      Education on hip precautions 5'   n      Clams with band (lay on R) 3 x 10 yellow n             Supine mini bridge 10x      n                 Mini squats   x5 With UE support n             Assessment:  Pt is able to demo safe gait 300' with SC in R hand today. PT encourages him to practice walking with SC at home. He is progressing well towards goals with improved functional strength appreciated during w/o today.     Short-Term Goals: 2 Weeks   -  Patient will demonstrate improved L hip PROM to at  least 0 to 90 degrees (ext-flex) to aid in ability to ambulate. (MET 01/10/2024)               -  Patient will demonstrate independence with progressive HEP to maximize gains from physical therapy. (MET 01/10/2024)               -  Patient will report max 5/10 pain to aid in completion of ADLs. (MET 01/10/2024)               -  Patient will demonstrate improved strength of L LE WFL for supine SLR to aid in functional transfers. (MET 01/10/2024)               -  Patient will wean to least restrictive assistive device with minimal to no gait deviation to aid community ambulation. (MET 01/10/2024)    Long-Term Goals: 4 Weeks                -  Patient will demonstrate improved L hip strength of at least 4/5 to aid in functional transfers. (PROGRESSING 01/10/2024)               -  Patient will demonstrate improved TUG time of maximum 14 seconds with least restrictive assistive device to demonstrate decreased falls risk. (PROGRESSING 01/10/2024)               -  Patient will demonstrate 10 squats/sit <> stands with good form to aid in completion of ADLs. (PROGRESSING 01/10/2024)               -  Patient will demonstrate improved functional ability with Patient Specific Scale Score of at least 6. (PROGRESSING 01/10/2024)               -  Patient will demonstrate L hip AROM 0-90 deg (ext-flex) without pain to improve his ability to navigate stairs. (PROGRESSING 01/10/2024)           Plan: assess response to treatment and progress as able    Total Session Time 40 and Timed code minutes 40  THERAPEUTIC EXERCISE 40 minutes      Damien Parcel, PT  01/18/2024, 15:39

## 2024-01-21 ENCOUNTER — Ambulatory Visit
Admission: RE | Admit: 2024-01-21 | Discharge: 2024-01-21 | Disposition: A | Discharge status: Home / Self Care | Payer: Self-pay | Source: Ambulatory Visit | Attending: Family | Admitting: Family

## 2024-01-21 ENCOUNTER — Other Ambulatory Visit: Payer: Self-pay

## 2024-01-21 DIAGNOSIS — S72002D Fracture of unspecified part of neck of left femur, subsequent encounter for closed fracture with routine healing: Secondary | ICD-10-CM | POA: Insufficient documentation

## 2024-01-21 DIAGNOSIS — Z13820 Encounter for screening for osteoporosis: Secondary | ICD-10-CM

## 2024-01-21 DIAGNOSIS — M810 Age-related osteoporosis without current pathological fracture: Secondary | ICD-10-CM | POA: Insufficient documentation

## 2024-01-22 ENCOUNTER — Ambulatory Visit (HOSPITAL_COMMUNITY)
Admission: RE | Admit: 2024-01-22 | Discharge: 2024-01-22 | Disposition: A | Discharge status: Still In Hospital | Payer: Self-pay | Source: Ambulatory Visit

## 2024-01-22 ENCOUNTER — Ambulatory Visit (RURAL_HEALTH_CENTER): Payer: Self-pay | Admitting: Family

## 2024-01-22 ENCOUNTER — Other Ambulatory Visit (RURAL_HEALTH_CENTER): Payer: Self-pay | Admitting: Family

## 2024-01-22 DIAGNOSIS — M8000XA Age-related osteoporosis with current pathological fracture, unspecified site, initial encounter for fracture: Secondary | ICD-10-CM

## 2024-01-22 DIAGNOSIS — S72009A Fracture of unspecified part of neck of unspecified femur, initial encounter for closed fracture: Secondary | ICD-10-CM

## 2024-01-22 MED ORDER — PROLIA 60 MG/ML SUBCUTANEOUS SYRINGE
60.0000 mg | INJECTION | SUBCUTANEOUS | 1 refills | Status: AC
Start: 2024-01-22 — End: 2025-01-21

## 2024-01-22 NOTE — Result Encounter Note (Signed)
 [name] can you give Corey [name] a call regarding his bone density scan.  Not surprising...  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  I would like to see if I can get him approved for Prolia .- It may or may not get approved. And then will have to see about the cost.   This is an injectable medications that he will get at the hospital infusion center once every six-month. The medication is referred to as a RANKL- it works by inhibiting osteoclast formation and helps reduces bone resorption. It is shown to increase bone density in 1-2 months with the risk of fractures decreasing significantly in 1-2 years. . The down side with this medication, beside normal side effects that will need to monitor. If you stop the medication the osteoporosis will return quickly... Lifetime commitment.   Some common side effect is infection, bronchitis, headaches, indigestion, UTI, constipation, low calcium levels, itching, many more.   Thus far, if I can get it approved, my patients have tolerated it well.   If we can not get it approved will have to discuss starting a oral bisphosphonate, such as fosamax. Some insurance want you to do bisphosphonate first. The issue with them is some can not tolerate the medication due to GI side effects. -heartburn or peptic ulcers.    The good news is your Vitamin D  level is good and your Calcium level was good in July. I would like you to start an OTC Calcium supplement with Vitamin D . Take as recommended on the bottle.   Let me know if he has any questions or concerns. Will be in touch, once we get the insurances decision.

## 2024-01-22 NOTE — Progress Notes (Signed)
 FAMILY MEDICINE, [facility]  [address]  [place] [address]  Operated by [facility]    Name: Corey [name] MRN:  [id number]   Date: 01/22/2024   DOB:  30-Nov-1945 (78 y.o.)         There were no vitals filed for this visit.        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     Osteoporosis treatment start date:   01/28/2024.    Failed/Intolerance to other treatments:  RHEUMOSTEOPTXFAILED: none.    Reason for discontinuation of previous therapies: N/a .    Bone Scan T Score: -2.9  R hip femoral neck   FRAX Score: 18.6% for Major osteoporotic fracture History of Osteoporotic Fractures: Yes- Recent L hip.    Indications for Prolia :  Osteoporosis in male    No outpatient medications have been marked as taking for the 01/22/24 encounter (Orders Only) with Cailynn Bodnar L, FNP.       Treatment: Prolia  60 mg/mL SQ injection repeat every six (6) months    Labs:  Lab Results   Component Value Date    ALKPHOS 150 (H)  01/02/2024    SODIUM 139 01/02/2024    POTASSIUM 4.2 01/02/2024    CHLORIDE 105 01/02/2024    CO2 27 01/02/2024    ANIONGAP 7 01/02/2024    CALCIUM 9.4 01/02/2024    GLUCOSENF 91 01/02/2024    BUN 25 01/02/2024    CREATININE 1.23 01/02/2024    BUNCRRATIO 20 01/02/2024    GFR 61 01/02/2024    OSMBLD 282 01/02/2024      Latest Reference Range & Units 01/02/24 16:20   VITAMIN D  25, TOTAL 30.00 - 100.00 ng/mL 42.88         Currently taking calcium at least 1,000 mg calcium daily:   Yes- Just recommended starting OTC supplementation.      Currently taking vitamin D  1,000 units daily:  Yes- Just recommended starting OTC supplementation.     Greig LITTIE Mills, FNP  01/22/2024, 12:34

## 2024-01-22 NOTE — PT Treatment (Signed)
 [facility] Medicine [facility]  Outpatient Physical Therapy  [address]  [place], [address]  [phone]  (Fax) [phone]    Physical Therapy Treatment Note    Date: 01/22/2024  Patient's Name: Corey [name]  Date of Birth: 12-30-1945  Physical Therapy Visit    Visit #/POC: 10/15 (2x/wk)  Authorization: 14 through 06/17/24  POC Signed?: yes  POC Ends: 02/07/2024  Order Ends: open  Next Progress Note Due: visit 14     Evaluating Physical Therapist: Damien Parcel, PT, DPT  PT diagnosis/Reason for Referral: closed L hip fracture  Next Scheduled Physician Appointment: 12/31/2023  Allergies/Contraindications: anterior, posterior, and lateral hip precautions until 8/14; fall risk, CABG x3 2023, back surgery, AAA, L knee flexion limited to 60 deg and it buckles occasionally     Subjective:  Pt has transitioned to using SC for gait. He was able to walk around the block with his wife. He was sore for 2 days following last treatment.      Objective:  Activities as noted below.         EXERCISE/ACTIVITY NAME REPETITIONS RESISTANCE COMPLETED THIS DOS   L hip PROM  38'   y    Forward and retro walk in bars      Lateral walk in bars   5 trips         5 trips   green at ankles        green at ankles   n         n   L HS stretch  3'   y   Step ups L 2 x 10 4 y   Lateral step ups  L 2x 10 4 y   NuStep (super small range due to knee)  10'  level 5 y    lateral walk with cables 2 x 10 ea 10# (CGA)  y   3 way hips (small range)  2 x 10 green y   Heel raises  2 x 10 With UE support n   Standing march  2 x 10 On air ex, with anti-rot 5# cable y   Golf swing to feet 2 x 10 5# y   SL RB balance 3'   n   Trigger release: L groin  x10   y   MFR: L Quads mid to distal Hands and roller ball   n            DISCONTINUED ACTIVITIES            Manual- L ITB MFR  2' gentle n      Hip flexion AAROM with strap 3x   N-pain      Education on hip precautions 5'   n      Clams with band (lay on R) 3 x 10 yellow n              Supine mini bridge 10x      n                Mini squats   x5 With UE support n             Assessment:  Pt demos continued improvements in L hip strength and endurance. CGA for resisted lateral walks.      Short-Term Goals: 2 Weeks   -  Patient will demonstrate improved L hip PROM to at least 0 to 90 degrees (ext-flex) to aid in ability to ambulate. (MET 01/10/2024)               -  Patient will demonstrate independence with progressive HEP to maximize gains from physical therapy. (MET 01/10/2024)               -  Patient will report max 5/10 pain to aid in completion of ADLs. (MET 01/10/2024)               -  Patient will demonstrate improved strength of L LE WFL for supine SLR to aid in functional transfers. (MET 01/10/2024)               -  Patient will wean to least restrictive assistive device with minimal to no gait deviation to aid community ambulation. (MET 01/10/2024)    Long-Term Goals: 4 Weeks                -  Patient will demonstrate improved L hip strength of at least 4/5 to aid in functional transfers. (PROGRESSING 01/10/2024)               -  Patient will demonstrate improved TUG time of maximum 14 seconds with least restrictive assistive device to demonstrate decreased falls risk. (PROGRESSING 01/10/2024)               -  Patient will demonstrate 10 squats/sit <> stands with good form to aid in completion of ADLs. (PROGRESSING 01/10/2024)               -  Patient will demonstrate improved functional ability with Patient Specific Scale Score of at least 6. (PROGRESSING 01/10/2024)               -  Patient will demonstrate L hip AROM 0-90 deg (ext-flex) without pain to improve his ability to navigate stairs. (PROGRESSING 01/10/2024)           Plan: assess response to treatment and progress as able    Total Session Time 40 and Timed code minutes 40  THERAPEUTIC EXERCISE 40 minutes      Damien Parcel, PT  01/22/2024, 10:46

## 2024-01-24 NOTE — Result Encounter Note (Signed)
 Results reviewed by patient per epic.

## 2024-01-25 ENCOUNTER — Ambulatory Visit (HOSPITAL_COMMUNITY)
Admission: RE | Admit: 2024-01-25 | Discharge: 2024-01-25 | Disposition: A | Discharge status: Still In Hospital | Payer: Self-pay | Source: Ambulatory Visit

## 2024-01-25 DIAGNOSIS — Z96642 Presence of left artificial hip joint: Secondary | ICD-10-CM

## 2024-01-25 NOTE — PT Treatment (Signed)
 [facility] Medicine [facility]  Outpatient Physical Therapy  [address]  [place], [address]  [phone]  (Fax) [phone]    Physical Therapy Treatment Note    Date: 01/25/2024  Patient's Name: Corey [name]  Date of Birth: [birth year]  Physical Therapy Visit    Visit #/POC: 11/15 (2x/wk)  Authorization: 14 through 06/17/24  POC Signed?: yes  POC Ends: 02/07/2024  Order Ends: open  Next Progress Note Due: visit 14     Evaluating Physical Therapist: Damien Parcel, PT, DPT  PT diagnosis/Reason for Referral: closed L hip fracture  Next Scheduled Physician Appointment: 12/31/2023  Allergies/Contraindications: anterior, posterior, and lateral hip precautions until 8/14; fall risk, CABG x3 2023, back surgery, AAA, L knee flexion limited to 60 deg and it buckles occasionally      Subjective:  Pt reports groin pain continues.  .  States groin pain is essentially unchanged from the beginning.  Reports he tried to swing a golf club and it was not fun.  Pt notes he is more worried about his balance when it comes to swinging the club.  Feels about 90% better most of the time.      Objective:  Activities as noted below.     EXERCISE/ACTIVITY NAME REPETITIONS RESISTANCE COMPLETED THIS DOS   L hip PROM  79'   y    Forward and retro walk in bars      Lateral walk in bars   5 trips         5 trips   green at ankles        green at ankles   n         n   L HS stretch   Standing hip flexor stretch on step  3'   Y     Y    Step ups L 2 x 10 6 y   Lateral step ups  L 2x 10 6 y   NuStep (super small range due to knee)  10'  level 5 y    lateral walk with cables 2 x 10 ea 10# (CGA)  y   3 way hips (small range)  2 x 10 green y   Heel raises  2 x 10 With UE support n   Standing march  2 x 10 On air ex, with anti-rot 5# cable n   Golf swing to feet 2 x 10 5# n   SL RB balance 3'   n   Trigger release: L groin  x10   y   MFR: L Quads mid to distal Hands and roller ball   n            DISCONTINUED  ACTIVITIES            Manual- L ITB MFR  2' gentle n      Hip flexion AAROM with strap 3x   N-pain      Education on hip precautions 5'   n      Clams with band (lay on R) 3 x 10 yellow n             Supine mini bridge 10x      n                Mini squats   x5 With UE support n           Assessment: Pt tolerated treatment well.  He does continue with Lgroin pain.  Pt has  long standing leg discrepancy that possibly contributes to pain .      Short-Term Goals: 2 Weeks   -  Patient will demonstrate improved L hip PROM to at least 0 to 90 degrees (ext-flex) to aid in ability to ambulate. (MET 01/10/2024)               -  Patient will demonstrate independence with progressive HEP to maximize gains from physical therapy. (MET 01/10/2024)               -  Patient will report max 5/10 pain to aid in completion of ADLs. (MET 01/10/2024)               -  Patient will demonstrate improved strength of L LE WFL for supine SLR to aid in functional transfers. (MET 01/10/2024)               -  Patient will wean to least restrictive assistive device with minimal to no gait deviation to aid community ambulation. (MET 01/10/2024)    Long-Term Goals: 4 Weeks                -  Patient will demonstrate improved L hip strength of at least 4/5 to aid in functional transfers. (PROGRESSING 01/10/2024)               -  Patient will demonstrate improved TUG time of maximum 14 seconds with least restrictive assistive device to demonstrate decreased falls risk. (PROGRESSING 01/10/2024)               -  Patient will demonstrate 10 squats/sit <> stands with good form to aid in completion of ADLs. (PROGRESSING 01/10/2024)               -  Patient will demonstrate improved functional ability with Patient Specific Scale Score of at least 6. (PROGRESSING 01/10/2024)               -  Patient will demonstrate L hip AROM 0-90 deg (ext-flex) without pain to improve his ability to navigate stairs. (PROGRESSING 01/10/2024)               Plan:  Will continue and  progress as tolerated     Total Session Time 35 and Timed code minutes 35  THERAPEUTIC EXERCISE 35 minutes      Dhilan Brauer, PTA  01/25/2024, 09:34

## 2024-01-29 ENCOUNTER — Ambulatory Visit (RURAL_HEALTH_CENTER): Payer: Self-pay | Attending: Family

## 2024-01-29 ENCOUNTER — Other Ambulatory Visit: Payer: Self-pay

## 2024-01-29 ENCOUNTER — Encounter (RURAL_HEALTH_CENTER): Payer: Self-pay | Admitting: Family

## 2024-01-29 ENCOUNTER — Other Ambulatory Visit (RURAL_HEALTH_CENTER): Payer: Self-pay | Admitting: Family

## 2024-01-29 ENCOUNTER — Ambulatory Visit (HOSPITAL_COMMUNITY): Payer: Self-pay

## 2024-01-29 DIAGNOSIS — M8000XA Age-related osteoporosis with current pathological fracture, unspecified site, initial encounter for fracture: Secondary | ICD-10-CM | POA: Insufficient documentation

## 2024-01-29 MED ORDER — CALTRATE PLUS D  600 MG (CARBONATE)-20 MCG (800 UNIT) CHEWABLE TABLET
1.0000 | CHEWABLE_TABLET | Freq: Every day | ORAL | 0 refills | Status: DC
Start: 2024-01-29 — End: 2024-04-02

## 2024-01-29 MED ORDER — DENOSUMAB 60 MG/ML SUBCUTANEOUS SYRINGE
60.0000 mg | INJECTION | SUBCUTANEOUS | Status: AC
Start: 2024-01-29 — End: 2025-01-23
  Administered 2024-01-29: 60 mg via SUBCUTANEOUS

## 2024-01-30 ENCOUNTER — Other Ambulatory Visit (HOSPITAL_COMMUNITY): Payer: Self-pay

## 2024-01-30 ENCOUNTER — Ambulatory Visit (HOSPITAL_COMMUNITY)
Admission: RE | Admit: 2024-01-30 | Discharge: 2024-01-30 | Disposition: A | Discharge status: Still In Hospital | Payer: Self-pay | Source: Ambulatory Visit

## 2024-01-30 DIAGNOSIS — Z96642 Presence of left artificial hip joint: Secondary | ICD-10-CM

## 2024-01-30 NOTE — PT Treatment (Signed)
 [facility]  Outpatient Physical Therapy  [address]  [place], [address]  [phone]  (Fax) [phone]    Physical Therapy Treatment Note    Date: 01/30/2024  Patient's Name: Corey [name]  Date of Birth: [birth year]  Physical Therapy Visit    Visit #/POC: 12/15 (2x/wk)  Authorization: 14 through 06/17/24  POC Signed?: yes  POC Ends: 02/07/2024  Order Ends: open  Next Progress Note Due: visit 14     Evaluating Physical Therapist: Damien Parcel, PT, DPT  PT diagnosis/Reason for Referral: closed L hip fracture  Next Scheduled Physician Appointment: 12/31/2023  Allergies/Contraindications: anterior, posterior, and lateral hip precautions until 8/14; fall risk, CABG x3 2023, back surgery, AAA, L knee flexion limited to 60 deg and it buckles occasionally        Subjective:  Pt reports groin pain continues.  He is unable to golf, and he reports 60% subjective improvement. He is also having continued difficulty with walking community distances and navigating uneven terrain.      Objective:  Activities as noted below.      EXERCISE/ACTIVITY NAME REPETITIONS RESISTANCE COMPLETED THIS DOS   L hip PROM  61'   y    Forward and retro walk in bars      Lateral walk in bars   5 trips         5 trips   green at ankles        green at ankles   n         n   L HS stretch   Standing hip flexor stretch on step  3'   Y      Y    Step ups L 2 x 10 6 y   Lateral step ups  L 2x 10 6 y   NuStep (super small range due to knee)  10'  level 5 y    lateral walk with cables 2 x 10 ea 10# (CGA)  y   3 way hips (small range)  2 x 10 green n   Heel raises  2 x 10 With UE support n   Standing march  2 x 10 On air ex, with anti-rot 5# cable y   Golf swing to feet 2 x 10 5# y   SL RB balance 3'   y   Trigger release: L groin  x10   n   MFR: L Quads mid to distal Hands and roller ball   n            DISCONTINUED ACTIVITIES            Manual- L ITB MFR  2' gentle n      Hip flexion AAROM with strap 3x    N-pain      Education on hip precautions 5'   n      Clams with band (lay on R) 3 x 10 yellow n             Supine mini bridge 10x      n                Mini squats   x5 With UE support n            Assessment: Pt tolerated treatment well.  L LE gets sore after strengthening activities which does increase antalgic gait pattern after treatment. He continues to have SC in R hand. L hip strength and balance are still  insufficient to support SLS without UE support.      Short-Term Goals: 2 Weeks   -  Patient will demonstrate improved L hip PROM to at least 0 to 90 degrees (ext-flex) to aid in ability to ambulate. (MET 01/10/2024)               -  Patient will demonstrate independence with progressive HEP to maximize gains from physical therapy. (MET 01/10/2024)               -  Patient will report max 5/10 pain to aid in completion of ADLs. (MET 01/10/2024)               -  Patient will demonstrate improved strength of L LE WFL for supine SLR to aid in functional transfers. (MET 01/10/2024)               -  Patient will wean to least restrictive assistive device with minimal to no gait deviation to aid community ambulation. (MET 01/10/2024)    Long-Term Goals: 4 Weeks                -  Patient will demonstrate improved L hip strength of at least 4/5 to aid in functional transfers. (PROGRESSING 01/10/2024)               -  Patient will demonstrate improved TUG time of maximum 14 seconds with least restrictive assistive device to demonstrate decreased falls risk. (PROGRESSING 01/10/2024)               -  Patient will demonstrate 10 squats/sit <> stands with good form to aid in completion of ADLs. (PROGRESSING 01/10/2024)               -  Patient will demonstrate improved functional ability with Patient Specific Scale Score of at least 6. (PROGRESSING 01/10/2024)               -  Patient will demonstrate L hip AROM 0-90 deg (ext-flex) without pain to improve his ability to navigate stairs. (PROGRESSING 01/10/2024)        Plan:   Will continue and progress as tolerated     Total Session Time 40 and Timed code minutes 40  THERAPEUTIC EXERCISE 40 minutes      Damien Parcel, PT  01/30/2024, 09:02

## 2024-01-31 ENCOUNTER — Ambulatory Visit (HOSPITAL_COMMUNITY)
Admission: RE | Admit: 2024-01-31 | Discharge: 2024-01-31 | Disposition: A | Discharge status: Still In Hospital | Payer: Self-pay | Source: Ambulatory Visit | Attending: Orthopaedic Surgery | Admitting: Orthopaedic Surgery

## 2024-01-31 ENCOUNTER — Other Ambulatory Visit: Payer: Self-pay

## 2024-01-31 NOTE — PT Treatment (Signed)
 [facility]  Outpatient Physical Therapy  [address]  [place], [address]  [phone]  (Fax) [phone]    Physical Therapy Treatment Note    Date: 01/31/2024  Patient's Name: Corey [name]  Date of Birth: 24-Jun-1945  Physical Therapy Visit    Visit #/POC: 13/15 (2x/wk)  Authorization: 14 through 06/17/24  POC Signed?: yes  POC Ends: 02/07/2024  Order Ends: open  Next Progress Note Due: visit 14     Evaluating Physical Therapist: Damien Parcel, PT, DPT  PT diagnosis/Reason for Referral: closed L hip fracture  Next Scheduled Physician Appointment: 12/31/2023  Allergies/Contraindications: anterior, posterior, and lateral hip precautions until 8/14; fall risk, CABG x3 2023, back surgery, AAA, L knee flexion limited to 60 deg and it buckles occasionally        Subjective:  Pt reports lingering L hip soreness from treatment yesterday.      Objective:  Activities as noted below.      EXERCISE/ACTIVITY NAME REPETITIONS RESISTANCE COMPLETED THIS DOS   L hip PROM  87'   y    Forward and retro walk      Lateral walk   100' ea         100' ea  gray t-tube      gray t-tube  y         y   L HS stretch   Standing hip flexor stretch on step  3'   y      n   Step ups L 2 x 10 6 y   Lateral step ups  L 2x 10 6 y   NuStep (super small range due to knee)  10'  level 5 y    lateral walk with cables 2 x 10 ea 10# (CGA)  n   3 way hips (small range)  2 x 10 green n   Heel raises  2 x 10 With UE support n   Standing march  2 x 10 With red tube at toes y   Golf swing to feet 2 x 10 5# n   SL RB balance 3'   n   Trigger release: L groin  x10   n   MFR: L glutes, lumbar paraspinals, HS 10'   y   SLR 10x  Y (HEP)   S/l hip abd 10x  y            DISCONTINUED ACTIVITIES            Manual- L ITB MFR  2' gentle n      Hip flexion AAROM with strap 3x   N-pain      Education on hip precautions 5'   n      Clams with band (lay on R) 3 x 10 yellow n             Supine mini bridge 10x      n                 Mini squats   x5 With UE support n            Assessment: Pt tolerated treatment well with manual and stretching emphasis as he is still sore from treatment yesterday. He reports decreased L hip pain after treatment. He is still very challenged by SLR, so they were added to HEP.      Short-Term Goals: 2 Weeks   -  Patient will demonstrate improved L hip PROM  to at least 0 to 90 degrees (ext-flex) to aid in ability to ambulate. (MET 01/10/2024)               -  Patient will demonstrate independence with progressive HEP to maximize gains from physical therapy. (MET 01/10/2024)               -  Patient will report max 5/10 pain to aid in completion of ADLs. (MET 01/10/2024)               -  Patient will demonstrate improved strength of L LE WFL for supine SLR to aid in functional transfers. (MET 01/10/2024)               -  Patient will wean to least restrictive assistive device with minimal to no gait deviation to aid community ambulation. (MET 01/10/2024)    Long-Term Goals: 4 Weeks                -  Patient will demonstrate improved L hip strength of at least 4/5 to aid in functional transfers. (PROGRESSING 01/10/2024)               -  Patient will demonstrate improved TUG time of maximum 14 seconds with least restrictive assistive device to demonstrate decreased falls risk. (PROGRESSING 01/10/2024)               -  Patient will demonstrate 10 squats/sit <> stands with good form to aid in completion of ADLs. (PROGRESSING 01/10/2024)               -  Patient will demonstrate improved functional ability with Patient Specific Scale Score of at least 6. (PROGRESSING 01/10/2024)               -  Patient will demonstrate L hip AROM 0-90 deg (ext-flex) without pain to improve his ability to navigate stairs. (PROGRESSING 01/10/2024)        Plan:  Will continue and progress as tolerated 1 more visit    Total Session Time 40 and Timed code minutes 40  THERAPEUTIC EXERCISE 15 minutes and JOINT MOBILIZATION/MFR 25  minutes      Damien Parcel, PT  01/31/2024, 17:45

## 2024-02-05 ENCOUNTER — Other Ambulatory Visit: Payer: Self-pay

## 2024-02-05 ENCOUNTER — Ambulatory Visit
Admission: RE | Admit: 2024-02-05 | Discharge: 2024-02-05 | Disposition: A | Discharge status: Still In Hospital | Payer: Self-pay | Source: Ambulatory Visit | Attending: Orthopaedic Surgery | Admitting: Orthopaedic Surgery

## 2024-02-05 DIAGNOSIS — S72009A Fracture of unspecified part of neck of unspecified femur, initial encounter for closed fracture: Secondary | ICD-10-CM

## 2024-02-05 NOTE — PT Treatment (Signed)
 [facility]  Outpatient Physical Therapy  [address]  [place], [address]  [phone]  (Fax) [phone]    Physical Therapy Discharge Note    Date: 02/05/2024  Patient's Name: Corey [name]  Date of Birth: 08-17-1945  Physical Therapy Discharge    Visit #/POC: 14/15 (2x/wk)  Authorization: 14 through 06/17/24  POC Signed?: yes  POC Ends: 02/07/2024  Order Ends: open     Evaluating Physical Therapist: Damien Parcel, PT, DPT  PT diagnosis/Reason for Referral: closed L hip fracture  Next Scheduled Physician Appointment: 12/31/2023  Allergies/Contraindications: anterior, posterior, and lateral hip precautions until 8/14; fall risk, CABG x3 2023, back surgery, AAA, L knee flexion limited to 60 deg and it buckles occasionally        Subjective:  Pt reports lingering L hip soreness from golfing yesterday! He is able to walk household distances without his SC. He is IND and compliant with HEP.      Objective:  Activities as noted below.      Patient-Specific Functional Score:     Problem Score   1. golfing 3   2. Walking across grass 8   3. Navigating stairs 10   Total 21   Total score = sum of the activity scores/number of activities    Minimal detectable change (90% CI) for avg score = 2 points    Minimal detectable change (90% CI) for single activity score = 3 points                                                                                       OBJECTIVE     PROM    left   Hip Flexion 95 deg*   Hip Extension 0 deg   Hip Abduction 35 deg   Hip Adduction 0   Hip IR NT   Hip ER 30 deg*   Knee Flexion 60 deg*      ROM comments tightness noted in L hip flexors, HS, Piriformis     Strength (Manual Muscle Testing per [employer] Muscle Grading system)       left   Hip Flexion 3+   Hip Extension 4-   Hip Abduction 4-   Hip Adduction 4-   Hip IR 4-   Hip ER 4-   Knee Flexion 4   Knee Extension 4      Strength comments: pain with SLR     Posture:FEMORAL LATERAL ROTATION     TUG  15 sec with SC    EXERCISE/ACTIVITY NAME REPETITIONS RESISTANCE COMPLETED THIS DOS   L hip PROM  5'   y    Forward and retro walk      Lateral walk   100' ea         100' ea  gray t-tube      gray t-tube  y         y   L HS stretch   Standing hip flexor stretch on step  3'   y      n   Step ups L 2 x 10 6 y   Lateral step ups  L  2x 10 6 y   NuStep (super small range due to knee)  10'  level 5 y    lateral walk with cables 2 x 10 ea 10# (CGA)  n   3 way hips (small range)  2 x 10 green n   Heel raises  2 x 10 With UE support n   Standing march  2 x 10 With red tube at toes y   Golf swing to feet 2 x 10 5# n   SL RB balance 3'   n   Trigger release: L groin  x10   n   MFR: L glutes, lumbar paraspinals, HS 10'   y   SLR 10x   Y (HEP)   S/l hip abd 10x   y          Assessment: Pt tolerated treatment well with manual and stretching emphasis as he is still sore from golfing. He has made good progress towards all goals, and PT anticipates he will be able to continue building L hip strength with HEP only at this time. Plan to DC to HEP. Pt was encouraged to call back with any further needs or concerns.      Short-Term Goals: 2 Weeks   -  Patient will demonstrate improved L hip PROM to at least 0 to 90 degrees (ext-flex) to aid in ability to ambulate. (MET 01/10/2024)               -  Patient will demonstrate independence with progressive HEP to maximize gains from physical therapy. (MET 01/10/2024)               -  Patient will report max 5/10 pain to aid in completion of ADLs. (MET 01/10/2024)               -  Patient will demonstrate improved strength of L LE WFL for supine SLR to aid in functional transfers. (MET 01/10/2024)               -  Patient will wean to least restrictive assistive device with minimal to no gait deviation to aid community ambulation. (MET 01/10/2024)    Long-Term Goals: 4 Weeks                -  Patient will demonstrate improved L hip strength of at least 4/5 to aid in functional transfers.  (PROGRESSING 02/05/2024)               -  Patient will demonstrate improved TUG time of maximum 14 seconds with least restrictive assistive device to demonstrate decreased falls risk. (PROGRESSING 02/05/2024)               -  Patient will demonstrate 10 squats/sit <> stands with good form to aid in completion of ADLs. (MET 02/05/2024)               -  Patient will demonstrate improved functional ability with Patient Specific Scale Score of at least 6. (MET 02/05/2024)               -  Patient will demonstrate L hip AROM 0-90 deg (ext-flex) without pain to improve his ability to navigate stairs. (MET 02/05/2024)        Plan:  DC to HEP. All authorized visits have been utilized    Total Session Time 40 and Timed code minutes 40  THERAPEUTIC EXERCISE 25 minutes and JOINT MOBILIZATION/MFR 15 minutes    Damien Parcel,  PT  02/05/2024, 09:54

## 2024-02-06 ENCOUNTER — Encounter (RURAL_HEALTH_CENTER): Payer: Self-pay | Admitting: Family

## 2024-02-13 ENCOUNTER — Ambulatory Visit (RURAL_HEALTH_CENTER): Payer: Self-pay | Admitting: Family

## 2024-02-14 ENCOUNTER — Other Ambulatory Visit (RURAL_HEALTH_CENTER): Payer: Self-pay | Admitting: Family Medicine

## 2024-02-14 ENCOUNTER — Encounter (RURAL_HEALTH_CENTER): Payer: Self-pay | Admitting: Family Medicine

## 2024-02-14 MED ORDER — TRAMADOL 50 MG TABLET
1.0000 | ORAL_TABLET | Freq: Four times a day (QID) | ORAL | 0 refills | Status: DC | PRN
Start: 2024-02-14 — End: 2024-03-11

## 2024-03-11 ENCOUNTER — Other Ambulatory Visit (RURAL_HEALTH_CENTER): Payer: Self-pay | Admitting: Family

## 2024-03-11 ENCOUNTER — Ambulatory Visit (RURAL_HEALTH_CENTER): Payer: Self-pay | Admitting: Family

## 2024-03-11 MED ORDER — TRAMADOL 50 MG TABLET
1.0000 | ORAL_TABLET | Freq: Four times a day (QID) | ORAL | 0 refills | Status: AC | PRN
Start: 2024-03-11 — End: 2024-04-10

## 2024-04-02 ENCOUNTER — Other Ambulatory Visit: Payer: Self-pay

## 2024-04-02 ENCOUNTER — Ambulatory Visit (RURAL_HEALTH_CENTER): Payer: Self-pay | Attending: Family | Admitting: Family

## 2024-04-02 ENCOUNTER — Ambulatory Visit: Attending: Family | Admitting: Family

## 2024-04-02 ENCOUNTER — Encounter (RURAL_HEALTH_CENTER): Payer: Self-pay | Admitting: Family

## 2024-04-02 VITALS — BP 133/72 | HR 62 | Temp 97.9°F | Resp 17 | Ht 67.0 in | Wt 163.5 lb

## 2024-04-02 DIAGNOSIS — K219 Gastro-esophageal reflux disease without esophagitis: Secondary | ICD-10-CM | POA: Insufficient documentation

## 2024-04-02 DIAGNOSIS — G4733 Obstructive sleep apnea (adult) (pediatric): Secondary | ICD-10-CM | POA: Insufficient documentation

## 2024-04-02 DIAGNOSIS — I10 Essential (primary) hypertension: Secondary | ICD-10-CM | POA: Insufficient documentation

## 2024-04-02 DIAGNOSIS — Z8719 Personal history of other diseases of the digestive system: Secondary | ICD-10-CM | POA: Insufficient documentation

## 2024-04-02 DIAGNOSIS — E785 Hyperlipidemia, unspecified: Secondary | ICD-10-CM | POA: Insufficient documentation

## 2024-04-02 DIAGNOSIS — F411 Generalized anxiety disorder: Secondary | ICD-10-CM | POA: Insufficient documentation

## 2024-04-02 DIAGNOSIS — E559 Vitamin D deficiency, unspecified: Secondary | ICD-10-CM | POA: Insufficient documentation

## 2024-04-02 DIAGNOSIS — Z131 Encounter for screening for diabetes mellitus: Secondary | ICD-10-CM | POA: Insufficient documentation

## 2024-04-02 DIAGNOSIS — F17200 Nicotine dependence, unspecified, uncomplicated: Secondary | ICD-10-CM | POA: Insufficient documentation

## 2024-04-02 DIAGNOSIS — J438 Other emphysema: Secondary | ICD-10-CM | POA: Insufficient documentation

## 2024-04-02 DIAGNOSIS — Z23 Encounter for immunization: Secondary | ICD-10-CM | POA: Insufficient documentation

## 2024-04-02 DIAGNOSIS — M8000XA Age-related osteoporosis with current pathological fracture, unspecified site, initial encounter for fracture: Secondary | ICD-10-CM | POA: Insufficient documentation

## 2024-04-02 MED ORDER — ATORVASTATIN 40 MG TABLET: 40.0000 mg | ORAL_TABLET | Freq: Every day | ORAL | 1 refills | Status: DC

## 2024-04-02 MED ORDER — CALTRATE PLUS D  600 MG (CARBONATE)-20 MCG (800 UNIT) CHEWABLE TABLET: 1.0000 | CHEWABLE_TABLET | Freq: Every day | ORAL | 0 refills | Status: DC

## 2024-04-02 MED ORDER — LOSARTAN 25 MG TABLET: 25.0000 mg | ORAL_TABLET | Freq: Two times a day (BID) | ORAL | 0 refills | Status: DC

## 2024-04-02 MED ORDER — EZETIMIBE 10 MG TABLET: 10.0000 mg | ORAL_TABLET | Freq: Every evening | ORAL | 0 refills | Status: DC

## 2024-04-02 MED ORDER — METOPROLOL SUCCINATE ER 25 MG TABLET,EXTENDED RELEASE 24 HR: 12.5000 mg | ORAL_TABLET | Freq: Every day | ORAL | 1 refills | Status: DC

## 2024-04-02 MED ORDER — SERTRALINE 100 MG TABLET: 100.0000 mg | ORAL_TABLET | Freq: Every evening | ORAL | 1 refills | Status: DC

## 2024-04-02 MED ORDER — FAMOTIDINE 40 MG TABLET: 40.0000 mg | ORAL_TABLET | Freq: Two times a day (BID) | ORAL | 1 refills | Status: DC

## 2024-04-02 NOTE — Patient Instructions (Signed)
 VISIT SUMMARY:  Today, you came in for a routine visit. We discussed several health concerns, including your cognitive function, osteoporosis, urinary symptoms, blood pressure, cholesterol, acid reflux, and mental health. We also reviewed your current medications and made some adjustments to better manage your symptoms.    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 We will perform a mini mental status exam to monitor your cognitive function and may refer you to a neurologist if needed.    -OSTEOPOROSIS WITH CURRENT PATHOLOGICAL FRACTURE: Osteoporosis is a condition where your bones become weak and are more likely to break. You are currently receiving Prolia  injections to help strengthen your bones. It's important to continue these injections every six months and ensure you are getting enough calcium.    -BENIGN PROSTATIC HYPERPLASIA WITH LOWER URINARY TRACT SYMPTOMS: This condition involves an enlarged prostate that can cause urinary symptoms like frequent urination. We will discuss your symptoms with a urologist to see if your current medications need to be adjusted.    -ESSENTIAL HYPERTENSION: Your blood pressure is well-controlled with your current medications. Please continue taking losartan  and metoprolol  as prescribed.    -HYPERLIPIDEMIA: This condition means you have high cholesterol levels. You are currently taking atorvastatin  and Zetia  to manage this. We will continue with these medications and send a refill for atorvastatin .    -GASTROESOPHAGEAL REFLUX DISEASE: This condition causes acid from your stomach to flow back into your esophagus, leading to heartburn. We will switch your medication from omeprazole  to famotidine  to reduce long-term risks. You may experience some rebound indigestion for about a month, which can be managed with Tums.    -DEPRESSION AND ANXIETY: You are currently  taking sertraline  to manage your depression and anxiety. We will continue this medication and send a refill.    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 Smoking can have various negative health effects, including dry mouth.    -GENERAL HEALTH MAINTENANCE: You received a Capvaxin shot for pneumonia prevention. It's important to have regular eye check-ups, especially because of your tamsulosin  use.    INSTRUCTIONS:  We will perform a mini mental status exam to monitor your cognitive function. Please continue with your Prolia  injections every six months and ensure adequate calcium intake. Discuss your urinary symptoms with a urologist to see if your medications need adjustment. Continue taking your current medications for blood pressure, cholesterol, and mental health. Switch from omeprazole  to famotidine  for acid reflux and manage any rebound indigestion with Tums. Make sure to have regular ophthalmology visits. We will send refills for atorvastatin  and sertraline .

## 2024-04-02 NOTE — Progress Notes (Signed)
 FAMILY MEDICINE, [facility] FAMILY MEDICINE [facility]  [address]  [place] [address]  Operated by [facility]     Name: Corey [name] MRN:  [id number]   Date of Birth: 17-Oct-1945 Age: 78 y.o.   Date: 04/02/2024  Time: 09:35     Provider: Greig LITTIE Mills, APRN, CNP    Reason for visit: Follow Up        History of Present Illness:   History of Present Illness  Corey [name] is a 78 year old male who presents for a routine visit.    He has a history of a recent hip fracture and osteoporosis. His wife is concerned about potential falls and that he is experiencing some occasional confusion, such as not recognizing his surroundings. He acknowledges occasional confusion but denies getting lost.    He experiences back pain that prevents him from sleeping in bed, leading him to sleep in a recliner. He sleeps well in the recliner but acknowledges discomfort from his bed. He is taking tramadol  more frequently than before but not daily.    He is taking benzatropine for teeth grinding, which his daughter-in-law prescribes, and it has been effective. He is concerned about potential confusion related to his medications.    He is taking tamsulosin  for urinary symptoms but reports frequent urination and urgency. He takes it every other night. He is also on Mirabegron  for urinary symptoms.    He receives Prolia  injections every six months for osteoporosis and continues calcium supplementation. No adverse effects from the injections.    He is on multiple medications including atorvastatin , losartan , metoprolol , omeprazole , Zoloft , and aspirin . He is also taking Zetia  for cholesterol and Pepcid  for indigestion.    He smokes cigarettes and experiences dry mouth after smoking.    No chest pain and no recent changes in cardiac status. He has a known bad valve and is monitored for any worsening symptoms.    He has recently received an RSV and flu vaccine.       Patient Active Problem List     Diagnosis Date Noted    Osteoporosis with current pathological fracture 01/22/2024    Head injury due to trauma 12/15/2023    History of hip fracture 12/12/2023    Hip fracture 12/12/2023    Right knee pain 08/06/2023    Gynecomastia, male 03/19/2023    Mass of right breast 03/06/2023    AAA (abdominal aortic aneurysm) 02/13/2023    Gout attack 01/01/2023    Generalized anxiety disorder 06/21/2022    Lower urinary tract symptoms due to benign prostatic hyperplasia 06/21/2022    Basal cell carcinoma of skin 06/21/2022    Other emphysema 06/14/2022     CT LUNG SCREENING LDCT - There is paraseptal emphysematous change at the lung apices which is considered mild (Imaging)      Degenerative disease of nervous system, unspecified 04/14/2022     CT BRAIN WO IV CONTRAST - CSF Spaces Mild generalized cerebral atrophy (Imaging)      Coronary arteriosclerosis 03/15/2022    History of constipation 03/15/2022    Positive colorectal cancer screening using Cologuard test 03/15/2022    Skin cancer 12/21/2021    Low back pain 12/21/2021    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    Hypertensive disorder 12/21/2021    Anxiety 12/21/2021    Osteoarthritis 12/21/2021    Vitamin D  deficiency, unspecified 12/21/2021    Benign prostatic hyperplasia 12/21/2021    Current smoker 12/21/2021  Hyperlipidemia 08/29/2021    Heart murmur 08/29/2021    Obstructive sleep apnea syndrome 08/29/2021    Right carotid bruit 08/29/2021    Gastroesophageal reflux disease 08/29/2021    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     [facility]  Dr Skipper      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       Historical Data    Past Medical History:  Past Medical History:   Diagnosis Date    Abnormal laboratory test     Abnormal prostate specific antigen     Anxiety     Arteriosclerotic vascular disease     Atypical chest pain     BPH (benign prostatic hyperplasia)     Cancer (CMS HCC)     skin    Coronary artery disease     Depression      Diarrhea     Esophageal reflux     Fatigue 08/29/2021    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    [place]  Dr Skipper    Hypercholesterolemia     Hypertension     IBS (irritable bowel syndrome)     Left foot pain 12/21/2021    Leukocytosis     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    Narcolepsy     Neck pain 12/21/2021    Nephritis 12/21/2021    OSA (obstructive sleep apnea)     Osteoarthritis     Paresthesia 12/21/2021    Previous back surgery 12/21/2021    Right carotid bruit     Right hip pain 12/21/2021    Thigh pain 12/21/2021    Thyroid nodule     Weight loss, non-intentional 12/21/2021     Past Surgical History:  Past Surgical History:   Procedure Laterality Date    CARDIAC CATHETERIZATION  07/13/2021    [id number]    Dr Garnette Ward    DENTAL SURGERY      HX BACK SURGERY      HX COLONOSCOPY  2019    had done at [facility]    HX CORONARY ARTERY BYPASS GRAFT  08/03/2021    Dr Skipper [facility]    HX HERNIA REPAIR      HX LAP CHOLECYSTECTOMY      ORTHOPEDIC SURGERY      PROSTATE SURGERY  10/2022    Dr. Emilio     Allergies:  Allergies[1]  Medications:  Current Outpatient Medications   Medication Sig    aspirin  (ECOTRIN) 81 mg Oral Tablet, Delayed Release (E.C.) Take 1 Tablet (81 mg total) by mouth Daily    atorvastatin  (LIPITOR) 40 mg Oral Tablet Take 1 Tablet (40 mg total) by mouth Daily    benztropine  (COGENTIN ) 1 mg Oral Tablet Take 1 Tablet (1 mg total) by mouth Daily    calcium carbonate-vitamin D3 (CALTRATE 600 PLUS D) 600 mg-20 mcg (800 unit) Oral Tablet, Chewable Chew 1 Tablet Daily for 90 days Indications: prevention of a low amount of calcium in the blood    coffee/theanine/superoxide dis (NEURIVA DE-STRESS ORAL) Take 1 Capsule by mouth Daily    colchicine  0.6 mg Oral Tablet Take 1 Tablet (0.6 mg total) by mouth Twice daily    cyclobenzaprine  (FLEXERIL ) 10 mg Oral Tablet Take 1 Tablet (10 mg total) by mouth Every 8 hours as needed    denosumab  (PROLIA ) 60 mg/mL  Subcutaneous Syringe subQ injection Inject 1 mL (60 mg total) under the skin Every  6 months Indications: osteoporosis in male patient    ezetimibe  (ZETIA ) 10 mg Oral Tablet Take 1 Tablet (10 mg total) by mouth Every evening Indications: High Trigylcerides.    famotidine  (PEPCID ) 40 mg Oral Tablet Take 1 Tablet (40 mg total) by mouth Twice daily for 180 days    losartan  (COZAAR ) 25 mg Oral Tablet Take 1 Tablet (25 mg total) by mouth Twice daily Indications: high blood pressure    metoprolol  succinate (TOPROL -XL) 25 mg Oral Tablet Sustained Release 24 hr Take 0.5 Tablets (12.5 mg total) by mouth Daily Wean to 1/2 tablet, take whole tablet if heart rate is greater than 100    mirabegron  (MYRBETRIQ ) 25 mg Oral Tablet Sustained Release 24 hr Take 1 Tablet (25 mg total) by mouth Daily    nitroGLYCERIN  (NITROSTAT ) 0.4 mg Sublingual Tablet, Sublingual DISSOLVE 1 TAB UNDER TONGUE EVERY 5 MINUTES AS NEEDED FOR CHEST PAIN. DO NOT EXCEED 3 DOSES IN 15 MINUTES.    polyethylene glycol (MIRALAX ) 17 gram Oral Powder in Packet Take 1 Packet (17 g total) by mouth Daily    sertraline  (ZOLOFT ) 100 mg Oral Tablet Take 1 Tablet (100 mg total) by mouth Every night Indications: repeated episodes of anxiety    tamsulosin  (FLOMAX ) 0.4 mg Oral Capsule Take 1 Capsule (0.4 mg total) by mouth Every evening after dinner for 90 days Indications: enlarged prostate with urination problem    traMADoL  (ULTRAM ) 50 mg Oral Tablet Take 1 Tablet (50 mg total) by mouth Every 6 hours as needed for Pain for up to 30 days Indications: pain    vitamin B complex Oral Tablet Take 1 Tablet by mouth Daily     Family History:  Family Medical History:       Problem Relation (Age of Onset)    Brain cancer Father    Heart Disease Mother    Hypertension (High Blood Pressure) Mother    No Known Problems Sister, Brother, Maternal Grandmother, Maternal Grandfather, Paternal Grandmother, Paternal Grandfather, Daughter, Son, Maternal Aunt, Maternal Uncle, Paternal Aunt,  Paternal Uncle, Half-Brother, Half-Sister, Other            Social History:  Social History     Socioeconomic History    Marital status: Married     Spouse name: Corey    Number of children: 2    Years of education: 12+    Highest education level: Bachelor's degree (e.g., BA, AB, BS)   Tobacco Use    Smoking status: Every Day     Current packs/day: 1.00     Average packs/day: 1 pack/day for 56.8 years (56.8 ttl pk-yrs)     Types: Cigarettes     Start date: 1969    Smokeless tobacco: Never    Tobacco comments:     Down to half a pack a day   Vaping Use    Vaping status: Never Used   Substance and Sexual Activity    Alcohol  use: Yes     Alcohol /week: 3.0 standard drinks of alcohol      Types: 3 Cans of beer per week     Comment: occasionally    Drug use: Never     Social Determinants of Health     Financial Resource Strain: Low Risk (06/15/2022)    Financial Resource Strain     SDOH Financial: No   Transportation Needs: Low Risk (06/15/2022)    Transportation Needs     SDOH Transportation: No   Social Connections: Low Risk (12/12/2023)    Social  Connections     SDOH Social Isolation: 5 or more times a week   Intimate Partner Violence: Low Risk (06/15/2022)    Intimate Partner Violence     SDOH Domestic Violence: No   Housing Stability: Low Risk (06/15/2022)    Housing Stability     SDOH Housing Situation: I have housing.     SDOH Housing Worry: No           Review of Systems:  Any pertinent Review of Systems as addressed in the HPI above.    Physical Exam:  Vital Signs:  Vitals:    04/02/24 0926   BP: 133/72   Pulse: 62   Resp: 17   Temp: 36.6 C (97.9 F)   TempSrc: Temporal   SpO2: 95%   Weight: 74.2 kg (163 lb 8 oz)   Height: 1.702 m (5' 7)   BMI: 25.61     Physical Exam  Vitals reviewed.   Constitutional:       Appearance: Normal appearance. He is well-developed, well-groomed and normal weight. He is not ill-appearing.   HENT:      Head: Normocephalic and atraumatic.      Right Ear: Hearing normal.      Left Ear: Hearing  normal.      Nose: Nose normal.      Mouth/Throat:      Lips: Pink.      Mouth: Mucous membranes are moist.   Eyes:      General: No scleral icterus.     Conjunctiva/sclera: Conjunctivae normal.   Cardiovascular:      Rate and Rhythm: Normal rate and regular rhythm.      Pulses: Normal pulses.      Heart sounds: Normal heart sounds, S1 normal and S2 normal. No murmur heard.  Pulmonary:      Effort: Pulmonary effort is normal.      Breath sounds: Normal breath sounds. No decreased breath sounds, wheezing, rhonchi or rales.   Abdominal:      Palpations: Abdomen is soft.   Musculoskeletal:         General: No swelling.      Cervical back: Neck supple.   Skin:     General: Skin is warm.      Coloration: Skin is not cyanotic or pale.      Nails: There is no clubbing.   Neurological:      General: No focal deficit present.      Mental Status: He is alert and oriented to person, place, and time. Mental status is at baseline.      Cranial Nerves: Cranial nerves 2-12 are intact.      Motor: Motor function is intact.      Coordination: Coordination is intact.      Gait: Gait is intact.   Psychiatric:         Mood and Affect: Mood and affect normal.         Speech: Speech normal.         Behavior: Behavior normal. Behavior is cooperative.         Results:  Lab Results   Component Value Date    VITAMIND25 46.37 04/02/2024    VITAMIND25 42.88 01/02/2024    VITAMIND25 36.40 10/08/2023      CBC  Diff   Lab Results   Component Value Date/Time    WBC 9.2 04/02/2024 01:42 PM    HGB 14.2 04/02/2024 01:42 PM    HCT 41.4 04/02/2024 01:42 PM  PLTCNT 293 04/02/2024 01:42 PM    RBC 4.59 04/02/2024 01:42 PM    MCV 90.3 04/02/2024 01:42 PM    MCHC 34.3 04/02/2024 01:42 PM    MCH 30.9 04/02/2024 01:42 PM    RDW 14.3 04/02/2024 01:42 PM    MPV 9.6 04/02/2024 01:42 PM    Lab Results   Component Value Date/Time    PMNS 58 04/02/2024 01:42 PM    LYMPHOCYTES 33 04/02/2024 01:42 PM    EOSINOPHIL 1 04/02/2024 01:42 PM    MONOCYTES 7 04/02/2024  01:42 PM    BASOPHILS 1 04/02/2024 01:42 PM    BASOPHILS 0.10 04/02/2024 01:42 PM    PMNABS 5.40 04/02/2024 01:42 PM    LYMPHSABS 3.00 04/02/2024 01:42 PM    EOSABS 0.10 04/02/2024 01:42 PM    MONOSABS 0.70 04/02/2024 01:42 PM           COMPREHENSIVE METABOLIC PANEL  Lab Results   Component Value Date    SODIUM 141 04/02/2024    POTASSIUM 4.2 04/02/2024    CHLORIDE 107 04/02/2024    CO2 27 04/02/2024    ANIONGAP 7 04/02/2024    BUN 14 04/02/2024    CREATININE 1.08 04/02/2024    GLUCOSENF 74 04/02/2024    CALCIUM 9.3 04/02/2024    ALBUMIN 4.5 04/02/2024    TOTALPROTEIN 7.4 04/02/2024    ALKPHOS 89 04/02/2024    AST 24 04/02/2024    ALT 21 04/02/2024    GFR 71 04/02/2024                 Lab Results   Component Value Date    CHOLESTEROL 109 04/02/2024    TRIG 245 (H) 04/02/2024    HDLCHOL 22 (L) 04/02/2024    LDLCHOL 38 04/02/2024    VLDLCAL 49 04/02/2024    CHOLHDLRATIO 5.0 04/02/2024      Lab Results   Component Value Date    TSH 1.546 04/02/2024     Lab Results   Component Value Date    HA1C 5.3 04/02/2024            ICD-10-CM    1. Primary hypertension  I10 CBC/DIFF     COMPREHENSIVE METABOLIC PANEL, NON-FASTING     HGA1C (HEMOGLOBIN A1C WITH EST AVG GLUCOSE)     LIPID PANEL     THYROID STIMULATING HORMONE (SENSITIVE TSH)     VITAMIN D  25 TOTAL      2. Hyperlipidemia, unspecified hyperlipidemia type  E78.5 CBC/DIFF     COMPREHENSIVE METABOLIC PANEL, NON-FASTING     HGA1C (HEMOGLOBIN A1C WITH EST AVG GLUCOSE)     LIPID PANEL     THYROID STIMULATING HORMONE (SENSITIVE TSH)     VITAMIN D  25 TOTAL      3. Other emphysema  J43.8 CBC/DIFF     COMPREHENSIVE METABOLIC PANEL, NON-FASTING     HGA1C (HEMOGLOBIN A1C WITH EST AVG GLUCOSE)     LIPID PANEL     THYROID STIMULATING HORMONE (SENSITIVE TSH)     VITAMIN D  25 TOTAL      4. Current smoker  F17.200 CBC/DIFF     COMPREHENSIVE METABOLIC PANEL, NON-FASTING     HGA1C (HEMOGLOBIN A1C WITH EST AVG GLUCOSE)     LIPID PANEL     THYROID STIMULATING HORMONE (SENSITIVE TSH)      VITAMIN D  25 TOTAL      5. Gastroesophageal reflux disease without esophagitis  K21.9 CBC/DIFF     COMPREHENSIVE METABOLIC PANEL, NON-FASTING  HGA1C (HEMOGLOBIN A1C WITH EST AVG GLUCOSE)     LIPID PANEL     THYROID STIMULATING HORMONE (SENSITIVE TSH)     VITAMIN D  25 TOTAL      6. Vitamin D  deficiency, unspecified  E55.9 CBC/DIFF     COMPREHENSIVE METABOLIC PANEL, NON-FASTING     HGA1C (HEMOGLOBIN A1C WITH EST AVG GLUCOSE)     LIPID PANEL     THYROID STIMULATING HORMONE (SENSITIVE TSH)     VITAMIN D  25 TOTAL      7. Osteoporosis with current pathological fracture  M80.00XA CBC/DIFF     COMPREHENSIVE METABOLIC PANEL, NON-FASTING     HGA1C (HEMOGLOBIN A1C WITH EST AVG GLUCOSE)     LIPID PANEL     THYROID STIMULATING HORMONE (SENSITIVE TSH)     VITAMIN D  25 TOTAL      8. Obstructive sleep apnea syndrome  G47.33 CBC/DIFF     COMPREHENSIVE METABOLIC PANEL, NON-FASTING     HGA1C (HEMOGLOBIN A1C WITH EST AVG GLUCOSE)     LIPID PANEL     THYROID STIMULATING HORMONE (SENSITIVE TSH)     VITAMIN D  25 TOTAL      9. History of constipation  Z87.19 CBC/DIFF     COMPREHENSIVE METABOLIC PANEL, NON-FASTING     HGA1C (HEMOGLOBIN A1C WITH EST AVG GLUCOSE)     LIPID PANEL     THYROID STIMULATING HORMONE (SENSITIVE TSH)     VITAMIN D  25 TOTAL      10. Generalized anxiety disorder  F41.1 CBC/DIFF     COMPREHENSIVE METABOLIC PANEL, NON-FASTING     HGA1C (HEMOGLOBIN A1C WITH EST AVG GLUCOSE)     LIPID PANEL     THYROID STIMULATING HORMONE (SENSITIVE TSH)     VITAMIN D  25 TOTAL      11. Diabetes mellitus screening  Z13.1 CBC/DIFF     COMPREHENSIVE METABOLIC PANEL, NON-FASTING     HGA1C (HEMOGLOBIN A1C WITH EST AVG GLUCOSE)     LIPID PANEL     THYROID STIMULATING HORMONE (SENSITIVE TSH)     VITAMIN D  25 TOTAL           Assessment/Plan:  Assessment & Plan  Cognitive impairment, possible dementia  Reports confusion and memory issues.  Recently, he woke up confused thinking invited company within the home, he also recently lost some keys.  Differential includes Alzheimer's, other dementias and medication side effects. Tramadol  and benzatropine may contribute to confusion.  - Perform mini mental status exam.  He had a 22/30 indicating mild cognitive impairment.  He has struggled mostly with recall.  - will continue to Monitor cognitive function.  - we discussed using the tramadol  only will arthritis strength Tylenol  does not meet help his pain.  And to make sure the confusion does not increase or occur when on the tramadol .  - Consider neurology referral if cognitive function continues to make noticeable decline.    Osteoporosis with current pathological fracture  Currently on Prolia  injections. Emphasized importance of continuing Prolia  to prevent rapid return of osteoporosis. Adequate calcium intake and monitoring calcium levels before Prolia  administration are necessary.  - Continue Prolia  injections every six months.  - Ensure adequate calcium intake with supplements.  - Monitor calcium levels before Prolia  administration.  -check a vitamin-D level    Benign prostatic hyperplasia with lower urinary tract symptoms  Experiencing increased urinary frequency despite tamsulosin  and Robetric. Tamsulosin  may not be effective or appropriate.  - he is scheduled to see his urologist in the  upcoming month, encouraged to discuss urinary symptoms with urologist.  - Consider adjusting or discontinuing tamsulosin  based on urologist's recommendation.    Essential hypertension  Blood pressure is well-controlled on current medication regimen.  - Continue current antihypertensive medications (losartan , metoprolol ).  -losartan  25 mg 2 times a day  -metoprolol  12.5 mg daily  -updated CMP, CBC    Hyperlipidemia  On atorvastatin  and Zetia  for cholesterol management.  - Continue atorvastatin  40 mg daily and Zetia  10 mg daily.  - Send atorvastatin  refill.  -get a fasting lipid level    Gastroesophageal reflux disease  Currently on omeprazole . Discussed potential risks of  long-term PPI use. Plan to transition to Pepcid  to mitigate risks.  - Discontinue omeprazole .  - Start famotidine  40 mg twice daily.  - Expect rebound indigestion for about a month, manage with Tums as needed.    Depression and anxiety  Currently managed with sertraline .  - Continue sertraline  100 mg nightly.  - Send sertraline  refill.    Nicotine  dependence  Continues to smoke.    OSA with CPAP  He does well with compliance.  -continue to monitor    General Health Maintenance  Administered Capvaxin for pneumonia prevention. Discussed importance of regular ophthalmology visits due to tamsulosin  use.  - Administer Capvaxin for pneumonia prevention.  - Encourage regular ophthalmology visits.  - will screen for type 2 diabetes             Orders Placed This Encounter    Capvaxive (PCV 21) (Admin)    CBC/DIFF    COMPREHENSIVE METABOLIC PANEL, NON-FASTING    HGA1C (HEMOGLOBIN A1C WITH EST AVG GLUCOSE)    LIPID PANEL    THYROID STIMULATING HORMONE (SENSITIVE TSH)    VITAMIN D  25 TOTAL    CBC WITH DIFF    atorvastatin  (LIPITOR) 40 mg Oral Tablet    losartan  (COZAAR ) 25 mg Oral Tablet    metoprolol  succinate (TOPROL -XL) 25 mg Oral Tablet Sustained Release 24 hr    famotidine  (PEPCID ) 40 mg Oral Tablet    ezetimibe  (ZETIA ) 10 mg Oral Tablet    sertraline  (ZOLOFT ) 100 mg Oral Tablet    calcium carbonate-vitamin D3 (CALTRATE 600 PLUS D) 600 mg-20 mcg (800 unit) Oral Tablet, Chewable         Return in about 3 months (around 07/03/2024) for Chronic Disease Management.    Ronald Vinsant L Rashidi Loh APRN, FNP-C    This note was created with assistance from [employer] via capture of conversational audio.  Consent was obtained from the patient prior to recording.      Portions of this note may be dictated using voice recognition software or a dictation service. Variances in spelling and vocabulary are possible and unintentional. Not all errors are caught/corrected. Please notify the Corey if any discrepancies are noted or if the meaning of any  statement is not clear.          [1]   Allergies  Allergen Reactions    Penicillins  Other Adverse Reaction (Add comment) and Rash     Childhood allergy, unknown reaction

## 2024-04-02 NOTE — Nursing Note (Signed)
 Patient here today for 3 month follow-up and medication refills.

## 2024-04-03 ENCOUNTER — Ambulatory Visit (RURAL_HEALTH_CENTER): Payer: Self-pay | Admitting: Family

## 2024-04-03 LAB — CBC WITH DIFF
BASOPHIL #: 0.1 x10ˆ3/uL (ref 0.00–0.10)
BASOPHIL %: 1 % (ref 0–1)
EOSINOPHIL #: 0.1 x10ˆ3/uL (ref 0.00–0.60)
EOSINOPHIL %: 1 % (ref 1–8)
HCT: 41.4 % (ref 36.7–47.1)
HGB: 14.2 g/dL (ref 12.5–16.3)
LYMPHOCYTE #: 3 x10ˆ3/uL (ref 1.00–3.00)
LYMPHOCYTE %: 33 % (ref 15–43)
MCH: 30.9 pg (ref 23.8–33.4)
MCHC: 34.3 g/dL (ref 32.5–36.3)
MCV: 90.3 fL (ref 73.0–96.2)
MONOCYTE #: 0.7 x10ˆ3/uL (ref 0.30–1.10)
MONOCYTE %: 7 % (ref 6–14)
MPV: 9.6 fL (ref 7.4–11.4)
NEUTROPHIL #: 5.4 x10ˆ3/uL (ref 1.85–7.84)
NEUTROPHIL %: 58 % (ref 44–74)
PLATELETS: 293 x10ˆ3/uL (ref 140–440)
RBC: 4.59 x10ˆ6/uL (ref 4.06–5.63)
RDW: 14.3 % (ref 12.1–16.2)
WBC: 9.2 x10ˆ3/uL (ref 3.6–10.2)

## 2024-04-03 LAB — COMPREHENSIVE METABOLIC PANEL, NON-FASTING
ALBUMIN/GLOBULIN RATIO: 1.6 — ABNORMAL HIGH (ref 0.8–1.4)
ALBUMIN: 4.5 g/dL (ref 3.5–5.7)
ALKALINE PHOSPHATASE: 89 U/L (ref 34–104)
ALT (SGPT): 21 U/L (ref 7–52)
ANION GAP: 7 mmol/L (ref 4–13)
AST (SGOT): 24 U/L (ref 13–39)
BILIRUBIN TOTAL: 0.6 mg/dL (ref 0.3–1.0)
BUN/CREA RATIO: 13 (ref 6–22)
BUN: 14 mg/dL (ref 7–25)
CALCIUM, CORRECTED: 8.9 mg/dL (ref 8.9–10.8)
CALCIUM: 9.3 mg/dL (ref 8.6–10.3)
CHLORIDE: 107 mmol/L (ref 98–107)
CO2 TOTAL: 27 mmol/L (ref 21–31)
CREATININE: 1.08 mg/dL (ref 0.60–1.30)
ESTIMATED GFR: 71 mL/min/1.73mˆ2 (ref >59)
GLOBULIN: 2.9 (ref 2.0–3.5)
GLUCOSE: 74 mg/dL (ref 74–109)
OSMOLALITY, CALCULATED: 280 mosm/kg (ref 270–290)
POTASSIUM: 4.2 mmol/L (ref 3.5–5.1)
PROTEIN TOTAL: 7.4 g/dL (ref 6.4–8.9)
SODIUM: 141 mmol/L (ref 136–145)

## 2024-04-03 LAB — HGA1C (HEMOGLOBIN A1C WITH EST AVG GLUCOSE): HEMOGLOBIN A1C: 5.3 % (ref 4.0–6.0)

## 2024-04-03 LAB — LIPID PANEL
CHOL/HDL RATIO: 5
CHOLESTEROL: 109 mg/dL (ref <200)
HDL CHOL: 22 mg/dL — ABNORMAL LOW (ref >=40)
LDL CALC: 38 mg/dL (ref 0–100)
TRIGLYCERIDES: 245 mg/dL — ABNORMAL HIGH (ref <=150)
VLDL CALC: 49 mg/dL (ref 0–50)

## 2024-04-03 LAB — THYROID STIMULATING HORMONE (SENSITIVE TSH): TSH: 1.546 u[IU]/mL (ref 0.450–5.330)

## 2024-04-03 LAB — VITAMIN D 25 TOTAL: VITAMIN D 25, TOTAL: 46.37 ng/mL (ref 30.00–100.00)

## 2024-04-27 ENCOUNTER — Ambulatory Visit (RURAL_HEALTH_CENTER): Payer: Self-pay | Admitting: Family

## 2024-06-09 ENCOUNTER — Ambulatory Visit (RURAL_HEALTH_CENTER): Payer: Self-pay | Admitting: Family

## 2024-06-10 ENCOUNTER — Other Ambulatory Visit (RURAL_HEALTH_CENTER): Payer: Self-pay | Admitting: Family

## 2024-06-10 MED ORDER — TRAMADOL 50 MG TABLET: 1.0000 | ORAL_TABLET | Freq: Four times a day (QID) | ORAL | 0 refills | Status: AC | PRN

## 2024-06-10 NOTE — Progress Notes (Signed)
 Refill request    06/09/2024 - Patient Call: Trudy Remak, MA and You  ([facility])             View All Conversations on this Encounter    06/09/24  4:34 PM  Trudy Remak, MA routed this conversation to Me (Selected Message)  Priscilla Vaughan Constant to [place], [place]  DS      06/09/24  4:33 PM  Patient called and states that he is needing refills on his tramodal please and thank you.

## 2024-07-03 ENCOUNTER — Other Ambulatory Visit: Payer: Self-pay

## 2024-07-03 ENCOUNTER — Encounter (RURAL_HEALTH_CENTER): Payer: Self-pay | Admitting: Family

## 2024-07-03 ENCOUNTER — Ambulatory Visit (RURAL_HEALTH_CENTER): Payer: Self-pay | Attending: Family | Admitting: Family

## 2024-07-03 VITALS — BP 120/62 | HR 64 | Temp 97.6°F | Resp 17 | Ht 67.0 in | Wt 166.1 lb

## 2024-07-03 DIAGNOSIS — F411 Generalized anxiety disorder: Secondary | ICD-10-CM | POA: Insufficient documentation

## 2024-07-03 DIAGNOSIS — N6320 Unspecified lump in the left breast, unspecified quadrant: Secondary | ICD-10-CM | POA: Insufficient documentation

## 2024-07-03 DIAGNOSIS — G4733 Obstructive sleep apnea (adult) (pediatric): Secondary | ICD-10-CM | POA: Insufficient documentation

## 2024-07-03 DIAGNOSIS — K219 Gastro-esophageal reflux disease without esophagitis: Secondary | ICD-10-CM | POA: Insufficient documentation

## 2024-07-03 DIAGNOSIS — E785 Hyperlipidemia, unspecified: Secondary | ICD-10-CM | POA: Insufficient documentation

## 2024-07-03 DIAGNOSIS — I10 Essential (primary) hypertension: Secondary | ICD-10-CM | POA: Insufficient documentation

## 2024-07-03 DIAGNOSIS — M8000XA Age-related osteoporosis with current pathological fracture, unspecified site, initial encounter for fracture: Secondary | ICD-10-CM | POA: Insufficient documentation

## 2024-07-03 DIAGNOSIS — J438 Other emphysema: Secondary | ICD-10-CM | POA: Insufficient documentation

## 2024-07-03 DIAGNOSIS — F17200 Nicotine dependence, unspecified, uncomplicated: Secondary | ICD-10-CM | POA: Insufficient documentation

## 2024-07-03 DIAGNOSIS — Z79899 Other long term (current) drug therapy: Secondary | ICD-10-CM | POA: Insufficient documentation

## 2024-07-03 DIAGNOSIS — L309 Dermatitis, unspecified: Secondary | ICD-10-CM | POA: Insufficient documentation

## 2024-07-03 DIAGNOSIS — R413 Other amnesia: Secondary | ICD-10-CM | POA: Insufficient documentation

## 2024-07-03 DIAGNOSIS — N62 Hypertrophy of breast: Secondary | ICD-10-CM | POA: Insufficient documentation

## 2024-07-03 DIAGNOSIS — N400 Enlarged prostate without lower urinary tract symptoms: Secondary | ICD-10-CM | POA: Insufficient documentation

## 2024-07-03 DIAGNOSIS — E559 Vitamin D deficiency, unspecified: Secondary | ICD-10-CM | POA: Insufficient documentation

## 2024-07-03 DIAGNOSIS — F17210 Nicotine dependence, cigarettes, uncomplicated: Secondary | ICD-10-CM | POA: Insufficient documentation

## 2024-07-03 MED ORDER — METOPROLOL SUCCINATE ER 25 MG TABLET,EXTENDED RELEASE 24 HR: 12.5000 mg | ORAL_TABLET | Freq: Every day | ORAL | 1 refills | Status: AC

## 2024-07-03 MED ORDER — BETAMETHASONE VALERATE 0.1 % TOPICAL CREAM: TOPICAL_CREAM | Freq: Two times a day (BID) | CUTANEOUS | 1 refills | Status: AC

## 2024-07-03 MED ORDER — CALTRATE PLUS D  600 MG (CARBONATE)-20 MCG (800 UNIT) CHEWABLE TABLET: 1.0000 | CHEWABLE_TABLET | Freq: Every day | ORAL | 0 refills | Status: AC

## 2024-07-03 MED ORDER — FAMOTIDINE 40 MG TABLET: 40.0000 mg | ORAL_TABLET | Freq: Two times a day (BID) | ORAL | 1 refills | Status: AC

## 2024-07-03 MED ORDER — TAMSULOSIN 0.4 MG CAPSULE: 0.4000 mg | ORAL_CAPSULE | Freq: Every evening | ORAL | 0 refills | Status: AC

## 2024-07-03 MED ORDER — LOSARTAN 25 MG TABLET: 25.0000 mg | ORAL_TABLET | Freq: Two times a day (BID) | ORAL | 0 refills | Status: AC

## 2024-07-03 MED ORDER — ATORVASTATIN 40 MG TABLET: 40.0000 mg | ORAL_TABLET | Freq: Every day | ORAL | 1 refills | Status: AC

## 2024-07-03 MED ORDER — SERTRALINE 100 MG TABLET: 100.0000 mg | ORAL_TABLET | Freq: Every evening | ORAL | 1 refills | Status: AC

## 2024-07-03 MED ORDER — EZETIMIBE 10 MG TABLET: 10.0000 mg | ORAL_TABLET | Freq: Every evening | ORAL | 0 refills | Status: AC

## 2024-07-03 NOTE — Nursing Note (Signed)
 Patient here today for 3 month follow-up and medication refills.

## 2024-07-03 NOTE — Progress Notes (Signed)
 FAMILY MEDICINE, [facility] FAMILY MEDICINE [facility]  [address]  [place] [address]  Operated by [facility]     Name: Corey [name] MRN:  [id number]   Date of Birth: 1946-04-26 Age: 79 y.o.   Date: 07/03/2024  Time: 16:16     Provider: Greig LITTIE Mills, APRN, CNP    Reason for visit: Follow Up        History of Present Illness:   History of Present Illness  Corey [name] is a 79 year old male presents for his routine visit medication management.  His wife is with him today during the appointment.  He has a few other issues he wants to address.    He has been experiencing pain in his left breast for approximately a week, noting the presence of a lump. Previously, he had a similar issue with the right breast, which improved after discontinuing amlodipine . He has not had a mammogram in over a year.    His hypertension is managed with losartan  25 mg twice daily and metoprolol  half a tablet daily. No chest pain.    He is on atorvastatin  40 mg and Zetia  for hyperlipidemia management but has not had recent cardiology follow-up or testing.    He continues to smoke and uses a CPAP machine for sleep apnea. No issues with urination and is on two medications for this.  He recently saw Urology.  For gastroesophageal reflux, he uses Tums occasionally and is on famotidine  twice daily. He has stopped taking omeprazole .    He is taking calcium and vitamin D  supplements. He recently fell due to slipping on water from a cat's bowl but did not sustain any injuries. He typically uses a cane when outside.    He reports short-term memory loss, often forgetting tasks he intended to do. There is no family history of dementia or Alzheimer's, though his father had brain cancer attributed to occupational exposure to flour.    He has developed a rash around his  neck, which he attributes to wearing turtlenecks. He has been applying Vaseline and a Vicks vapor rub ointment, but the condition  persists. He has been seeing a dermatologist for facial skin issues.       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     CT LUNG SCREENING LDCT - There is paraseptal emphysematous change at the lung apices which is considered mild (Imaging)      Degenerative disease of nervous system, unspecified 04/14/2022     CT BRAIN WO IV CONTRAST - CSF Spaces Mild generalized cerebral atrophy (Imaging)      Coronary arteriosclerosis 03/15/2022    History of constipation 03/15/2022    Positive colorectal cancer screening using Cologuard test 03/15/2022    Skin cancer 12/21/2021    Low back pain 12/21/2021    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    Hypertensive  disorder 12/21/2021    Anxiety 12/21/2021    Osteoarthritis 12/21/2021    Vitamin D  deficiency, unspecified 12/21/2021    Benign prostatic hyperplasia 12/21/2021    Current smoker 12/21/2021    Hyperlipidemia 08/29/2021    Heart murmur 08/29/2021    Obstructive sleep apnea syndrome 08/29/2021    Right carotid bruit 08/29/2021    Gastroesophageal reflux disease 08/29/2021    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     [facility]  Dr Skipper      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       Historical Data    Past Medical History:  Past Medical History:   Diagnosis Date    Abnormal laboratory test     Abnormal prostate specific antigen     Anxiety     Arteriosclerotic vascular disease     Atypical chest pain     BPH  (benign prostatic hyperplasia)     Cancer (CMS HCC)     skin    Coronary artery disease     Depression     Diarrhea     Esophageal reflux     Fatigue 08/29/2021    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    [place]  Dr Skipper    Hypercholesterolemia     Hypertension     IBS (irritable bowel syndrome)     Left foot pain 12/21/2021    Leukocytosis     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    Narcolepsy     Neck pain 12/21/2021    Nephritis 12/21/2021    OSA (obstructive sleep apnea)     Osteoarthritis     Paresthesia 12/21/2021    Previous back surgery 12/21/2021    Right carotid bruit     Right hip pain 12/21/2021    Thigh pain 12/21/2021    Thyroid  nodule     Weight loss, non-intentional 12/21/2021     Past Surgical History:  Past Surgical History:   Procedure Laterality Date    CARDIAC CATHETERIZATION  07/13/2021    [id number]    Dr Garnette Ward    DENTAL SURGERY      HX BACK SURGERY      HX COLONOSCOPY  2019    had done at [facility]    HX CORONARY ARTERY BYPASS GRAFT  08/03/2021    Dr Skipper [facility]    HX HERNIA REPAIR      HX LAP CHOLECYSTECTOMY      ORTHOPEDIC SURGERY      PROSTATE SURGERY  10/2022    Dr. Emilio     Allergies:  Allergies[1]  Medications:  Current Outpatient Medications   Medication Sig    aspirin  (ECOTRIN) 81 mg Oral Tablet, Delayed Release (E.C.) Take 1 Tablet (81 mg total) by mouth Daily    atorvastatin  (LIPITOR) 40 mg Oral Tablet Take 1 Tablet (40 mg total) by mouth Daily    benztropine  (COGENTIN ) 1 mg Oral Tablet Take 1 Tablet (1 mg total) by mouth Daily    betamethasone  valerate (VALISONE ) 0.1 % Cream Apply topically Twice daily for 14 days Apply sparingly to rash around neck for 2 weeks.    calcium carbonate-vitamin D3 (CALTRATE 600 PLUS D) 600 mg-20 mcg (800 unit) Oral Tablet, Chewable Chew 1 Tablet Daily for 90 days Indications: prevention of a low amount of calcium in the blood    coffee/theanine/superoxide dis (NEURIVA DE-STRESS ORAL) Take 1 Capsule by  mouth Daily  colchicine  0.6 mg Oral Tablet Take 1 Tablet (0.6 mg total) by mouth Twice daily    cyclobenzaprine  (FLEXERIL ) 10 mg Oral Tablet Take 1 Tablet (10 mg total) by mouth Every 8 hours as needed    denosumab  (PROLIA ) 60 mg/mL Subcutaneous Syringe subQ injection Inject 1 mL (60 mg total) under the skin Every 6 months Indications: osteoporosis in male patient    ezetimibe  (ZETIA ) 10 mg Oral Tablet Take 1 Tablet (10 mg total) by mouth Every evening Indications: High Trigylcerides.    famotidine  (PEPCID ) 40 mg Oral Tablet Take 1 Tablet (40 mg total) by mouth Twice daily for 180 days    losartan  (COZAAR ) 25 mg Oral Tablet Take 1 Tablet (25 mg total) by mouth Twice daily Indications: high blood pressure    metoprolol  succinate (TOPROL -XL) 25 mg Oral Tablet Sustained Release 24 hr Take 0.5 Tablets (12.5 mg total) by mouth Daily Wean to 1/2 tablet, take whole tablet if heart rate is greater than 100    mirabegron  (MYRBETRIQ ) 25 mg Oral Tablet Sustained Release 24 hr Take 1 Tablet (25 mg total) by mouth Daily    nitroGLYCERIN  (NITROSTAT ) 0.4 mg Sublingual Tablet, Sublingual DISSOLVE 1 TAB UNDER TONGUE EVERY 5 MINUTES AS NEEDED FOR CHEST PAIN. DO NOT EXCEED 3 DOSES IN 15 MINUTES.    polyethylene glycol (MIRALAX ) 17 gram Oral Powder in Packet Take 1 Packet (17 g total) by mouth Daily    sertraline  (ZOLOFT ) 100 mg Oral Tablet Take 1 Tablet (100 mg total) by mouth Every night Indications: repeated episodes of anxiety    tamsulosin  (FLOMAX ) 0.4 mg Oral Capsule Take 1 Capsule (0.4 mg total) by mouth Every evening after dinner Indications: enlarged prostate with urination problem    traMADoL  (ULTRAM ) 50 mg Oral Tablet Take 1 Tablet (50 mg total) by mouth Every 6 hours as needed for Pain for up to 120 doses Indications: pain    vitamin B complex Oral Tablet Take 1 Tablet by mouth Daily     Family History:  Family Medical History:       Problem Relation (Age of Onset)    Brain cancer Father    Heart Disease Mother     Hypertension (High Blood Pressure) Mother    No Known Problems Sister, Brother, Maternal Grandmother, Maternal Grandfather, Paternal Grandmother, Paternal Grandfather, Daughter, Son, Maternal Aunt, Maternal Uncle, Paternal Aunt, Paternal Uncle, Half-Brother, Half-Sister, Other            Social History:  Social History     Socioeconomic History    Marital status: Married     Spouse name: Sherrilyn    Number of children: 2    Years of education: 12+    Highest education level: Bachelor's degree (e.g., BA, AB, BS)   Tobacco Use    Smoking status: Every Day     Current packs/day: 1.00     Average packs/day: 1 pack/day for 57.1 years (57.1 ttl pk-yrs)     Types: Cigarettes     Start date: 1969    Smokeless tobacco: Never    Tobacco comments:     Down to half a pack a day   Vaping Use    Vaping status: Never Used   Substance and Sexual Activity    Alcohol  use: Yes     Alcohol /week: 3.0 standard drinks of alcohol      Types: 3 Cans of beer per week     Comment: occasionally    Drug use: Never     Social Determinants  of Health     Financial Resource Strain: Low Risk (06/15/2022)    Financial Resource Strain     SDOH Financial: No   Transportation Needs: Low Risk (06/15/2022)    Transportation Needs     SDOH Transportation: No   Social Connections: Low Risk (12/12/2023)    Social Connections     SDOH Social Isolation: 5 or more times a week   Intimate Partner Violence: Low Risk (06/15/2022)    Intimate Partner Violence     SDOH Domestic Violence: No   Housing Stability: Low Risk (06/15/2022)    Housing Stability     SDOH Housing Situation: I have housing.     SDOH Housing Worry: No           Review of Systems:  Any pertinent Review of Systems as addressed in the HPI above.    Physical Exam:  Vital Signs:  Vitals:    07/03/24 1505   BP: 120/62   Pulse: 64   Resp: 17   Temp: 36.4 C (97.6 F)   TempSrc: Temporal   SpO2: 95%   Weight: 75.4 kg (166 lb 2 oz)   Height: 1.702 m (5' 7)   BMI: 26.02     Physical Exam  Vitals reviewed.    Constitutional:       Appearance: Normal appearance. He is well-groomed and normal weight. He is not ill-appearing.   HENT:      Head: Normocephalic and atraumatic.      Right Ear: Hearing normal.      Left Ear: Hearing normal.      Nose: Nose normal.      Mouth/Throat:      Lips: Pink.      Mouth: Mucous membranes are moist.   Eyes:      General: No scleral icterus.     Conjunctiva/sclera: Conjunctivae normal.   Cardiovascular:      Rate and Rhythm: Normal rate and regular rhythm.      Pulses: Normal pulses.      Heart sounds: S1 normal and S2 normal. Murmur heard.      Systolic murmur is present with a grade of 3/6.   Pulmonary:      Effort: Pulmonary effort is normal.      Breath sounds: Normal breath sounds and air entry. No decreased breath sounds, wheezing, rhonchi or rales.   Chest:      Chest wall: No tenderness.   Breasts:     Right: Mass and tenderness present.   Abdominal:      Palpations: Abdomen is soft.   Musculoskeletal:         General: No swelling.      Cervical back: Neck supple.      Right lower leg: No edema.      Left lower leg: No edema.   Skin:     General: Skin is warm.      Coloration: Skin is not cyanotic or pale.      Nails: There is no clubbing.   Neurological:      Mental Status: He is alert and oriented to person, place, and time. Mental status is at baseline.   Psychiatric:         Behavior: Behavior is cooperative.         Results:  Lab Results   Component Value Date    VITAMIND25 46.37 04/02/2024    VITAMIND25 42.88 01/02/2024    VITAMIND25 36.40 10/08/2023      CBC  Diff   Lab Results   Component Value Date/Time    WBC 9.2 04/02/2024 01:42 PM    HGB 14.2 04/02/2024 01:42 PM    HCT 41.4 04/02/2024 01:42 PM    PLTCNT 293 04/02/2024 01:42 PM    RBC 4.59 04/02/2024 01:42 PM    MCV 90.3 04/02/2024 01:42 PM    MCHC 34.3 04/02/2024 01:42 PM    MCH 30.9 04/02/2024 01:42 PM    RDW 14.3 04/02/2024 01:42 PM    MPV 9.6 04/02/2024 01:42 PM    Lab Results   Component Value Date/Time    PMNS 58  04/02/2024 01:42 PM    LYMPHOCYTES 33 04/02/2024 01:42 PM    EOSINOPHIL 1 04/02/2024 01:42 PM    MONOCYTES 7 04/02/2024 01:42 PM    BASOPHILS 1 04/02/2024 01:42 PM    BASOPHILS 0.10 04/02/2024 01:42 PM    PMNABS 5.40 04/02/2024 01:42 PM    LYMPHSABS 3.00 04/02/2024 01:42 PM    EOSABS 0.10 04/02/2024 01:42 PM    MONOSABS 0.70 04/02/2024 01:42 PM           COMPREHENSIVE METABOLIC PANEL  Lab Results   Component Value Date    SODIUM 141 04/02/2024    POTASSIUM 4.2 04/02/2024    CHLORIDE 107 04/02/2024    CO2 27 04/02/2024    ANIONGAP 7 04/02/2024    BUN 14 04/02/2024    CREATININE 1.08 04/02/2024    GLUCOSENF 74 04/02/2024    CALCIUM 9.3 04/02/2024    ALBUMIN 4.5 04/02/2024    TOTALPROTEIN 7.4 04/02/2024    ALKPHOS 89 04/02/2024    AST 24 04/02/2024    ALT 21 04/02/2024    GFR 71 04/02/2024                 Lab Results   Component Value Date    CHOLESTEROL 109 04/02/2024    TRIG 245 (H) 04/02/2024    HDLCHOL 22 (L) 04/02/2024    LDLCHOL 38 04/02/2024    VLDLCAL 49 04/02/2024    CHOLHDLRATIO 5.0 04/02/2024      Lab Results   Component Value Date    TSH 1.546 04/02/2024     Lab Results   Component Value Date    HA1C 5.3 04/02/2024            ICD-10-CM    1. Primary hypertension  I10 CBC/DIFF     COMPREHENSIVE METABOLIC PANEL, NON-FASTING     LIPID PANEL     VITAMIN D  25 TOTAL      2. Hyperlipidemia, unspecified hyperlipidemia type  E78.5 CBC/DIFF     COMPREHENSIVE METABOLIC PANEL, NON-FASTING     LIPID PANEL     VITAMIN D  25 TOTAL      3. Other emphysema  J43.8 CBC/DIFF     COMPREHENSIVE METABOLIC PANEL, NON-FASTING     LIPID PANEL     VITAMIN D  25 TOTAL      4. Obstructive sleep apnea syndrome  G47.33 CBC/DIFF     COMPREHENSIVE METABOLIC PANEL, NON-FASTING     LIPID PANEL     VITAMIN D  25 TOTAL      5. Current smoker  F17.200 CBC/DIFF     COMPREHENSIVE METABOLIC PANEL, NON-FASTING     LIPID PANEL     VITAMIN D  25 TOTAL      6. Gastroesophageal reflux disease without esophagitis  K21.9 CBC/DIFF     COMPREHENSIVE METABOLIC  PANEL, NON-FASTING     LIPID PANEL  VITAMIN D  25 TOTAL      7. Vitamin D  deficiency, unspecified  E55.9 CBC/DIFF     COMPREHENSIVE METABOLIC PANEL, NON-FASTING     LIPID PANEL     VITAMIN D  25 TOTAL      8. Osteoporosis with current pathological fracture  M80.00XA CBC/DIFF     COMPREHENSIVE METABOLIC PANEL, NON-FASTING     LIPID PANEL     VITAMIN D  25 TOTAL      9. Benign prostatic hyperplasia, unspecified whether lower urinary tract symptoms present  N40.0 CBC/DIFF     COMPREHENSIVE METABOLIC PANEL, NON-FASTING     LIPID PANEL     VITAMIN D  25 TOTAL      10. Generalized anxiety disorder  F41.1 CBC/DIFF     COMPREHENSIVE METABOLIC PANEL, NON-FASTING     LIPID PANEL     VITAMIN D  25 TOTAL      11. Gynecomastia, male  N4 CBC/DIFF     COMPREHENSIVE METABOLIC PANEL, NON-FASTING     LIPID PANEL     VITAMIN D  25 TOTAL     MAMMO BILATERAL DIAGNOSTIC-ADDL VIEWS/BREAST US  AS REQ BY RAD      12. Short-term memory loss  R41.3 CBC/DIFF     COMPREHENSIVE METABOLIC PANEL, NON-FASTING     LIPID PANEL     VITAMIN D  25 TOTAL     Referral to NEUROLOGY - [place] - [facility]           Assessment/Plan:  Assessment & Plan  Breast lump  New left breast lump with pain and soreness, present for a week. Previous mammogram was March 14, 2023  Previous gynecomastia resolved with medications changes.  - Ordered mammogram for comparison with previous imaging.  -  Will refer based on results and continued symptom.  - Will check a CBC    Primary hypertension  Blood pressure well-controlled on losartan  and metoprolol . Pulse rate 64 bpm.  - CBC and a CMP    Hyperlipidemia  Continues atorvastatin  40 mg and Zetia  10 mg daily  - Fasting lipid    Osteoporosis  Previous hip fracture  Due for Prolia  injection February 19th. Calcium level needs checking before administration.  - Check calcium level before Prolia  injection.  - Administer Prolia  after confirming normal calcium level.  - Continue his daily calcium supplement    Obstructive sleep  apnea  Continues CPAP use.    Nicotine  dependence  Continues to smoke.  Absolutely no desire to stop.    Gastroesophageal reflux disease  Infrequent reflux symptoms managed with Tums. Omeprazole  discontinued.  Now managed on famotidine  40 mg twice a day.  - Continue medication    Vitamin D  deficiency  Continues vitamin D  supplement with calcium.    Benign prostatic hyperplasia  No recent changes in urinary symptoms. Continues current medications.  - Recently saw Urology    Generalized anxiety  Well controlled on Zoloft   - Continue Zoloft  100 mg nightly.    He also take Cogentin  daily.  This is not dispensed by me, he receives his medications from his daughter-in-law who is a PA.  This is for grinding his teeth, and he has been on it for years.     Memory loss  Reports short-term memory loss. 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  Mini-mental was performed in October.  Scored a 22/30  -  He is agreeable for a Referred to neurology for evaluation of memory loss.    Dermatitis  Rash noted  around base of the neck, possibly from turtleneck and reaction to fibers. No itching.  Recently saw dermatology treatment for facial lesions ( Actinic keratosis vs basal cell carcinoma). Per patient dermatology did not address the rash.  - Prescribed high potency steroid cream for neck.  - Advised soap and water use, followed by mild lotion if needed.  - Instructed to follow up with dermatology if condition worsens.  - request dermatology records.      Will return around February 17 for lab, just prior to his next Prolia  injection.        Orders Placed This Encounter    MAMMO BILATERAL DIAGNOSTIC-ADDL VIEWS/BREAST US  AS REQ BY RAD    CBC/DIFF    COMPREHENSIVE METABOLIC PANEL, NON-FASTING    LIPID PANEL    VITAMIN D  25 TOTAL    Referral to NEUROLOGY - [place] [facility]    atorvastatin  (LIPITOR) 40 mg Oral Tablet    losartan  (COZAAR ) 25 mg Oral Tablet    tamsulosin  (FLOMAX ) 0.4  mg Oral Capsule    metoprolol  succinate (TOPROL -XL) 25 mg Oral Tablet Sustained Release 24 hr    calcium carbonate-vitamin D3 (CALTRATE 600 PLUS D) 600 mg-20 mcg (800 unit) Oral Tablet, Chewable    famotidine  (PEPCID ) 40 mg Oral Tablet    ezetimibe  (ZETIA ) 10 mg Oral Tablet    sertraline  (ZOLOFT ) 100 mg Oral Tablet    betamethasone  valerate (VALISONE ) 0.1 % Cream         Return in about 3 months (around 10/01/2024) for Chronic Disease Management.      Torah Pinnock L Karter Haire APRN, FNP-C    This note was created with assistance from [employer] via capture of conversational audio.  Consent was obtained from the patient prior to recording.      Portions of this note may be dictated using voice recognition software or a dictation service. Variances in spelling and vocabulary are possible and unintentional. Not all errors are caught/corrected. Please notify the dino if any discrepancies are noted or if the meaning of any statement is not clear.        [1]   Allergies  Allergen Reactions    Penicillins  Other Adverse Reaction (Add comment) and Rash     Childhood allergy, unknown reaction

## 2024-07-09 ENCOUNTER — Encounter (RURAL_HEALTH_CENTER): Payer: Self-pay | Admitting: Family

## 2024-07-23 ENCOUNTER — Ambulatory Visit

## 2024-07-31 ENCOUNTER — Ambulatory Visit (RURAL_HEALTH_CENTER): Payer: Self-pay

## 2024-10-01 ENCOUNTER — Ambulatory Visit (RURAL_HEALTH_CENTER): Payer: Self-pay | Admitting: Family

## 2024-10-08 ENCOUNTER — Ambulatory Visit (INDEPENDENT_AMBULATORY_CARE_PROVIDER_SITE_OTHER): Payer: Self-pay | Admitting: NEUROLOGY
